# Patient Record
Sex: Male | Born: 1980
Health system: Southern US, Community
[De-identification: ages and names within clinical notes are randomized; demographics above are authoritative.]

## PROBLEM LIST (undated history)

## (undated) DIAGNOSIS — J45909 Unspecified asthma, uncomplicated: Secondary | ICD-10-CM

## (undated) DIAGNOSIS — T7840XA Allergy, unspecified, initial encounter: Secondary | ICD-10-CM

## (undated) HISTORY — PX: CYST REMOVAL TRUNK: SHX6283

## (undated) HISTORY — DX: Unspecified asthma, uncomplicated: J45.909

## (undated) HISTORY — DX: Allergy, unspecified, initial encounter: T78.40XA

---
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
## 1999-05-02 ENCOUNTER — Emergency Department (HOSPITAL_COMMUNITY): Admission: EM | Admit: 1999-05-02 | Discharge: 1999-05-02 | Payer: Self-pay | Admitting: *Deleted

## 2002-03-29 ENCOUNTER — Emergency Department (HOSPITAL_COMMUNITY): Admission: EM | Admit: 2002-03-29 | Discharge: 2002-03-29 | Payer: Self-pay | Admitting: *Deleted

## 2002-03-29 ENCOUNTER — Encounter: Payer: Self-pay | Admitting: Emergency Medicine

## 2006-04-19 ENCOUNTER — Encounter: Payer: Self-pay | Admitting: Emergency Medicine

## 2006-10-06 ENCOUNTER — Emergency Department (HOSPITAL_COMMUNITY): Admission: EM | Admit: 2006-10-06 | Discharge: 2006-10-06 | Payer: Self-pay | Admitting: Emergency Medicine

## 2008-05-20 ENCOUNTER — Emergency Department (HOSPITAL_COMMUNITY): Admission: EM | Admit: 2008-05-20 | Discharge: 2008-05-21 | Payer: Self-pay | Admitting: Emergency Medicine

## 2009-05-23 ENCOUNTER — Emergency Department (HOSPITAL_COMMUNITY): Admission: EM | Admit: 2009-05-23 | Discharge: 2009-05-24 | Payer: Self-pay | Admitting: Emergency Medicine

## 2013-12-25 ENCOUNTER — Ambulatory Visit: Payer: No Typology Code available for payment source | Admitting: Family Medicine

## 2013-12-25 VITALS — BP 120/80 | HR 79 | Temp 97.8°F | Resp 16 | Ht 66.75 in | Wt 225.0 lb

## 2013-12-25 DIAGNOSIS — J029 Acute pharyngitis, unspecified: Secondary | ICD-10-CM

## 2013-12-25 LAB — POCT CBC
Granulocyte percent: 76.6 %G (ref 37–80)
HCT, POC: 46.1 % (ref 43.5–53.7)
Hemoglobin: 15.1 g/dL (ref 14.1–18.1)
Lymph, poc: 2.6 (ref 0.6–3.4)
MCH, POC: 31.9 pg — AB (ref 27–31.2)
MCHC: 32.8 g/dL (ref 31.8–35.4)
MCV: 97.4 fL — AB (ref 80–97)
MID (cbc): 0.9 (ref 0–0.9)
MPV: 8.4 fL (ref 0–99.8)
POC Granulocyte: 11.5 — AB (ref 2–6.9)
POC LYMPH PERCENT: 17.4 %L (ref 10–50)
POC MID %: 6 %M (ref 0–12)
Platelet Count, POC: 226 10*3/uL (ref 142–424)
RBC: 4.73 M/uL (ref 4.69–6.13)
RDW, POC: 12.8 %
WBC: 15 10*3/uL — AB (ref 4.6–10.2)

## 2013-12-25 LAB — POCT RAPID STREP A (OFFICE): Rapid Strep A Screen: NEGATIVE

## 2013-12-25 MED ORDER — AMOXICILLIN 500 MG PO CAPS: 500.0000 mg | ORAL_CAPSULE | Freq: Three times a day (TID) | ORAL | Status: DC

## 2013-12-25 NOTE — Patient Instructions (Signed)
Sore Throat A sore throat is pain, burning, irritation, or scratchiness of the throat. There is often pain or tenderness when swallowing or talking. A sore throat may be accompanied by other symptoms, such as coughing, sneezing, fever, and swollen neck glands. A sore throat is often the first sign of another sickness, such as a cold, flu, strep throat, or mononucleosis (commonly known as mono). Most sore throats go away without medical treatment. CAUSES  The most common causes of a sore throat include:  A viral infection, such as a cold, flu, or mono.  A bacterial infection, such as strep throat, tonsillitis, or whooping cough.  Seasonal allergies.  Dryness in the air.  Irritants, such as smoke or pollution.  Gastroesophageal reflux disease (GERD). HOME CARE INSTRUCTIONS   Only take over-the-counter medicines as directed by your caregiver.  Drink enough fluids to keep your urine clear or pale yellow.  Rest as needed.  Try using throat sprays, lozenges, or sucking on hard candy to ease any pain (if older than 4 years or as directed).  Sip warm liquids, such as broth, herbal tea, or warm water with honey to relieve pain temporarily. You may also eat or drink cold or frozen liquids such as frozen ice pops.  Gargle with salt water (mix 1 tsp salt with 8 oz of water).  Do not smoke and avoid secondhand smoke.  Put a cool-mist humidifier in your bedroom at night to moisten the air. You can also turn on a hot shower and sit in the bathroom with the door closed for 5 10 minutes. SEEK IMMEDIATE MEDICAL CARE IF:  You have difficulty breathing.  You are unable to swallow fluids, soft foods, or your saliva.  You have increased swelling in the throat.  Your sore throat does not get better in 7 days.  You have nausea and vomiting.  You have a fever or persistent symptoms for more than 2 3 days.  You have a fever and your symptoms suddenly get worse. MAKE SURE YOU:   Understand  these instructions.  Will watch your condition.  Will get help right away if you are not doing well or get worse. Document Released: 01/12/2005 Document Revised: 11/21/2012 Document Reviewed: 08/12/2012 ExitCare Patient Information 2014 [employer].  

## 2013-12-25 NOTE — Progress Notes (Signed)
Subjective:    Patient ID: Mark Duarte, male    DOB: July 30, 1981, 33 y.o.   MRN: [id number]  Chief Complaint  Patient presents with  . Sore Throat    started today  . Fatigue  . Sinus Problem   HPI  This chart was scribed for [employer]. Katrinka Blazing, MD, by Ellin Mayhew, ED Scribe. This patient was seen in room 03 and the patient's care was started at 6:55 PM.  HPI Comments: Mark Duarte is a 33 y.o. male who presents to the [facility] complaining of sore throat diffusely with difficulty swallowing and speaking that began at 4:30AM this morning. Patient states feeling a "scratchy" sensation in his throat when attempting to speak. He reports associated congestion, myalgias, and HA. Patient denies any fever, chills, hot flashes, ear pain, nausea, vomiting, or diarrhea. He further denies any cough, SOB, or rhinorrhea. Patient has visited his parents recently who were sick the week before. Patient has a history of seasonal allergies; however, has not recently had a flare up and does not currently take any medications for his allergies. Patient took Mucinex with no relief. He is a former smoker.  Past Medical History  Diagnosis Date  . Asthma   . Allergy     Past Surgical History  Procedure Laterality Date  . Cyst removal trunk      Family History  Problem Relation Age of Onset  . Diabetes Mother   . Hypertension Mother   . High Cholesterol Mother   . Hypertension Father   . High Cholesterol Father   . Cancer Maternal Grandmother   . Heart disease Maternal Grandfather   . Diabetes Maternal Grandfather   . High Cholesterol Maternal Grandfather   . Stroke Maternal Grandfather   . Mental illness Maternal Grandfather     History   Social History  . Marital Status: Single    Spouse Name: N/A    Number of Children: N/A  . Years of Education: N/A   Occupational History  . Not on file.   Social History Main Topics  . Smoking status: Former Games developer  .  Smokeless tobacco: Not on file  . Alcohol Use: 2.5 oz/week    5 drink(s) per week  . Drug Use: No  . Sexual Activity: Not on file   Other Topics Concern  . Not on file   Social History Narrative  . No narrative on file   No Known Allergies  There are no active problems to display for this patient.  No current outpatient prescriptions on file prior to visit.   No current facility-administered medications on file prior to visit.    BP 120/80  Pulse 79  Temp(Src) 97.8 F (36.6 C) (Oral)  Resp 16  Ht 5' 6.75" (1.695 m)  Wt 225 lb (102.059 kg)  BMI 35.52 kg/m2  SpO2 97%  Review of Systems  Constitutional: Positive for fatigue. Negative for fever, chills, diaphoresis, activity change and appetite change.  HENT: Positive for congestion, sinus pressure, sore throat and trouble swallowing. Negative for ear pain.   Eyes: Negative.   Respiratory: Negative for cough and shortness of breath.   Gastrointestinal: Negative for nausea, vomiting, diarrhea and constipation.  Musculoskeletal: Positive for myalgias.  Skin: Negative.   Neurological: Positive for headaches. Negative for dizziness, weakness and light-headedness.   Objective:   Physical Exam  Nursing note and vitals reviewed. Constitutional: He is oriented to person, place, and time. He appears well-developed and well-nourished.  No distress.  HENT:  Head: Normocephalic and atraumatic.  Right Ear: Tympanic membrane and external ear normal.  Left Ear: Tympanic membrane and external ear normal.  Mouth/Throat: Posterior oropharyngeal erythema present. No oropharyngeal exudate.  Diffuse oropharyngeal erythema  Eyes: EOM are normal.  Neck: Neck supple. No tracheal deviation present.  Cardiovascular: Normal rate, regular rhythm and normal heart sounds.   Pulmonary/Chest: Effort normal and breath sounds normal. No respiratory distress.  Musculoskeletal: Normal range of motion.  Lymphadenopathy:    He has cervical adenopathy  (anterior and posterior).  Neurological: He is alert and oriented to person, place, and time.  Skin: Skin is warm and dry.  Psychiatric: He has a normal mood and affect. His behavior is normal.    Results for orders placed in visit on 12/25/13  POCT CBC      Result Value Range   WBC 15.0 (*) 4.6 - 10.2 K/uL   Lymph, poc 2.6  0.6 - 3.4   POC LYMPH PERCENT 17.4  10 - 50 %L   MID (cbc) 0.9  0 - 0.9   POC MID % 6.0  0 - 12 %M   POC Granulocyte 11.5 (*) 2 - 6.9   Granulocyte percent 76.6  37 - 80 %G   RBC 4.73  4.69 - 6.13 M/uL   Hemoglobin 15.1  14.1 - 18.1 g/dL   HCT, POC 45.446.1  09.843.5 - 53.7 %   MCV 97.4 (*) 80 - 97 fL   MCH, POC 31.9 (*) 27 - 31.2 pg   MCHC 32.8  31.8 - 35.4 g/dL   RDW, POC 11.912.8     Platelet Count, POC 226  142 - 424 K/uL   MPV 8.4  0 - 99.8 fL  POCT RAPID STREP A (OFFICE)      Result Value Range   Rapid Strep A Screen Negative  Negative    Assessment & Plan:  Acute pharyngitis - Plan: POCT CBC, POCT rapid strep A  1.  Sore throat:  New.  Treat with Amoxicillin 500mg  tid; recommend Ibuprofen 800mg  tid prn. RTC inability to swallow.  I personally performed the services described in this documentation, which was scribed in my presence.  The recorded information has been reviewed and is accurate.  Nilda SimmerKristi Smith, M.D.  [facility]  [facility] [address] [place], [place]  [address] [phone] phone [phone] fax

## 2014-03-11 ENCOUNTER — Ambulatory Visit (INDEPENDENT_AMBULATORY_CARE_PROVIDER_SITE_OTHER): Payer: No Typology Code available for payment source | Admitting: Family Medicine

## 2014-03-11 VITALS — BP 110/64 | HR 78 | Temp 97.7°F | Resp 18 | Ht 67.5 in | Wt 223.0 lb

## 2014-03-11 DIAGNOSIS — J029 Acute pharyngitis, unspecified: Secondary | ICD-10-CM

## 2014-03-11 DIAGNOSIS — J069 Acute upper respiratory infection, unspecified: Secondary | ICD-10-CM

## 2014-03-11 DIAGNOSIS — J309 Allergic rhinitis, unspecified: Secondary | ICD-10-CM

## 2014-03-11 LAB — POCT RAPID STREP A (OFFICE): Rapid Strep A Screen: NEGATIVE

## 2014-03-11 MED ORDER — FLUTICASONE PROPIONATE 50 MCG/ACT NA SUSP: 2.0000 | Freq: Every day | NASAL | Status: DC

## 2014-03-11 MED ORDER — PSEUDOEPHEDRINE-GUAIFENESIN ER 60-600 MG PO TB12: 1.0000 | ORAL_TABLET | Freq: Two times a day (BID) | ORAL | Status: DC

## 2014-03-11 NOTE — Progress Notes (Signed)
   Subjective:    Patient ID: Mark Duarte, male    DOB: 1981/01/06, 33 y.o.   MRN: [id number]  HPI Has 3 day history of nasal congestion, sinus pressure. Ears are stopped up and sore throat. Chest feels heavy with deep breath. Took Allegra D with short term relief. Girlfriend who is a Runner, broadcasting/film/videoteacher is also being seen as a patient today with similar symptoms.  Has history of seasonal allergies and childhood asthma. Has not been bothered by asthma symptoms in many years. Quit smoking 6 months ago.    Review of Systems No cough. No fever. No chest pain, no shortness of breath.      Objective:   Physical Exam  Vitals reviewed. Constitutional: He is oriented to person, place, and time. He appears well-developed and well-nourished. No distress.  HENT:  Head: Normocephalic and atraumatic.  Right Ear: Tympanic membrane, external ear and ear canal normal.  Left Ear: Tympanic membrane, external ear and ear canal normal.  Nose: Mucosal edema, rhinorrhea and septal deviation present. Right sinus exhibits no maxillary sinus tenderness and no frontal sinus tenderness. Left sinus exhibits no maxillary sinus tenderness and no frontal sinus tenderness.  Mouth/Throat: Uvula is midline and mucous membranes are normal. Posterior oropharyngeal erythema present. No oropharyngeal exudate, posterior oropharyngeal edema or tonsillar abscesses.  Eyes: Conjunctivae are normal. Right eye exhibits no discharge. Left eye exhibits no discharge. No scleral icterus.  Neck: Normal range of motion. Neck supple.  Cardiovascular: Normal rate, regular rhythm and normal heart sounds.   Pulmonary/Chest: Effort normal and breath sounds normal.  Musculoskeletal: Normal range of motion.  Neurological: He is alert and oriented to person, place, and time.  Skin: Skin is warm and dry. He is not diaphoretic.  Psychiatric: He has a normal mood and affect. His behavior is normal. Judgment and thought content normal.   Results for  orders placed in visit on 03/11/14  POCT RAPID STREP A (OFFICE)      Result Value Ref Range   Rapid Strep A Screen Negative  Negative      Assessment & Plan:  1. Sore throat - POCT rapid strep A- negative - Throat culture ([facility]) -ibuprofen 600 mg q8 hours prn 2. Upper respiratory infection - Throat culture ([facility]) - pseudoephedrine-guaifenesin (MUCINEX D) 60-600 MG per tablet; Take 1 tablet by mouth every 12 (twelve) hours.  Dispense: 28 tablet; Refill: 1  3. Allergic rhinitis - fluticasone (FLONASE) 50 MCG/ACT nasal spray; Place 2 sprays into both nostrils daily.  Dispense: 16 g; Refill: 3  -Written and verbal instructions provided regarding negative strep test, diagnoses, symptomatig  Emi Belfasteborah B. Delontae Lamm, FNP-BC  [facility], [facility]  03/11/2014 12:04 PM   Patient discussed with Ms. Leone PayorGessner, NP agree with above.    Eula Listenyan Dunn, MHS, PA-C [facility] [address] [place], [place] [address] [phone] [facility] 03/11/2014 1:10 PM

## 2014-03-11 NOTE — Patient Instructions (Signed)
Ibuprofen 400 mg every 8 hours as needed for pain Flonase nasal spray- 2 sprays in each nostril twice a day for 1 week then once a day during your allergy season Mucinex D every 12 hours for congestion Allergic Rhinitis Allergic rhinitis is when the mucous membranes in the nose respond to allergens. Allergens are particles in the air that cause your body to have an allergic reaction. This causes you to release allergic antibodies. Through a chain of events, these eventually cause you to release histamine into the blood stream. Although meant to protect the body, it is this release of histamine that causes your discomfort, such as frequent sneezing, congestion, and an itchy, runny nose.  CAUSES  Seasonal allergic rhinitis (hay fever) is caused by pollen allergens that may come from grasses, trees, and weeds. Year-round allergic rhinitis (perennial allergic rhinitis) is caused by allergens such as house dust mites, pet dander, and mold spores.  SYMPTOMS   Nasal stuffiness (congestion).  Itchy, runny nose with sneezing and tearing of the eyes. DIAGNOSIS  Your health care provider can help you determine the allergen or allergens that trigger your symptoms. If you and your health care provider are unable to determine the allergen, skin or blood testing may be used. TREATMENT  Allergic Rhinitis does not have a cure, but it can be controlled by:  Medicines and allergy shots (immunotherapy).  Avoiding the allergen. Hay fever may often be treated with antihistamines in pill or nasal spray forms. Antihistamines block the effects of histamine. There are over-the-counter medicines that may help with nasal congestion and swelling around the eyes. Check with your health care provider before taking or giving this medicine.  If avoiding the allergen or the medicine prescribed do not work, there are many new medicines your health care provider can prescribe. Stronger medicine may be used if initial measures are  ineffective. Desensitizing injections can be used if medicine and avoidance does not work. Desensitization is when a patient is given ongoing shots until the body becomes less sensitive to the allergen. Make sure you follow up with your health care provider if problems continue. HOME CARE INSTRUCTIONS It is not possible to completely avoid allergens, but you can reduce your symptoms by taking steps to limit your exposure to them. It helps to know exactly what you are allergic to so that you can avoid your specific triggers. SEEK MEDICAL CARE IF:   You have a fever.  You develop a cough that does not stop easily (persistent).  You have shortness of breath.  You start wheezing.  Symptoms interfere with normal daily activities. Document Released: 08/30/2001 Document Revised: 09/25/2013 Document Reviewed: 08/12/2013 [facility] Patient Information 2014 [place], [place].

## 2014-03-13 LAB — CULTURE, GROUP A STREP: Organism ID, Bacteria: NORMAL

## 2014-07-29 ENCOUNTER — Encounter (HOSPITAL_COMMUNITY): Payer: Self-pay | Admitting: Emergency Medicine

## 2014-07-29 ENCOUNTER — Emergency Department (HOSPITAL_COMMUNITY)
Admission: EM | Admit: 2014-07-29 | Discharge: 2014-07-29 | Disposition: A | Discharge status: Home / Self Care | Payer: No Typology Code available for payment source | Attending: Emergency Medicine | Admitting: Emergency Medicine

## 2014-07-29 ENCOUNTER — Emergency Department (HOSPITAL_COMMUNITY): Payer: No Typology Code available for payment source

## 2014-07-29 DIAGNOSIS — S93409A Sprain of unspecified ligament of unspecified ankle, initial encounter: Secondary | ICD-10-CM | POA: Diagnosis not present

## 2014-07-29 DIAGNOSIS — IMO0002 Reserved for concepts with insufficient information to code with codable children: Secondary | ICD-10-CM | POA: Insufficient documentation

## 2014-07-29 DIAGNOSIS — J45909 Unspecified asthma, uncomplicated: Secondary | ICD-10-CM | POA: Diagnosis not present

## 2014-07-29 DIAGNOSIS — S8990XA Unspecified injury of unspecified lower leg, initial encounter: Secondary | ICD-10-CM | POA: Insufficient documentation

## 2014-07-29 DIAGNOSIS — S99929A Unspecified injury of unspecified foot, initial encounter: Secondary | ICD-10-CM

## 2014-07-29 DIAGNOSIS — Y92009 Unspecified place in unspecified non-institutional (private) residence as the place of occurrence of the external cause: Secondary | ICD-10-CM | POA: Diagnosis not present

## 2014-07-29 DIAGNOSIS — S93401A Sprain of unspecified ligament of right ankle, initial encounter: Secondary | ICD-10-CM

## 2014-07-29 DIAGNOSIS — S99919A Unspecified injury of unspecified ankle, initial encounter: Secondary | ICD-10-CM | POA: Diagnosis present

## 2014-07-29 DIAGNOSIS — Z87891 Personal history of nicotine dependence: Secondary | ICD-10-CM | POA: Insufficient documentation

## 2014-07-29 DIAGNOSIS — Y9301 Activity, walking, marching and hiking: Secondary | ICD-10-CM | POA: Diagnosis not present

## 2014-07-29 DIAGNOSIS — W172XXA Fall into hole, initial encounter: Secondary | ICD-10-CM | POA: Diagnosis not present

## 2014-07-29 MED ORDER — HYDROCODONE-ACETAMINOPHEN 5-325 MG PO TABS
1.0000 | ORAL_TABLET | Freq: Once | ORAL | Status: AC
  Administered 2014-07-29: 1 via ORAL
  Filled 2014-07-29: qty 1

## 2014-07-29 MED ORDER — MELOXICAM 15 MG PO TABS: 15.0000 mg | ORAL_TABLET | Freq: Every day | ORAL | Status: DC

## 2014-07-29 NOTE — Discharge Instructions (Signed)
Your x-rays today do not show any new program bones or other concerning injury. At this time your providers feel you have an ankle sprain. Use rest, ice, compression and elevation to reduce pain and swelling. Please followup with a primary care provider or an orthopedic specialist for continued evaluation and treatment.   Ankle Sprain An ankle sprain is an injury to the strong, fibrous tissues (ligaments) that hold the bones of your ankle joint together.  CAUSES An ankle sprain is usually caused by a fall or by twisting your ankle. Ankle sprains most commonly occur when you step on the outer edge of your foot, and your ankle turns inward. People who participate in sports are more prone to these types of injuries.  SYMPTOMS   Pain in your ankle. The pain may be present at rest or only when you are trying to stand or walk.  Swelling.  Bruising. Bruising may develop immediately or within 1 to 2 days after your injury.  Difficulty standing or walking, particularly when turning corners or changing directions. DIAGNOSIS  Your caregiver will ask you details about your injury and perform a physical exam of your ankle to determine if you have an ankle sprain. During the physical exam, your caregiver will press on and apply pressure to specific areas of your foot and ankle. Your caregiver will try to move your ankle in certain ways. An X-ray exam may be done to be sure a bone was not broken or a ligament did not separate from one of the bones in your ankle (avulsion fracture).  TREATMENT  Certain types of braces can help stabilize your ankle. Your caregiver can make a recommendation for this. Your caregiver may recommend the use of medicine for pain. If your sprain is severe, your caregiver may refer you to a surgeon who helps to restore function to parts of your skeletal system (orthopedist) or a physical therapist. HOME CARE INSTRUCTIONS   Apply ice to your injury for 1-2 days or as directed by your  caregiver. Applying ice helps to reduce inflammation and pain.  Put ice in a plastic bag.  Place a towel between your skin and the bag.  Leave the ice on for 15-20 minutes at a time, every 2 hours while you are awake.  Only take over-the-counter or prescription medicines for pain, discomfort, or fever as directed by your caregiver.  Elevate your injured ankle above the level of your heart as much as possible for 2-3 days.  If your caregiver recommends crutches, use them as instructed. Gradually put weight on the affected ankle. Continue to use crutches or a cane until you can walk without feeling pain in your ankle.  If you have a plaster splint, wear the splint as directed by your caregiver. Do not rest it on anything harder than a pillow for the first 24 hours. Do not put weight on it. Do not get it wet. You may take it off to take a shower or bath.  You may have been given an elastic bandage to wear around your ankle to provide support. If the elastic bandage is too tight (you have numbness or tingling in your foot or your foot becomes cold and blue), adjust the bandage to make it comfortable.  If you have an air splint, you may blow more air into it or let air out to make it more comfortable. You may take your splint off at night and before taking a shower or bath. Wiggle your toes in the  splint several times per day to decrease swelling. SEEK MEDICAL CARE IF:   You have rapidly increasing bruising or swelling.  Your toes feel extremely cold or you lose feeling in your foot.  Your pain is not relieved with medicine. SEEK IMMEDIATE MEDICAL CARE IF:  Your toes are numb or blue.  You have severe pain that is increasing. MAKE SURE YOU:   Understand these instructions.  Will watch your condition.  Will get help right away if you are not doing well or get worse. Document Released: 12/05/2005 Document Revised: 08/29/2012 Document Reviewed: 12/17/2011 [facility] Patient Information  2015 [place], [place]. This information is not intended to replace advice given to you by your health care provider. Make sure you discuss any questions you have with your health care provider.

## 2014-07-29 NOTE — ED Notes (Signed)
Pt states he stepped in a hole in his yard and injured his R ankle. Pt has swelling to lateral side of R ankle. Pt states he is unable to bear wt to R ankle. Pt arrives in wheelchair.

## 2014-07-29 NOTE — ED Provider Notes (Signed)
CSN: [id number]     Arrival date & time 07/29/14  2148 History  This chart was scribed for non-physician practitioner working with Suzi Roots, MD by Elveria Rising, ED Scribe. This patient was seen in room WTR7/WTR7 and the patient's care was started at 10:17 PM.   Chief Complaint  Patient presents with  . Ankle Injury    The history is provided by the patient. No language interpreter was used.   HPI Comments: Mark Duarte is a 33 y.o. male who presents to the Emergency Department with a right ankle injury incurred tonight. Injury sustained while walking through his yard, stepping in a hole and rolling his ankle inward. Patient reports audible pop. Patient complaining of the right ankle swelling and pain with radiation to his right knee. Patient reports mild pain at rest, rating it 5/10, but reports exacerbation with bearing weight. Patient reports treatment with ice prior to arrival.   Patient shares several past (right ankle) injuries but nothing severe enough for surgery.    Past Medical History  Diagnosis Date  . Asthma   . Allergy    Past Surgical History  Procedure Laterality Date  . Cyst removal trunk     Family History  Problem Relation Age of Onset  . Diabetes Mother   . Hypertension Mother   . High Cholesterol Mother   . Hypertension Father   . High Cholesterol Father   . Cancer Maternal Grandmother   . Heart disease Maternal Grandfather   . Diabetes Maternal Grandfather   . High Cholesterol Maternal Grandfather   . Stroke Maternal Grandfather   . Mental illness Maternal Grandfather    History  Substance Use Topics  . Smoking status: Former Smoker    Quit date: 10/11/2013  . Smokeless tobacco: Not on file  . Alcohol Use: 2.5 oz/week    5 drink(s) per week    Review of Systems  Constitutional: Negative for fever and chills.  Musculoskeletal: Positive for arthralgias and joint swelling.  All other systems reviewed and are negative.   Allergies   Review of patient's allergies indicates no known allergies.  Home Medications   Prior to Admission medications   Medication Sig Start Date End Date Taking? Authorizing Provider  fluticasone (FLONASE) 50 MCG/ACT nasal spray Place 2 sprays into both nostrils daily. 03/11/14   Emi Belfast, FNP  pseudoephedrine-guaifenesin [facility] D) 60-600 MG per tablet Take 1 tablet by mouth every 12 (twelve) hours. 03/11/14   Emi Belfast, FNP   Triage Vitals: BP 137/68  Pulse 90  Temp(Src) 98.8 F (37.1 C) (Oral)  Resp 17  SpO2 96%  Physical Exam  Nursing note and vitals reviewed. Constitutional: He is oriented to person, place, and time. He appears well-developed and well-nourished.  HENT:  Head: Normocephalic and atraumatic.  Eyes: EOM are normal.  Neck: Neck supple.  Cardiovascular: Normal rate.   Pulmonary/Chest: Effort normal. No respiratory distress.  Musculoskeletal: He exhibits edema and tenderness.  Significant swelling over the right lateral malleolus area and distal fibula. There is tenderness throughout. No obvious gross deformity. No pain over the proximal fifth metatarsal. Normal dorsal pedal pulses and sensation in the foot. No proximal fibular tenderness and normal knee exam.  Neurological: He is alert and oriented to person, place, and time.  Skin: Skin is warm and dry.  Psychiatric: He has a normal mood and affect. His behavior is normal.    ED Course  Procedures  COORDINATION OF CARE: 10:41 PM- Will order  pain medication. Awaiting reading from radiologist. Discussed treatment plan with patient at bedside and patient agreed to plan.    X-rays reviewed. No signs of acute fracture or dislocation. Signs of old remote old and fracture of the distal fibula. Patient does report a history of prior injuries. Do not suspect this is acute. We'll plan to treat with ASO and crutches. Orthopedic referral provided.   Imaging Review Dg Ankle Complete Right  07/29/2014    CLINICAL DATA:  History of trauma from fall.  Right ankle pain.  EXAM: RIGHT ANKLE - COMPLETE 3+ VIEW  COMPARISON:  No priors.  FINDINGS: Three views of the right ankle demonstrate extensive soft tissue swelling overlying the lateral malleolus. There is a small osseous fragment adjacent to the tip of the lateral malleolus, which appears generally well corticated. No other evidence to suggest acute fracture, subluxation or dislocation is noted.  IMPRESSION: 1. Extensive soft tissue swelling overlying the lateral malleolus. 2. Small osseous fragment adjacent to the tip of the lateral malleolus. Since this is well corticated, this is favored to represent sequela of remote old avulsion fracture, however, given the extensive overlying soft tissue swelling, the possibility of an acute avulsion fracture is not excluded.   Electronically Signed   By: Trudie Reedaniel  Entrikin M.D.   On: 07/29/2014 23:05     MDM   Final diagnoses:  Ankle sprain, right, initial encounter      I personally performed the services described in this documentation, which was scribed in my presence. The recorded information has been reviewed and is accurate.    Angus SellerPeter S Daniesha Driver, PA-C 07/29/14 2317

## 2014-07-30 ENCOUNTER — Telehealth (HOSPITAL_BASED_OUTPATIENT_CLINIC_OR_DEPARTMENT_OTHER): Payer: Self-pay | Admitting: Emergency Medicine

## 2014-08-05 NOTE — ED Provider Notes (Signed)
Medical screening examination/treatment/procedure(s) were performed by non-physician practitioner and as supervising physician I was immediately available for consultation/collaboration.     Suzi RootsKevin E Serigne Kubicek, MD 08/05/14 [phone]

## 2014-12-13 ENCOUNTER — Emergency Department (HOSPITAL_COMMUNITY)
Admission: EM | Admit: 2014-12-13 | Discharge: 2014-12-13 | Disposition: A | Discharge status: Home / Self Care | Payer: No Typology Code available for payment source | Source: Home / Self Care | Attending: Family Medicine | Admitting: Family Medicine

## 2014-12-13 ENCOUNTER — Encounter (HOSPITAL_COMMUNITY): Payer: Self-pay | Admitting: Emergency Medicine

## 2014-12-13 DIAGNOSIS — J9801 Acute bronchospasm: Secondary | ICD-10-CM

## 2014-12-13 DIAGNOSIS — J0100 Acute maxillary sinusitis, unspecified: Secondary | ICD-10-CM

## 2014-12-13 DIAGNOSIS — J029 Acute pharyngitis, unspecified: Secondary | ICD-10-CM

## 2014-12-13 LAB — POCT RAPID STREP A: STREPTOCOCCUS, GROUP A SCREEN (DIRECT): NEGATIVE

## 2014-12-13 MED ORDER — FLUTICASONE PROPIONATE 50 MCG/ACT NA SUSP: 2.0000 | Freq: Every day | NASAL | Status: DC

## 2014-12-13 MED ORDER — ALBUTEROL SULFATE HFA 108 (90 BASE) MCG/ACT IN AERS: 2.0000 | INHALATION_SPRAY | Freq: Four times a day (QID) | RESPIRATORY_TRACT | Status: DC | PRN

## 2014-12-13 MED ORDER — IPRATROPIUM BROMIDE 0.06 % NA SOLN: 2.0000 | Freq: Four times a day (QID) | NASAL | Status: DC

## 2014-12-13 MED ORDER — AMOXICILLIN-POT CLAVULANATE 875-125 MG PO TABS: 1.0000 | ORAL_TABLET | Freq: Two times a day (BID) | ORAL | Status: DC

## 2014-12-13 MED ORDER — PREDNISONE 50 MG PO TABS: ORAL_TABLET | ORAL | Status: DC

## 2014-12-13 NOTE — ED Notes (Signed)
Pt states that he has body aches with sore throat that started this morning.

## 2014-12-13 NOTE — ED Provider Notes (Signed)
CSN: [id number]     Arrival date & time 12/13/14  1857 History   First MD Initiated Contact with Patient 12/13/14 2000     Chief Complaint  Patient presents with  . Sore Throat  . Generalized Body Aches   (Consider location/radiation/quality/duration/timing/severity/associated sxs/prior Treatment) HPI   Sore throat. Started last night. Acutely worsened today. Associated w/ aches and chills. Denies nasal congestion. Getting worse. Denies fevers. Has not taken any medications. Multiple possible sick contacts. Decreased PO due to pain.   Past Medical History  Diagnosis Date  . Asthma   . Allergy    Past Surgical History  Procedure Laterality Date  . Cyst removal trunk     Family History  Problem Relation Age of Onset  . Diabetes Mother   . Hypertension Mother   . High Cholesterol Mother   . Hypertension Father   . High Cholesterol Father   . Cancer Maternal Grandmother   . Heart disease Maternal Grandfather   . Diabetes Maternal Grandfather   . High Cholesterol Maternal Grandfather   . Stroke Maternal Grandfather   . Mental illness Maternal Grandfather    History  Substance Use Topics  . Smoking status: Former Smoker    Quit date: 10/11/2013  . Smokeless tobacco: Not on file  . Alcohol Use: 2.5 oz/week    5 Not specified per week    Review of Systems Per HPI with all other pertinent systems negative.   Allergies  Review of patient's allergies indicates no known allergies.  Home Medications   Prior to Admission medications   Medication Sig Start Date End Date Taking? Authorizing Provider  amoxicillin-clavulanate (AUGMENTIN) 875-125 MG per tablet Take 1 tablet by mouth 2 (two) times daily. 12/13/14   Ozella Rocksavid J Merrell, MD  fluticasone (FLONASE) 50 MCG/ACT nasal spray Place 2 sprays into both nostrils at bedtime. 12/13/14   Ozella Rocksavid J Merrell, MD  ibuprofen (ADVIL,MOTRIN) 200 MG tablet Take 400 mg by mouth every 6 (six) hours as needed for moderate pain.    Historical  Provider, MD  ipratropium (ATROVENT) 0.06 % nasal spray Place 2 sprays into both nostrils 4 (four) times daily. 12/13/14   Ozella Rocksavid J Merrell, MD  meloxicam (MOBIC) 15 MG tablet Take 1 tablet (15 mg total) by mouth daily. 07/29/14   Angus SellerPeter S Dammen, PA-C  Multiple Vitamin (MULTIVITAMIN WITH MINERALS) TABS tablet Take 1 tablet by mouth daily.    Historical Provider, MD  predniSONE (DELTASONE) 50 MG tablet Take daily with breakfast 12/13/14   Ozella Rocksavid J Merrell, MD   BP 116/76 mmHg  Pulse 96  Temp(Src) 98.3 F (36.8 C) (Oral)  Resp 16  SpO2 100% Physical Exam  Constitutional: He is oriented to person, place, and time. He appears well-developed and well-nourished. No distress.  HENT:  Head: Normocephalic and atraumatic.  Tonsils 1+ w/o exudate.  Maxillary sinuses ttp Pharyngeal cobblestoning abnd injection  Eyes: EOM are normal. Pupils are equal, round, and reactive to light.  Neck: Normal range of motion.  Cardiovascular: Normal rate and regular rhythm.   Pulmonary/Chest: Effort normal.  Abdominal: Soft. Bowel sounds are normal.  Musculoskeletal: Normal range of motion. He exhibits no edema.  Neurological: He is alert and oriented to person, place, and time. No cranial nerve deficit.  Skin: Skin is warm and dry. He is not diaphoretic.  Psychiatric: He has a normal mood and affect. His behavior is normal. Judgment and thought content normal.    ED Course  Procedures (including critical care time) Labs  Review Labs Reviewed  POCT RAPID STREP A ([facility] CARE ONLY)    Imaging Review No results found.   MDM   1. Sore throat   2. Bronchospasm   3. Subacute maxillary sinusitis    Rapid strep negative Maxillary sinuses tender concerning for sinusitis Symptoms more concerning for strep but cannot r/o viral.  Treat w/ atrovent, flonase, ibuprofen 600-800, throat lozenges. If not improvgin or worsens w/ fevers, pt to start AUgmentin Start albuterol for mild pleuritic pain and occasional  wheeze.  Albuterol refilled Precautions given and all questions answered   Shelly Flattenavid Merrell, MD Family Medicine 12/13/2014, 8:33 PM      Ozella Rocksavid J Merrell, MD 12/13/14 2033

## 2014-12-13 NOTE — Discharge Instructions (Signed)
Your sore throat is likely from a viral infection but may be bacterial Please start the nasal atrovent and flonase Please start iburofen 600-800mg  every 6-8 hours for pain and swelling Please augmentin for a sinusitis infection if you are not getting better.

## 2014-12-15 LAB — CULTURE, GROUP A STREP

## 2015-04-19 ENCOUNTER — Ambulatory Visit (INDEPENDENT_AMBULATORY_CARE_PROVIDER_SITE_OTHER): Payer: No Typology Code available for payment source | Admitting: Family Medicine

## 2015-04-19 VITALS — BP 126/62 | HR 100 | Temp 98.6°F | Resp 20 | Ht 67.0 in | Wt 221.0 lb

## 2015-04-19 DIAGNOSIS — R509 Fever, unspecified: Secondary | ICD-10-CM

## 2015-04-19 DIAGNOSIS — B349 Viral infection, unspecified: Secondary | ICD-10-CM

## 2015-04-19 LAB — POCT INFLUENZA A/B
Influenza A, POC: NEGATIVE
Influenza B, POC: NEGATIVE

## 2015-04-19 MED ORDER — HYDROCOD POLST-CPM POLST ER 10-8 MG/5ML PO SUER: 5.0000 mL | Freq: Two times a day (BID) | ORAL | Status: DC

## 2015-04-19 MED ORDER — OSELTAMIVIR PHOSPHATE 75 MG PO CAPS: 75.0000 mg | ORAL_CAPSULE | Freq: Two times a day (BID) | ORAL | Status: DC

## 2015-04-19 MED ORDER — IBUPROFEN 800 MG PO TABS: 800.0000 mg | ORAL_TABLET | Freq: Three times a day (TID) | ORAL | Status: DC | PRN

## 2015-04-19 MED ORDER — ACETAMINOPHEN 325 MG PO TABS
1000.0000 mg | ORAL_TABLET | Freq: Once | ORAL | Status: AC
  Administered 2015-04-19: 975 mg via ORAL

## 2015-04-19 NOTE — Progress Notes (Signed)
Subjective:    Patient ID: Mark Duarte, male    DOB: 17-Sep-1981, 34 y.o.   MRN: [id number] Chief Complaint  Patient presents with  . Fatigue    several days--hands and feet are numb, feels very cold and weak    HPI   Dev f/c 2d prior but severely worsened o/n.  Did not get flu shot this year. No known sick contacts.   Past Medical History  Diagnosis Date  . Asthma   . Allergy    Current Outpatient Prescriptions on File Prior to Visit  Medication Sig Dispense Refill  . Multiple Vitamin (MULTIVITAMIN WITH MINERALS) TABS tablet Take 1 tablet by mouth daily.    Marland Kitchen. albuterol (PROVENTIL HFA;VENTOLIN HFA) 108 (90 BASE) MCG/ACT inhaler Inhale 2 puffs into the lungs every 6 (six) hours as needed for wheezing or shortness of breath. (Patient not taking: Reported on 04/19/2015) 1 Inhaler 2  . amoxicillin-clavulanate (AUGMENTIN) 875-125 MG per tablet Take 1 tablet by mouth 2 (two) times daily. (Patient not taking: Reported on 04/19/2015) 20 tablet 0  . fluticasone (FLONASE) 50 MCG/ACT nasal spray Place 2 sprays into both nostrils at bedtime. (Patient not taking: Reported on 04/19/2015) 16 g 0  . ibuprofen (ADVIL,MOTRIN) 200 MG tablet Take 400 mg by mouth every 6 (six) hours as needed for moderate pain.    Marland Kitchen. ipratropium (ATROVENT) 0.06 % nasal spray Place 2 sprays into both nostrils 4 (four) times daily. (Patient not taking: Reported on 04/19/2015) 15 mL 12  . meloxicam (MOBIC) 15 MG tablet Take 1 tablet (15 mg total) by mouth daily. (Patient not taking: Reported on 04/19/2015) 20 tablet 0  . predniSONE (DELTASONE) 50 MG tablet Take daily with breakfast (Patient not taking: Reported on 04/19/2015) 5 tablet 0   No current facility-administered medications on file prior to visit.   No Known Allergies   Review of Systems  Constitutional: Positive for fever, chills, diaphoresis, activity change, appetite change and fatigue. Negative for unexpected weight change.  HENT: Positive for congestion, sinus  pressure and sore throat. Negative for ear pain.   Respiratory: Positive for cough. Negative for shortness of breath.   Cardiovascular: Negative for chest pain.  Gastrointestinal: Positive for nausea. Negative for vomiting, abdominal pain, diarrhea and constipation.  Genitourinary: Negative for dysuria.  Musculoskeletal: Positive for myalgias and arthralgias. Negative for joint swelling, gait problem and neck stiffness.  Skin: Positive for color change. Negative for rash.  Neurological: Positive for numbness and headaches. Negative for syncope.  Hematological: Positive for adenopathy.  Psychiatric/Behavioral: Negative for sleep disturbance.  All other systems reviewed and are negative.      Objective:  BP 126/62 mmHg  Pulse 100  Temp(Src) 98.6 F (37 C) (Oral)  Resp 20  Ht 5\' 7"  (1.702 m)  Wt 221 lb (100.245 kg)  BMI 34.61 kg/m2  SpO2 98%  Physical Exam  Constitutional: He is oriented to person, place, and time. He appears well-developed and well-nourished. He appears lethargic. He appears ill. No distress.  HENT:  Head: Normocephalic and atraumatic.  Right Ear: Tympanic membrane, external ear and ear canal normal.  Left Ear: Tympanic membrane, external ear and ear canal normal.  Nose: Nose normal.  Mouth/Throat: Oropharynx is clear and moist and mucous membranes are normal. No oropharyngeal exudate.  Eyes: Conjunctivae are normal. No scleral icterus.  Neck: Normal range of motion. Neck supple. No thyromegaly present.  Cardiovascular: Normal rate, regular rhythm, normal heart sounds and intact distal pulses.   Pulmonary/Chest: Effort normal  and breath sounds normal. No respiratory distress.  Abdominal: Soft. Bowel sounds are normal. He exhibits no distension and no mass. There is no tenderness. There is no rebound and no guarding.  Musculoskeletal: He exhibits no edema.  Lymphadenopathy:    He has no cervical adenopathy.  Neurological: He is oriented to person, place, and  time. He appears lethargic.  Skin: Skin is warm and dry. He is not diaphoretic. No erythema.  Psychiatric: He has a normal mood and affect. His behavior is normal.   Results for orders placed or performed in visit on 04/19/15  POCT Influenza A/B  Result Value Ref Range   Influenza A, POC Negative    Influenza B, POC Negative        Assessment & Plan:   1. Fever, unspecified fever cause   2. Acute viral syndrome     Orders Placed This Encounter  Procedures  . POCT Influenza A/B    Meds ordered this encounter  Medications  . acetaminophen (TYLENOL) tablet 975 mg    Sig:   . oseltamivir (TAMIFLU) 75 MG capsule    Sig: Take 1 capsule (75 mg total) by mouth 2 (two) times daily.    Dispense:  10 capsule    Refill:  0  . chlorpheniramine-HYDROcodone (TUSSIONEX PENNKINETIC ER) 10-8 MG/5ML SUER    Sig: Take 5 mLs by mouth 2 (two) times daily.    Dispense:  120 mL    Refill:  0  . ibuprofen (ADVIL,MOTRIN) 800 MG tablet    Sig: Take 1 tablet (800 mg total) by mouth every 8 (eight) hours as needed.    Dispense:  30 tablet    Refill:  0    I personally performed the services described in this documentation, which was scribed in my presence. The recorded information has been reviewed and considered, and addended by me as needed.  Norberto Sorenson, MD MPH

## 2015-04-19 NOTE — Patient Instructions (Signed)
Dehydration, Adult Dehydration is when you lose more fluids from the body than you take in. Vital organs like the kidneys, brain, and heart cannot function without a proper amount of fluids and salt. Any loss of fluids from the body can cause dehydration.  CAUSES   Vomiting.  Diarrhea.  Excessive sweating.  Excessive urine output.  Fever. SYMPTOMS  Mild dehydration  Thirst.  Dry lips.  Slightly dry mouth. Moderate dehydration  Very dry mouth.  Sunken eyes.  Skin does not bounce back quickly when lightly pinched and released.  Dark urine and decreased urine production.  Decreased tear production.  Headache. Severe dehydration  Very dry mouth.  Extreme thirst.  Rapid, weak pulse (more than 100 beats per minute at rest).  Cold hands and feet.  Not able to sweat in spite of heat and temperature.  Rapid breathing.  Blue lips.  Confusion and lethargy.  Difficulty being awakened.  Minimal urine production.  No tears. DIAGNOSIS  Your caregiver will diagnose dehydration based on your symptoms and your exam. Blood and urine tests will help confirm the diagnosis. The diagnostic evaluation should also identify the cause of dehydration. TREATMENT  Treatment of mild or moderate dehydration can often be done at home by increasing the amount of fluids that you drink. It is best to drink small amounts of fluid more often. Drinking too much at one time can make vomiting worse. Refer to the home care instructions below. Severe dehydration needs to be treated at the hospital where you will probably be given intravenous (IV) fluids that contain water and electrolytes. HOME CARE INSTRUCTIONS   Ask your caregiver about specific rehydration instructions.  Drink enough fluids to keep your urine clear or pale yellow.  Drink small amounts frequently if you have nausea and vomiting.  Eat as you normally do.  Avoid:  Foods or drinks high in sugar.  Carbonated  drinks.  Juice.  Extremely hot or cold fluids.  Drinks with caffeine.  Fatty, greasy foods.  Alcohol.  Tobacco.  Overeating.  Gelatin desserts.  Wash your hands well to avoid spreading bacteria and viruses.  Only take over-the-counter or prescription medicines for pain, discomfort, or fever as directed by your caregiver.  Ask your caregiver if you should continue all prescribed and over-the-counter medicines.  Keep all follow-up appointments with your caregiver. SEEK MEDICAL CARE IF:  You have abdominal pain and it increases or stays in one area (localizes).  You have a rash, stiff neck, or severe headache.  You are irritable, sleepy, or difficult to awaken.  You are weak, dizzy, or extremely thirsty. SEEK IMMEDIATE MEDICAL CARE IF:   You are unable to keep fluids down or you get worse despite treatment.  You have frequent episodes of vomiting or diarrhea.  You have blood or green matter (bile) in your vomit.  You have blood in your stool or your stool looks black and tarry.  You have not urinated in 6 to 8 hours, or you have only urinated a small amount of very dark urine.  You have a fever.  You faint. MAKE SURE YOU:   Understand these instructions.  Will watch your condition.  Will get help right away if you are not doing well or get worse. Document Released: 12/05/2005 Document Revised: 02/27/2012 Document Reviewed: 07/25/2011 ExitCare Patient Information 2015 [employer]. This information is not intended to replace advice given to you by your health care provider. Make sure you discuss any questions you have with your health care   provider.  Influenza Influenza ("the flu") is a viral infection of the respiratory tract. It occurs more often in winter months because people spend more time in close contact with one another. Influenza can make you feel very sick. Influenza easily spreads from person to person (contagious). CAUSES  Influenza is caused  by a virus that infects the respiratory tract. You can catch the virus by breathing in droplets from an infected person's cough or sneeze. You can also catch the virus by touching something that was recently contaminated with the virus and then touching your mouth, nose, or eyes. RISKS AND COMPLICATIONS You may be at risk for a more severe case of influenza if you smoke cigarettes, have diabetes, have chronic heart disease (such as heart failure) or lung disease (such as asthma), or if you have a weakened immune system. Elderly people and pregnant women are also at risk for more serious infections. The most common problem of influenza is a lung infection (pneumonia). Sometimes, this problem can require emergency medical care and may be life threatening. SIGNS AND SYMPTOMS  Symptoms typically last 4 to 10 days and may include:  Fever.  Chills.  Headache, body aches, and muscle aches.  Sore throat.  Chest discomfort and cough.  Poor appetite.  Weakness or feeling tired.  Dizziness.  Nausea or vomiting. DIAGNOSIS  Diagnosis of influenza is often made based on your history and a physical exam. A nose or throat swab test can be done to confirm the diagnosis. TREATMENT  In mild cases, influenza goes away on its own. Treatment is directed at relieving symptoms. For more severe cases, your health care provider may prescribe antiviral medicines to shorten the sickness. Antibiotic medicines are not effective because the infection is caused by a virus, not by bacteria. HOME CARE INSTRUCTIONS  Take medicines only as directed by your health care provider.  Use a cool mist humidifier to make breathing easier.  Get plenty of rest until your temperature returns to normal. This usually takes 3 to 4 days.  Drink enough fluid to keep your urine clear or pale yellow.  Cover yourmouth and nosewhen coughing or sneezing,and wash your handswellto prevent thevirusfrom spreading.  Stay  homefromwork orschool untilthe fever is gonefor at least 1full day. PREVENTION  An annual influenza vaccination (flu shot) is the best way to avoid getting influenza. An annual flu shot is now routinely recommended for all adults in the [place] SEEK MEDICAL CARE IF:  You experiencechest pain, yourcough worsens,or you producemore mucus.  Youhave nausea,vomiting, ordiarrhea.  Your fever returns or gets worse. SEEK IMMEDIATE MEDICAL CARE IF:  You havetrouble breathing, you become short of breath,or your skin ornails becomebluish.  You have severe painor stiffnessin the neck.  You develop a sudden headache, or pain in the face or ear.  You have nausea or vomiting that you cannot control. MAKE SURE YOU:   Understand these instructions.  Will watch your condition.  Will get help right away if you are not doing well or get worse. Document Released: 12/02/2000 Document Revised: 04/21/2014 Document Reviewed: 03/05/2012 ExitCare Patient Information 2015 [employer]. This information is not intended to replace advice given to you by your health care provider. Make sure you discuss any questions you have with your health care provider.  

## 2015-10-31 ENCOUNTER — Encounter (HOSPITAL_COMMUNITY): Payer: Self-pay | Admitting: *Deleted

## 2015-10-31 ENCOUNTER — Emergency Department (HOSPITAL_COMMUNITY)
Admission: EM | Admit: 2015-10-31 | Discharge: 2015-10-31 | Disposition: A | Discharge status: Home / Self Care | Payer: Commercial Managed Care - PPO | Attending: Emergency Medicine | Admitting: Emergency Medicine

## 2015-10-31 DIAGNOSIS — K029 Dental caries, unspecified: Secondary | ICD-10-CM | POA: Insufficient documentation

## 2015-10-31 DIAGNOSIS — K0889 Other specified disorders of teeth and supporting structures: Secondary | ICD-10-CM | POA: Diagnosis not present

## 2015-10-31 DIAGNOSIS — Z79899 Other long term (current) drug therapy: Secondary | ICD-10-CM | POA: Insufficient documentation

## 2015-10-31 DIAGNOSIS — J45909 Unspecified asthma, uncomplicated: Secondary | ICD-10-CM | POA: Insufficient documentation

## 2015-10-31 DIAGNOSIS — Z87891 Personal history of nicotine dependence: Secondary | ICD-10-CM | POA: Insufficient documentation

## 2015-10-31 MED ORDER — BUPIVACAINE HCL (PF) 0.5 % IJ SOLN
1.8000 mL | Freq: Once | INTRAMUSCULAR | Status: AC
  Administered 2015-10-31: 1.8 mL
  Filled 2015-10-31: qty 10

## 2015-10-31 MED ORDER — HYDROCODONE-ACETAMINOPHEN 5-325 MG PO TABS: 1.0000 | ORAL_TABLET | ORAL | Status: DC | PRN

## 2015-10-31 NOTE — ED Notes (Signed)
Pt took Tramadol 50 mg around 11:30 pm this evening. Pt states that the medication helped slightly and only for a short period of time.

## 2015-10-31 NOTE — Discharge Instructions (Signed)

## 2015-10-31 NOTE — ED Notes (Signed)
The pt has had a toothache for months  Worse for the past week

## 2015-10-31 NOTE — ED Provider Notes (Signed)
CSN: [id number]     Arrival date & time 10/31/15  0102 History   First MD Initiated Contact with Patient 10/31/15 0106     Chief Complaint  Patient presents with  . Dental Problem     (Consider location/radiation/quality/duration/timing/severity/associated sxs/prior Treatment) Patient is a 34 y.o. male presenting with tooth pain. The history is provided by the patient. No language interpreter was used.  Dental Pain Location:  Lower Lower teeth location:  19/LL 1st molar Quality:  Aching, throbbing and constant Severity:  Moderate Onset quality:  Gradual Duration:  1 month Timing:  Intermittent Progression:  Waxing and waning Chronicity:  New Context: dental caries   Context: not trauma   Previous work-up:  Dental exam and filled cavity Relieved by:  Nothing Worsened by:  Touching and pressure Ineffective treatments: Ibuprofen and Tramadol; also on Clindamycin. Associated symptoms: facial pain   Associated symptoms: no drooling, no facial swelling, no fever, no oral bleeding, no oral lesions and no trismus   Risk factors: no smoking (former; quit 2014)     Past Medical History  Diagnosis Date  . Asthma   . Allergy    Past Surgical History  Procedure Laterality Date  . Cyst removal trunk     Family History  Problem Relation Age of Onset  . Diabetes Mother   . Hypertension Mother   . High Cholesterol Mother   . Hypertension Father   . High Cholesterol Father   . Cancer Maternal Grandmother   . Heart disease Maternal Grandfather   . Diabetes Maternal Grandfather   . High Cholesterol Maternal Grandfather   . Stroke Maternal Grandfather   . Mental illness Maternal Grandfather    Social History  Substance Use Topics  . Smoking status: Former Smoker    Quit date: 10/11/2013  . Smokeless tobacco: None  . Alcohol Use: 3.0 oz/week    5 Standard drinks or equivalent per week    Review of Systems  Constitutional: Negative for fever.  HENT: Positive for dental  problem. Negative for drooling, facial swelling and mouth sores.   All other systems reviewed and are negative.   Allergies  Review of patient's allergies indicates no known allergies.  Home Medications   Prior to Admission medications   Medication Sig Start Date End Date Taking? Authorizing Provider  chlorpheniramine-HYDROcodone (TUSSIONEX PENNKINETIC ER) 10-8 MG/5ML SUER Take 5 mLs by mouth 2 (two) times daily. 04/19/15   Sherren MochaEva N Shaw, MD  HYDROcodone-acetaminophen (NORCO/VICODIN) 5-325 MG tablet Take 1-2 tablets by mouth every 4 (four) hours as needed for severe pain. 10/31/15   Antony MaduraKelly Nichola Cieslinski, PA-C  ibuprofen (ADVIL,MOTRIN) 800 MG tablet Take 1 tablet (800 mg total) by mouth every 8 (eight) hours as needed. 04/19/15   Sherren MochaEva N Shaw, MD  Multiple Vitamin (MULTIVITAMIN WITH MINERALS) TABS tablet Take 1 tablet by mouth daily.    Historical Provider, MD  oseltamivir (TAMIFLU) 75 MG capsule Take 1 capsule (75 mg total) by mouth 2 (two) times daily. 04/19/15   Sherren MochaEva N Shaw, MD   BP 136/101 mmHg  Pulse 78  Temp(Src) 97.3 F (36.3 C) (Oral)  Resp 22  Ht 5\' 7"  (1.702 m)  Wt 230 lb (104.327 kg)  BMI 36.01 kg/m2  SpO2 96%   Physical Exam  Constitutional: He is oriented to person, place, and time. He appears well-developed and well-nourished. No distress.  Nontoxic/nonseptic appearing  HENT:  Head: Normocephalic and atraumatic.  Mouth/Throat: Uvula is midline, oropharynx is clear and moist and mucous membranes are  normal. No trismus in the jaw. No dental abscesses or uvula swelling.    TTP to L lower 1st molar. No gingival swelling, fluctuance, or trismus.  Eyes: Conjunctivae and EOM are normal. No scleral icterus.  Neck: Normal range of motion.  No nuchal rigidity or meningismus.  Pulmonary/Chest: Effort normal. No respiratory distress.  Musculoskeletal: Normal range of motion.  Neurological: He is alert and oriented to person, place, and time. He exhibits normal muscle tone. Coordination normal.    Skin: Skin is warm and dry. No rash noted. He is not diaphoretic. No erythema. No pallor.  Psychiatric: He has a normal mood and affect. His behavior is normal.  Nursing note and vitals reviewed.   ED Course  Dental Date/Time: 10/31/2015 1:46 AM Performed by: Antony Madura Authorized by: Antony Madura Consent: The procedure was performed in an emergent situation. Verbal consent obtained. Written consent not obtained. Risks and benefits: risks, benefits and alternatives were discussed Consent given by: patient Patient understanding: patient states understanding of the procedure being performed Patient consent: the patient's understanding of the procedure matches consent given Procedure consent: procedure consent matches procedure scheduled Relevant documents: relevant documents present and verified Test results: test results available and properly labeled Site marked: the operative site was marked Imaging studies: imaging studies available Required items: required blood products, implants, devices, and special equipment available Patient identity confirmed: verbally with patient and arm band Time out: Immediately prior to procedure a "time out" was called to verify the correct patient, procedure, equipment, support staff and site/side marked as required. Preparation: Patient was prepped and draped in the usual sterile fashion. Local anesthesia used: yes Anesthesia: local infiltration and digital block Local anesthetic: bupivacaine 0.5% with epinephrine Anesthetic total: 2 ml Patient sedated: no Patient tolerance: Patient tolerated the procedure well with no immediate complications Comments: Local injection to L lower 1st molar with 1cc Marcaine Inferior alveolar nerve block on left for residual pain of 2/10 resulting in resolution of dentalgia.   (including critical care time) Labs Review Labs Reviewed - No data to display  Imaging Review No results found.   I have personally  reviewed and evaluated these images and lab results as part of my medical decision-making.   EKG Interpretation None      MDM   Final diagnoses:  Dentalgia    Patient with toothache. No gross abscess. Exam unconcerning for Ludwig's angina or spread of infection. Patient already on clindamycin for infection coverage. Dental block given in ED for pain control. Urged patient to follow-up with dentist. Return precautions given. Patient agreeable to plan with no unaddressed concerns.   Filed Vitals:   10/31/15 0107  BP: 136/101  Pulse: 78  Temp: 97.3 F (36.3 C)  TempSrc: Oral  Resp: 22  Height:  (1.702 m)  Weight: 230 lb (104.327 kg)  SpO2: 96%     Antony Madura, PA-C 10/31/15 0207  Azalia Bilis, MD 10/31/15 [phone]

## 2016-05-23 ENCOUNTER — Ambulatory Visit (INDEPENDENT_AMBULATORY_CARE_PROVIDER_SITE_OTHER): Payer: BLUE CROSS/BLUE SHIELD | Admitting: Family Medicine

## 2016-05-23 VITALS — BP 122/80 | HR 84 | Temp 97.4°F | Resp 16 | Ht 67.0 in | Wt 229.0 lb

## 2016-05-23 DIAGNOSIS — J0101 Acute recurrent maxillary sinusitis: Secondary | ICD-10-CM

## 2016-05-23 MED ORDER — AMOXICILLIN 500 MG PO TABS: 1000.0000 mg | ORAL_TABLET | Freq: Two times a day (BID) | ORAL | Status: DC

## 2016-05-23 MED ORDER — LORATADINE-PSEUDOEPHEDRINE ER 5-120 MG PO TB12
1.0000 | ORAL_TABLET | Freq: Two times a day (BID) | ORAL | Status: DC
Start: 2016-05-23 — End: 2018-01-29

## 2016-05-23 NOTE — Progress Notes (Signed)
Subjective:    Patient ID: Mark Duarte, male    DOB: Sep 04, 1981, 35 y.o.   MRN: [id number]  05/23/2016  Sinusitis   HPI This 35 y.o. male presents for evaluation of sinus congestion. No fever/chills/sweats.  No headache.  +ear pain B; +ST for three days.  +sinsu pressure and congestion; +nasal congestion daily green thick.  Ears starting to Duarte again.  +clearing throat a lot.  +coughing today.  No SOB.  No n/v/d.  Taking Nyquil, Dayquil.  Also taking nasal decongestant.  No allergy medications currently; no sneezing.  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   PCP: none  Review of Systems  Constitutional: Negative for fever, chills, diaphoresis, activity change, appetite change and fatigue.  HENT: Positive for congestion, ear pain, postnasal drip, rhinorrhea, sinus pressure and voice change. Negative for sneezing, sore throat and trouble swallowing.   Respiratory: Positive for cough. Negative for shortness of breath.   Cardiovascular: Negative for chest pain, palpitations and leg swelling.  Gastrointestinal: Negative for nausea, vomiting, abdominal pain and diarrhea.  Endocrine: Negative for cold intolerance, heat intolerance, polydipsia, polyphagia and polyuria.  Skin: Negative for color change, rash and wound.  Neurological: Negative for dizziness, tremors, seizures, syncope, facial asymmetry, speech difficulty, weakness, light-headedness, numbness and headaches.  Psychiatric/Behavioral: Negative for sleep disturbance and dysphoric mood. The patient is not nervous/anxious.     Past Medical History  Diagnosis Date  . Asthma   . Allergy    Past Surgical History  Procedure Laterality Date  . Cyst removal trunk     No Known Allergies  Social History   Social History  . Marital Status: Single    Spouse Name: N/A  . Number of Children: N/A  . Years of Education: N/A   Occupational History  . Not on file.   Social History Main Topics  . Smoking status: Former Smoker    Quit date:  10/11/2013  . Smokeless tobacco: Not on file  . Alcohol Use: 3.0 oz/week    5 Standard drinks or equivalent per week  . Drug Use: No  . Sexual Activity: Not on file   Other Topics Concern  . Not on file   Social History Narrative   Family History  Problem Relation Age of Onset  . Diabetes Mother   . Hypertension Mother   . High Cholesterol Mother   . Hypertension Father   . High Cholesterol Father   . Cancer Maternal Grandmother   . Heart disease Maternal Grandfather   . Diabetes Maternal Grandfather   . High Cholesterol Maternal Grandfather   . Stroke Maternal Grandfather   . Mental illness Maternal Grandfather        Objective:    BP 122/80 mmHg  Pulse 84  Temp(Src) 97.4 F (36.3 C) (Oral)  Resp 16  Ht  (1.702 m)  Wt 229 lb (103.874 kg)  BMI 35.86 kg/m2  SpO2 97% Physical Exam  Constitutional: He is oriented to person, place, and time. He appears well-developed and well-nourished. No distress.  HENT:  Head: Normocephalic and atraumatic.  Right Ear: Tympanic membrane, external ear and ear canal normal.  Left Ear: Tympanic membrane, external ear and ear canal normal.  Nose: Mucosal edema and rhinorrhea present. Right sinus exhibits maxillary sinus tenderness. Right sinus exhibits no frontal sinus tenderness. Left sinus exhibits maxillary sinus tenderness. Left sinus exhibits no frontal sinus tenderness.  Mouth/Throat: Oropharynx is clear and moist.  Eyes: Conjunctivae and EOM are normal. Pupils are equal, round, and reactive  to light.  Neck: Normal range of motion. Neck supple. Carotid bruit is not present. No thyromegaly present.  Cardiovascular: Normal rate, regular rhythm, normal heart sounds and intact distal pulses.  Exam reveals no gallop and no friction rub.   No murmur heard. Pulmonary/Chest: Effort normal and breath sounds normal. He has no wheezes. He has no rales.  Abdominal: Soft. Bowel sounds are normal. He exhibits no distension and no mass.  There is no tenderness. There is no rebound and no guarding.  Lymphadenopathy:    He has no cervical adenopathy.  Neurological: He is alert and oriented to person, place, and time. No cranial nerve deficit.  Skin: Skin is warm and dry. No rash noted. He is not diaphoretic.  Psychiatric: He has a normal mood and affect. His behavior is normal.  Nursing note and vitals reviewed.       Assessment & Plan:   1. Acute recurrent maxillary sinusitis     No orders of the defined types were placed in this encounter.   Meds ordered this encounter  Medications  . amoxicillin (AMOXIL) 500 MG tablet    Sig: Take 2 tablets (1,000 mg total) by mouth 2 (two) times daily.    Dispense:  40 tablet    Refill:  0  . loratadine-pseudoephedrine (CLARITIN-D 12-HOUR) 5-120 MG tablet    Sig: Take 1 tablet by mouth 2 (two) times daily.    Dispense:  20 tablet    Refill:  0    No Follow-up on file.    Kristi Paulita FujitaMartin Smith, M.D. [facility]  [place] [address] [place], [place]  [id number] [phone] phone [phone] fax

## 2016-05-23 NOTE — Patient Instructions (Addendum)
1.  Afrin nasal spray --- 2 sprays into each nostril twice daily for five days only.     Sinusitis, Adult Sinusitis is redness, soreness, and inflammation of the paranasal sinuses. Paranasal sinuses are air pockets within the bones of your face. They are located beneath your eyes, in the middle of your forehead, and above your eyes. In healthy paranasal sinuses, mucus is able to drain out, and air is able to circulate through them by way of your nose. However, when your paranasal sinuses are inflamed, mucus and air can become trapped. This can allow bacteria and other germs to grow and cause infection. Sinusitis can develop quickly and last only a short time (acute) or continue over a long period (chronic). Sinusitis that lasts for more than 12 weeks is considered chronic. CAUSES Causes of sinusitis include:  Allergies.  Structural abnormalities, such as displacement of the cartilage that separates your nostrils (deviated septum), which can decrease the air flow through your nose and sinuses and affect sinus drainage.  Functional abnormalities, such as when the small hairs (cilia) that line your sinuses and help remove mucus do not work properly or are not present. SIGNS AND SYMPTOMS Symptoms of acute and chronic sinusitis are the same. The primary symptoms are pain and pressure around the affected sinuses. Other symptoms include:  Upper toothache.  Earache.  Headache.  Bad breath.  Decreased sense of smell and taste.  A cough, which worsens when you are lying flat.  Fatigue.  Fever.  Thick drainage from your nose, which often is green and may contain pus (purulent).  Swelling and warmth over the affected sinuses. DIAGNOSIS Your health care provider will perform a physical exam. During your exam, your health care provider may perform any of the following to help determine if you have acute sinusitis or chronic sinusitis:  Look in your nose for signs of abnormal growths in your  nostrils (nasal polyps).  Tap over the affected sinus to check for signs of infection.  View the inside of your sinuses using an imaging device that has a light attached (endoscope). If your health care provider suspects that you have chronic sinusitis, one or more of the following tests may be recommended:  Allergy tests.  Nasal culture. A sample of mucus is taken from your nose, sent to a lab, and screened for bacteria.  Nasal cytology. A sample of mucus is taken from your nose and examined by your health care provider to determine if your sinusitis is related to an allergy. TREATMENT Most cases of acute sinusitis are related to a viral infection and will resolve on their own within 10 days. Sometimes, medicines are prescribed to help relieve symptoms of both acute and chronic sinusitis. These may include pain medicines, decongestants, nasal steroid sprays, or saline sprays. However, for sinusitis related to a bacterial infection, your health care provider will prescribe antibiotic medicines. These are medicines that will help kill the bacteria causing the infection. Rarely, sinusitis is caused by a fungal infection. In these cases, your health care provider will prescribe antifungal medicine. For some cases of chronic sinusitis, surgery is needed. Generally, these are cases in which sinusitis recurs more than 3 times per year, despite other treatments. HOME CARE INSTRUCTIONS  Drink plenty of water. Water helps thin the mucus so your sinuses can drain more easily.  Use a humidifier.  Inhale steam 3-4 times a day (for example, sit in the bathroom with the shower running).  Apply a warm, moist washcloth to  your face 3-4 times a day, or as directed by your health care provider.  Use saline nasal sprays to help moisten and clean your sinuses.  Take medicines only as directed by your health care provider.  If you were prescribed either an antibiotic or antifungal medicine, finish it all  even if you start to feel better. SEEK IMMEDIATE MEDICAL CARE IF:  You have increasing pain or severe headaches.  You have nausea, vomiting, or drowsiness.  You have swelling around your face.  You have vision problems.  You have a stiff neck.  You have difficulty breathing.   This information is not intended to replace advice given to you by your health care provider. Make sure you discuss any questions you have with your health care provider.   Document Released: 12/05/2005 Document Revised: 12/26/2014 Document Reviewed: 12/20/2011 Elsevier Interactive Patient Education 2016 [employer]     IF you received an x-ray today, you will receive an invoice from [facility]. Please contact [facility] at [phone] with questions or concerns regarding your invoice.   IF you received labwork today, you will receive an invoice from [facility]. Please contact [facility] at [phone] with questions or concerns regarding your invoice.   Our billing staff will not be able to assist you with questions regarding bills from these companies.  You will be contacted with the lab results as soon as they are available. The fastest way to get your results is to activate your My Chart account. Instructions are located on the last page of this paperwork. If you have not heard from us regarding the results in 2 weeks, please contact this office.

## 2018-01-29 ENCOUNTER — Encounter (HOSPITAL_COMMUNITY): Payer: Self-pay | Admitting: Emergency Medicine

## 2018-01-29 ENCOUNTER — Other Ambulatory Visit: Payer: Self-pay

## 2018-01-29 ENCOUNTER — Ambulatory Visit (INDEPENDENT_AMBULATORY_CARE_PROVIDER_SITE_OTHER): Payer: BLUE CROSS/BLUE SHIELD

## 2018-01-29 ENCOUNTER — Ambulatory Visit (HOSPITAL_COMMUNITY)
Admission: EM | Admit: 2018-01-29 | Discharge: 2018-01-29 | Disposition: A | Discharge status: Home / Self Care | Payer: BLUE CROSS/BLUE SHIELD | Attending: Internal Medicine | Admitting: Internal Medicine

## 2018-01-29 DIAGNOSIS — R079 Chest pain, unspecified: Secondary | ICD-10-CM | POA: Diagnosis not present

## 2018-01-29 DIAGNOSIS — B349 Viral infection, unspecified: Secondary | ICD-10-CM

## 2018-01-29 DIAGNOSIS — R05 Cough: Secondary | ICD-10-CM | POA: Diagnosis not present

## 2018-01-29 MED ORDER — BENZONATATE 100 MG PO CAPS: 100.0000 mg | ORAL_CAPSULE | Freq: Three times a day (TID) | ORAL | 0 refills | Status: DC

## 2018-01-29 MED ORDER — IPRATROPIUM BROMIDE 0.06 % NA SOLN: 2.0000 | Freq: Four times a day (QID) | NASAL | 0 refills | Status: DC

## 2018-01-29 MED ORDER — FLUTICASONE PROPIONATE 50 MCG/ACT NA SUSP: 2.0000 | Freq: Every day | NASAL | 0 refills | Status: DC

## 2018-01-29 NOTE — ED Provider Notes (Signed)
[facility]    CSN: [id number] Arrival date & time: 01/29/18  1651     History   Chief Complaint Chief Complaint  Patient presents with  . URI    HPI Mark Duarte is a 37 y.o. male.   37 year old male comes in for 2-day history of URI symptoms.  He has had productive cough, rhinorrhea, nasal congestion, postnasal drip.  Denies fever, chills, night sweats.  OTC NyQuil, DayQuil, Mucinex D without relief.  States prior to arrival, had some blood tinged phlegm.  Former smoker, 15 years, 0.5-1 pack/day.      Past Medical History:  Diagnosis Date  . Allergy   . Asthma     There are no active problems to display for this patient.   Past Surgical History:  Procedure Laterality Date  . CYST REMOVAL TRUNK         Home Medications    Prior to Admission medications   Medication Sig Start Date End Date Taking? Authorizing Provider  benzonatate (TESSALON) 100 MG capsule Take 1 capsule (100 mg total) by mouth every 8 (eight) hours. 01/29/18   Cathie Hoops, Maitland Muhlbauer V, PA-C  chlorpheniramine-HYDROcodone (TUSSIONEX PENNKINETIC ER) 10-8 MG/5ML SUER Take 5 mLs by mouth 2 (two) times daily. Patient not taking: Reported on 05/23/2016 04/19/15   Sherren Mocha, MD  fluticasone [facility]) 50 MCG/ACT nasal spray Place 2 sprays into both nostrils daily. 01/29/18   Belinda Fisher, PA-C  HYDROcodone-acetaminophen (NORCO/VICODIN) 5-325 MG tablet Take 1-2 tablets by mouth every 4 (four) hours as needed for severe pain. Patient not taking: Reported on 05/23/2016 10/31/15   Antony Madura, PA-C  ibuprofen (ADVIL,MOTRIN) 800 MG tablet Take 1 tablet (800 mg total) by mouth every 8 (eight) hours as needed. Patient not taking: Reported on 05/23/2016 04/19/15   Sherren Mocha, MD  ipratropium (ATROVENT) 0.06 % nasal spray Place 2 sprays into both nostrils 4 (four) times daily. 01/29/18   Belinda Fisher, PA-C  oseltamivir (TAMIFLU) 75 MG capsule Take 1 capsule (75 mg total) by mouth 2 (two) times daily. Patient not taking:  Reported on 05/23/2016 04/19/15   Sherren Mocha, MD    Family History Family History  Problem Relation Age of Onset  . Diabetes Mother   . Hypertension Mother   . High Cholesterol Mother   . Hypertension Father   . High Cholesterol Father   . Cancer Maternal Grandmother   . Heart disease Maternal Grandfather   . Diabetes Maternal Grandfather   . High Cholesterol Maternal Grandfather   . Stroke Maternal Grandfather   . Mental illness Maternal Grandfather     Social History Social History   Tobacco Use  . Smoking status: Former Smoker    Last attempt to quit: 10/11/2013    Years since quitting: 4.3  Substance Use Topics  . Alcohol use: Yes    Alcohol/week: 3.0 oz    Types: 5 Standard drinks or equivalent per week  . Drug use: No     Allergies   Patient has no known allergies.   Review of Systems Review of Systems  Reason unable to perform ROS: See HPI as above.     Physical Exam Triage Vital Signs ED Triage Vitals  Enc Vitals Group     BP 01/29/18 1815 112/66     Pulse Rate 01/29/18 1815 (!) 104     Resp 01/29/18 1815 (!) 28     Temp 01/29/18 1815 98.7 F (37.1 C)  Temp Source 01/29/18 1815 Oral     SpO2 01/29/18 1815 100 %     Weight --      Height --      Head Circumference --      Peak Flow --      Pain Score 01/29/18 1813 7     Pain Loc --      Pain Edu? --      Excl. in GC? --    No data found.  Updated Vital Signs BP 112/66 (BP Location: Left Arm) Comment (BP Location): large cuff  Pulse (!) 104   Temp 98.7 F (37.1 C) (Oral)   Resp (!) 28   SpO2 100%   Physical Exam  Constitutional: He is oriented to person, place, and time. He appears well-developed and well-nourished. No distress.  HENT:  Head: Normocephalic and atraumatic.  Right Ear: External ear and ear canal normal.  Left Ear: External ear and ear canal normal. Tympanic membrane is erythematous. Tympanic membrane is not bulging.  Nose: Nose normal. Right sinus exhibits no  maxillary sinus tenderness and no frontal sinus tenderness. Left sinus exhibits no maxillary sinus tenderness and no frontal sinus tenderness.  Mouth/Throat: Uvula is midline, oropharynx is clear and moist and mucous membranes are normal.  Right ear with cerumen impaction, TM not visible.  Eyes: Conjunctivae are normal. Pupils are equal, round, and reactive to light.  Neck: Normal range of motion. Neck supple.  Cardiovascular: Normal rate, regular rhythm and normal heart sounds. Exam reveals no gallop and no friction rub.  No murmur heard. Pulmonary/Chest: Effort normal. No accessory muscle usage. No apnea and no tachypnea. No respiratory distress. He has no decreased breath sounds.  Crackles of the right upper field.  Lymphadenopathy:    He has no cervical adenopathy.  Neurological: He is alert and oriented to person, place, and time.  Skin: Skin is warm and dry.  Psychiatric: He has a normal mood and affect. His behavior is normal. Judgment normal.   UC Treatments / Results  Labs (all labs ordered are listed, but only abnormal results are displayed) Labs Reviewed - No data to display  EKG  EKG Interpretation None       Radiology Dg Chest 2 View  Result Date: 01/29/2018 CLINICAL DATA:  Productive cough, chest pain. EXAM: CHEST  2 VIEW COMPARISON:  Radiographs of May 23, 2009. FINDINGS: The heart size and mediastinal contours are within normal limits. Both lungs are clear. No pneumothorax or pleural effusion is noted. The visualized skeletal structures are unremarkable. IMPRESSION: No active cardiopulmonary disease. Electronically Signed   By: Lupita Raider, M.D.   On: 01/29/2018 19:08    Procedures Procedures (including critical care time)  Medications Ordered in UC Medications - No data to display   Initial Impression / Assessment and Plan / UC Course  I have reviewed the triage vital signs and the nursing notes.  Pertinent labs & imaging results that were available  during my care of the patient were reviewed by me and considered in my medical decision making (see chart for details).    X-ray negative for pneumonia.  Discussed with patient symptoms most likely due to viral illness.  Symptomatic treatment discussed.  Push fluids.  Return precautions given.  Patient expresses understanding and agrees to plan.  Final Clinical Impressions(s) / UC Diagnoses   Final diagnoses:  Viral syndrome    ED Discharge Orders        Ordered    benzonatate (TESSALON)  100 MG capsule  Every 8 hours     01/29/18 1916    fluticasone (FLONASE) 50 MCG/ACT nasal spray  Daily     01/29/18 1916    ipratropium (ATROVENT) 0.06 % nasal spray  4 times daily     01/29/18 1916        Lurline IdolYu, Aiesha Leland V, PA-C 01/29/18 1930

## 2018-01-29 NOTE — Discharge Instructions (Signed)
Chest x-ray negative for pneumonia.  Symptoms most likely due to viral illness. Tessalon for cough. Start flonase, atrovent nasal spray for nasal congestion/drainage.  You can continue DayQuil/NyQuil for the symptoms.  You can use over the counter nasal saline rinse such as neti pot for nasal congestion. Keep hydrated, your urine should be clear to pale yellow in color. Tylenol/motrin for fever and pain. Monitor for any worsening of symptoms, chest pain, shortness of breath, wheezing, swelling of the throat, follow up for reevaluation.   For sore throat try using a honey-based tea. Use 3 teaspoons of honey with juice squeezed from half lemon. Place shaved pieces of ginger into 1/2-1 cup of water and warm over stove top. Then mix the ingredients and repeat every 4 hours as needed.

## 2018-01-29 NOTE — ED Triage Notes (Signed)
Patient started feeling bad over the weekend.  Patient has had runny nose, drainage in throat, coughing frequently.  Reports green phlegm.  Reports blood in phlegm prior to coming in ucc.  General aches and pain

## 2018-01-31 ENCOUNTER — Other Ambulatory Visit: Payer: Self-pay

## 2018-01-31 ENCOUNTER — Ambulatory Visit: Payer: BLUE CROSS/BLUE SHIELD | Admitting: Physician Assistant

## 2018-01-31 ENCOUNTER — Encounter: Payer: Self-pay | Admitting: Physician Assistant

## 2018-01-31 VITALS — BP 118/78 | HR 88 | Temp 98.5°F | Resp 20 | Ht 67.0 in | Wt 233.8 lb

## 2018-01-31 DIAGNOSIS — R062 Wheezing: Secondary | ICD-10-CM | POA: Diagnosis not present

## 2018-01-31 DIAGNOSIS — J4521 Mild intermittent asthma with (acute) exacerbation: Secondary | ICD-10-CM | POA: Diagnosis not present

## 2018-01-31 DIAGNOSIS — R6889 Other general symptoms and signs: Secondary | ICD-10-CM

## 2018-01-31 DIAGNOSIS — R0600 Dyspnea, unspecified: Secondary | ICD-10-CM | POA: Diagnosis not present

## 2018-01-31 LAB — POCT CBC
GRANULOCYTE PERCENT: 66.9 % (ref 37–80)
HCT, POC: 43.4 % — AB (ref 43.5–53.7)
Hemoglobin: 14.3 g/dL (ref 14.1–18.1)
LYMPH, POC: 2 (ref 0.6–3.4)
MCH, POC: 30.4 pg (ref 27–31.2)
MCHC: 32.9 g/dL (ref 31.8–35.4)
MCV: 92.5 fL (ref 80–97)
MID (cbc): 0.5 (ref 0–0.9)
MPV: 6.8 fL (ref 0–99.8)
PLATELET COUNT, POC: 258 10*3/uL (ref 142–424)
POC Granulocyte: 5 (ref 2–6.9)
POC LYMPH PERCENT: 26.7 %L (ref 10–50)
POC MID %: 6.4 %M (ref 0–12)
RBC: 4.69 M/uL (ref 4.69–6.13)
RDW, POC: 12.6 %
WBC: 7.5 10*3/uL (ref 4.6–10.2)

## 2018-01-31 LAB — POC INFLUENZA A&B (BINAX/QUICKVUE)
Influenza A, POC: NEGATIVE
Influenza B, POC: NEGATIVE

## 2018-01-31 MED ORDER — IPRATROPIUM BROMIDE 0.02 % IN SOLN
0.5000 mg | Freq: Once | RESPIRATORY_TRACT | Status: AC
  Administered 2018-01-31: 0.5 mg via RESPIRATORY_TRACT

## 2018-01-31 MED ORDER — PREDNISONE 20 MG PO TABS: ORAL_TABLET | ORAL | 0 refills | Status: DC

## 2018-01-31 MED ORDER — HYDROCOD POLST-CPM POLST ER 10-8 MG/5ML PO SUER: 5.0000 mL | Freq: Two times a day (BID) | ORAL | 0 refills | Status: DC | PRN

## 2018-01-31 MED ORDER — ALBUTEROL SULFATE (2.5 MG/3ML) 0.083% IN NEBU
2.5000 mg | INHALATION_SOLUTION | Freq: Once | RESPIRATORY_TRACT | Status: AC
  Administered 2018-01-31: 2.5 mg via RESPIRATORY_TRACT

## 2018-01-31 MED ORDER — ALBUTEROL SULFATE HFA 108 (90 BASE) MCG/ACT IN AERS: 2.0000 | INHALATION_SPRAY | RESPIRATORY_TRACT | 1 refills | Status: DC | PRN

## 2018-01-31 NOTE — Patient Instructions (Addendum)
Please use the albuterol every 6 hours for the next 24 hours.  You will then start the prednisone tomorrow morning. You can continue the mucinex maximum strength.  I am ordering this.  Make sure you are hydrating well with water.    Bronchospasm, Adult Bronchospasm is a tightening of the airways going into the lungs. During an episode, it may be harder to breathe. You may cough, and you may make a whistling sound when you breathe (wheeze). This condition often affects people with asthma. What are the causes? This condition is caused by swelling and irritation in the airways. It can be triggered by:  An infection (common).  Seasonal allergies.  An allergic reaction.  Exercise.  Irritants. These include pollution, cigarette smoke, strong odors, aerosol sprays, and paint fumes.  Weather changes. Winds increase molds and pollens in the air. Cold air may cause swelling.  Stress and emotional upset.  What are the signs or symptoms? Symptoms of this condition include:  Wheezing. If the episode was triggered by an allergy, wheezing may start right away or hours later.  Nighttime coughing.  Frequent or severe coughing with a simple cold.  Chest tightness.  Shortness of breath.  Decreased ability to exercise.  How is this diagnosed? This condition is usually diagnosed with a review of your medical history and a physical exam. Tests, such as lung function tests, are sometimes done to look for other conditions. The need for a chest X-ray depends on where the wheezing occurs and whether it is the first time you have wheezed. How is this treated? This condition may be treated with:  Inhaled medicines. These open up the airways and help you breathe. They can be taken with an inhaler or a nebulizer device.  Corticosteroid medicines. These may be given for severe bronchospasm, usually when it is associated with asthma.  Avoiding triggers, such as irritants, infection, or  allergies.  Follow these instructions at home: Medicines  Take over-the-counter and prescription medicines only as told by your health care provider.  If you need to use an inhaler or nebulizer to take your medicine, ask your health care provider to explain how to use it correctly. If you were given a spacer, always use it with your inhaler. Lifestyle  Reduce the number of triggers in your home. To do this: ? Change your heating and air conditioning filter at least once a month. ? Limit your use of fireplaces and wood stoves. ? Do not smoke. Do not allow smoking in your home. ? Avoid using perfumes and fragrances. ? Get rid of pests, such as roaches and mice, and their droppings. ? Remove any mold from your home. ? Keep your house clean and dust free. Use unscented cleaning products. ? Replace carpet with wood, tile, or vinyl flooring. Carpet can trap dander and dust. ? Use allergy-proof pillows, mattress covers, and box spring covers. ? Wash bed sheets and blankets every week in hot water. Dry them in a dryer. ? Use blankets that are made of polyester or cotton. ? Wash your hands often. ? Do not allow pets in your bedroom.  Avoid breathing in cold air when you exercise. General instructions  Have a plan for seeking medical care. Know when to call your health care provider and local emergency services, and where to get emergency care.  Stay up to date on your immunizations.  When you have an episode of bronchospasm, stay calm. Try to relax and breathe more slowly.  If you have  asthma, make sure you have an asthma action plan.  Keep all follow-up visits as told by your health care provider. This is important. Contact a health care provider if:  You have muscle aches.  You have chest pain.  The mucus that you cough up (sputum) changes from clear or white to yellow, green, gray, or bloody.  You have a fever.  Your sputum gets thicker. Get help right away if:  Your  wheezing and coughing get worse, even after you take your prescribed medicines.  It gets even harder to breathe.  You develop severe chest pain. Summary  Bronchospasm is a tightening of the airways going into the lungs.  During an episode of bronchospasm, you may have a harder time breathing. You may cough and make a whistling sound when you breathe (wheeze).  Avoid exposure to triggers such as smoke, dust, mold, animal dander, and fragrances.  When you have an episode of bronchospasm, stay calm. Try to relax and breathe more slowly. This information is not intended to replace advice given to you by your health care provider. Make sure you discuss any questions you have with your health care provider. Document Released: 12/08/2003 Document Revised: 12/01/2016 Document Reviewed: 12/01/2016 Elsevier Interactive Patient Education  2017 [employer]     IF you received an x-ray today, you will receive an invoice from [facility]. Please contact [facility] at [phone] with questions or concerns regarding your invoice.   IF you received labwork today, you will receive an invoice from [facility]. Please contact [facility] at [phone] with questions or concerns regarding your invoice.   Our billing staff will not be able to assist you with questions regarding bills from these companies.  You will be contacted with the lab results as soon as they are available. The fastest way to get your results is to activate your My Chart account. Instructions are located on the last page of this paperwork. If you have not heard from us regarding the results in 2 weeks, please contact this office.

## 2018-01-31 NOTE — Progress Notes (Signed)
PRIMARY CARE AT [facility] [address] [place] [address] [phone]  Date:  01/31/2018   Name:  Mark Duarte   DOB:  1981-01-12   MRN:  [id number]  PCP:  Patient, No Pcp Per    History of Present Illness:  Mark Duarte is a 37 y.o. male patient who presents to PCP with  Chief Complaint  Patient presents with  . Congestion    Pt states he went to an urgent care on Monday and was given Atrovent and Tessalon. Pt states the urgent care did an chest x-ray and it came back normal. Pt states cough and congestions has gotten worse. Pt states his face hurts, and fatigue, and body aches, and chills     5 days ago, he was having coughing, sneezing, bodyaches, [name], [name], [name]     There are no active problems to display for this patient.   Past Medical History:  Diagnosis Date  . Allergy   . Asthma     Past Surgical History:  Procedure Laterality Date  . CYST REMOVAL TRUNK      Social History   Tobacco Use  . Smoking status: Former Smoker    Last attempt to quit: 10/11/2013    Years since quitting: 4.3  . Smokeless tobacco: Never Used  Substance Use Topics  . Alcohol use: Yes    Alcohol/week: 3.0 oz    Types: 5 Standard drinks or equivalent per week  . Drug use: No    Family History  Problem Relation Age of Onset  . Diabetes Mother   . Hypertension Mother   . High Cholesterol Mother   . Hypertension Father   . High Cholesterol Father   . Cancer Maternal Grandmother   . Heart disease Maternal Grandfather   . Diabetes Maternal Grandfather   . High Cholesterol Maternal Grandfather   . Stroke Maternal  Grandfather   . Mental illness Maternal Grandfather     No Known Allergies  Medication list has been reviewed and updated.  Current Outpatient Medications on File Prior to Visit  Medication Sig Dispense Refill  . benzonatate (TESSALON) 100 MG capsule Take 1 capsule (100 mg total) by mouth every 8 (eight) hours. 21 capsule 0  . ipratropium (ATROVENT) 0.06 % nasal spray Place 2 sprays into both nostrils 4 (four) times daily. 15 mL 0  . chlorpheniramine-HYDROcodone (TUSSIONEX PENNKINETIC ER) 10-8 MG/5ML SUER Take 5 mLs by mouth 2 (two) times daily. (Patient not taking: Reported on 05/23/2016) 120 mL 0  . fluticasone (FLONASE) 50 MCG/ACT nasal spray Place 2 sprays into both nostrils daily. 1 g 0  . HYDROcodone-acetaminophen (NORCO/VICODIN) 5-325 MG tablet Take 1-2 tablets by mouth every 4 (four) hours as needed for severe pain. (Patient not taking: Reported on 05/23/2016) 5 tablet 0  . ibuprofen (ADVIL,MOTRIN) 800 MG tablet Take 1 tablet (800 mg total) by mouth every 8 (eight) hours as needed. (Patient not taking: Reported on 05/23/2016) 30 tablet 0  . oseltamivir (TAMIFLU) 75 MG capsule Take 1 capsule (75 mg total) by mouth 2 (two) times  daily. (Patient not taking: Reported on 05/23/2016) 10 capsule 0   No current facility-administered medications on file prior to visit.     ROS ROS otherwise unremarkable unless listed above.  Physical Examination: BP 118/78 (BP Location: Right Arm, Patient Position: Sitting, Cuff Size: Normal)   Pulse 88   Temp 98.5 F (36.9 C) (Oral)   Resp 20   Ht 5\' 7"  (1.702 m)   Wt 233 lb 12.8 oz (106.1 kg)   SpO2 96%   BMI 36.62 kg/m  Ideal Body Weight: Weight in (lb) to have BMI = 25: 159.3  Physical Exam  Constitutional: He is oriented to person, place, and time. He appears well-developed and well-nourished. No distress.  HENT:  Head: Normocephalic and atraumatic.  Right Ear: Tympanic membrane, external ear and ear canal normal.  Left Ear: Tympanic membrane,  external ear and ear canal normal.  Nose: Mucosal edema and rhinorrhea present. Right sinus exhibits no maxillary sinus tenderness and no frontal sinus tenderness. Left sinus exhibits no maxillary sinus tenderness and no frontal sinus tenderness.  Mouth/Throat: No uvula swelling. No oropharyngeal exudate, posterior oropharyngeal edema or posterior oropharyngeal erythema.  Eyes: Conjunctivae, EOM and lids are normal. Pupils are equal, round, and reactive to light. Right eye exhibits normal extraocular motion. Left eye exhibits normal extraocular motion.  Neck: Trachea normal and full passive range of motion without pain. No edema and no erythema present.  Cardiovascular: Normal rate. Exam reveals no friction rub.  No murmur heard. Pulmonary/Chest: Effort normal. No respiratory distress. He has no decreased breath sounds. He has wheezes. He has no rhonchi.  Neurological: He is alert and oriented to person, place, and time.  Skin: Skin is warm and dry. He is not diaphoretic.  Psychiatric: He has a normal mood and affect. His behavior is normal.   Improved lung sounds following nebulizing treatment with increase peak flow and pt notes improvement.   Results for orders placed or performed in visit on 01/31/18  POC Influenza A&B(BINAX/QUICKVUE)  Result Value Ref Range   Influenza A, POC Negative Negative   Influenza B, POC Negative Negative  POCT CBC  Result Value Ref Range   WBC 7.5 4.6 - 10.2 K/uL   Lymph, poc 2.0 0.6 - 3.4   POC LYMPH PERCENT 26.7 10 - 50 %L   MID (cbc) 0.5 0 - 0.9   POC MID % 6.4 0 - 12 %M   POC Granulocyte 5.0 2 - 6.9   Granulocyte percent 66.9 37 - 80 %G   RBC 4.69 4.69 - 6.13 M/uL   Hemoglobin 14.3 14.1 - 18.1 g/dL   HCT, POC 16.143.4 (A) 09.643.5 - 53.7 %   MCV 92.5 80 - 97 fL   MCH, POC 30.4 27 - 31.2 pg   MCHC 32.9 31.8 - 35.4 g/dL   RDW, POC 04.512.6 %   Platelet Count, POC 258 142 - 424 K/uL   MPV 6.8 0 - 99.8 fL    Assessment and Plan: Mark Duarte is a 37 y.o.  male who is here today for cc of  Chief Complaint  Patient presents with  . Congestion    Pt states he went to an urgent care on Monday and was given Atrovent and Tessalon. Pt states the urgent care did an chest x-ray and it came back normal. Pt states cough and congestions has gotten worse. Pt states his face hurts, and fatigue, and body aches, and chills  prednisone taper, and cough supportive treatment Alarming symptoms  to warrant immediate return advised. Flu-like symptoms - Plan: POC Influenza A&B(BINAX/QUICKVUE), POCT CBC, predniSONE (DELTASONE) 20 MG tablet, chlorpheniramine-HYDROcodone (TUSSIONEX PENNKINETIC ER) 10-8 MG/5ML SUER, DISCONTINUED: chlorpheniramine-HYDROcodone (TUSSIONEX PENNKINETIC ER) 10-8 MG/5ML SUER  Dyspnea, unspecified type - Plan: albuterol (PROVENTIL) (2.5 MG/3ML) 0.083% nebulizer solution 2.5 mg, ipratropium (ATROVENT) nebulizer solution 0.5 mg, predniSONE (DELTASONE) 20 MG tablet, chlorpheniramine-HYDROcodone (TUSSIONEX PENNKINETIC ER) 10-8 MG/5ML SUER, DISCONTINUED: chlorpheniramine-HYDROcodone (TUSSIONEX PENNKINETIC ER) 10-8 MG/5ML SUER  Wheezing - Plan: albuterol (PROVENTIL) (2.5 MG/3ML) 0.083% nebulizer solution 2.5 mg, ipratropium (ATROVENT) nebulizer solution 0.5 mg, predniSONE (DELTASONE) 20 MG tablet, chlorpheniramine-HYDROcodone (TUSSIONEX PENNKINETIC ER) 10-8 MG/5ML SUER, DISCONTINUED: chlorpheniramine-HYDROcodone (TUSSIONEX PENNKINETIC ER) 10-8 MG/5ML SUER  Trena Platt, PA-C [facility] 2/15/201910:35 AM

## 2018-03-19 ENCOUNTER — Encounter: Payer: Self-pay | Admitting: Physician Assistant

## 2018-05-10 ENCOUNTER — Encounter: Payer: Self-pay | Admitting: Family Medicine

## 2018-06-20 DIAGNOSIS — Z23 Encounter for immunization: Secondary | ICD-10-CM | POA: Diagnosis not present

## 2018-06-20 DIAGNOSIS — Z13220 Encounter for screening for lipoid disorders: Secondary | ICD-10-CM | POA: Diagnosis not present

## 2018-06-20 DIAGNOSIS — Z0000 Encounter for general adult medical examination without abnormal findings: Secondary | ICD-10-CM | POA: Diagnosis not present

## 2018-09-14 ENCOUNTER — Encounter (HOSPITAL_COMMUNITY): Payer: Self-pay | Admitting: Emergency Medicine

## 2018-09-14 ENCOUNTER — Emergency Department (HOSPITAL_COMMUNITY)
Admission: EM | Admit: 2018-09-14 | Discharge: 2018-09-14 | Disposition: A | Discharge status: Home / Self Care | Payer: BLUE CROSS/BLUE SHIELD | Attending: Emergency Medicine | Admitting: Emergency Medicine

## 2018-09-14 ENCOUNTER — Emergency Department (HOSPITAL_COMMUNITY): Payer: BLUE CROSS/BLUE SHIELD

## 2018-09-14 ENCOUNTER — Other Ambulatory Visit: Payer: Self-pay

## 2018-09-14 DIAGNOSIS — Z87891 Personal history of nicotine dependence: Secondary | ICD-10-CM | POA: Insufficient documentation

## 2018-09-14 DIAGNOSIS — J45909 Unspecified asthma, uncomplicated: Secondary | ICD-10-CM | POA: Insufficient documentation

## 2018-09-14 DIAGNOSIS — Z79899 Other long term (current) drug therapy: Secondary | ICD-10-CM | POA: Insufficient documentation

## 2018-09-14 DIAGNOSIS — S0003XA Contusion of scalp, initial encounter: Secondary | ICD-10-CM | POA: Diagnosis not present

## 2018-09-14 DIAGNOSIS — I499 Cardiac arrhythmia, unspecified: Secondary | ICD-10-CM | POA: Diagnosis not present

## 2018-09-14 DIAGNOSIS — R200 Anesthesia of skin: Secondary | ICD-10-CM | POA: Insufficient documentation

## 2018-09-14 LAB — I-STAT CHEM 8, ED
BUN: 25 mg/dL — ABNORMAL HIGH (ref 6–20)
CALCIUM ION: 1.06 mmol/L — AB (ref 1.15–1.40)
CHLORIDE: 104 mmol/L (ref 98–111)
Creatinine, Ser: 1.3 mg/dL — ABNORMAL HIGH (ref 0.61–1.24)
Glucose, Bld: 105 mg/dL — ABNORMAL HIGH (ref 70–99)
HCT: 48 % (ref 39.0–52.0)
HEMOGLOBIN: 16.3 g/dL (ref 13.0–17.0)
POTASSIUM: 3.7 mmol/L (ref 3.5–5.1)
SODIUM: 139 mmol/L (ref 135–145)
TCO2: 23 mmol/L (ref 22–32)

## 2018-09-14 LAB — COMPREHENSIVE METABOLIC PANEL
ALT: 40 U/L (ref 0–44)
ANION GAP: 15 (ref 5–15)
AST: 25 U/L (ref 15–41)
Albumin: 4.5 g/dL (ref 3.5–5.0)
Alkaline Phosphatase: 54 U/L (ref 38–126)
BUN: 22 mg/dL — ABNORMAL HIGH (ref 6–20)
CHLORIDE: 104 mmol/L (ref 98–111)
CO2: 19 mmol/L — ABNORMAL LOW (ref 22–32)
Calcium: 9.5 mg/dL (ref 8.9–10.3)
Creatinine, Ser: 1.05 mg/dL (ref 0.61–1.24)
Glucose, Bld: 113 mg/dL — ABNORMAL HIGH (ref 70–99)
Potassium: 3.8 mmol/L (ref 3.5–5.1)
SODIUM: 138 mmol/L (ref 135–145)
Total Bilirubin: 1.1 mg/dL (ref 0.3–1.2)
Total Protein: 7.7 g/dL (ref 6.5–8.1)

## 2018-09-14 LAB — CBC
HCT: 46.7 % (ref 39.0–52.0)
Hemoglobin: 16 g/dL (ref 13.0–17.0)
MCH: 31 pg (ref 26.0–34.0)
MCHC: 34.3 g/dL (ref 30.0–36.0)
MCV: 90.5 fL (ref 78.0–100.0)
PLATELETS: 258 10*3/uL (ref 150–400)
RBC: 5.16 MIL/uL (ref 4.22–5.81)
RDW: 11.6 % (ref 11.5–15.5)
WBC: 8.5 10*3/uL (ref 4.0–10.5)

## 2018-09-14 LAB — PROTIME-INR
INR: 1
PROTHROMBIN TIME: 13.1 s (ref 11.4–15.2)

## 2018-09-14 LAB — DIFFERENTIAL
Abs Immature Granulocytes: 0 10*3/uL (ref 0.0–0.1)
BASOS PCT: 1 %
Basophils Absolute: 0 10*3/uL (ref 0.0–0.1)
EOS PCT: 2 %
Eosinophils Absolute: 0.1 10*3/uL (ref 0.0–0.7)
Immature Granulocytes: 0 %
Lymphocytes Relative: 34 %
Lymphs Abs: 2.9 10*3/uL (ref 0.7–4.0)
MONO ABS: 0.4 10*3/uL (ref 0.1–1.0)
MONOS PCT: 5 %
NEUTROS PCT: 58 %
Neutro Abs: 5 10*3/uL (ref 1.7–7.7)

## 2018-09-14 LAB — I-STAT TROPONIN, ED: TROPONIN I, POC: 0 ng/mL (ref 0.00–0.08)

## 2018-09-14 LAB — APTT: aPTT: 32 seconds (ref 24–36)

## 2018-09-14 NOTE — Discharge Instructions (Signed)
It was my pleasure taking care of you today!   Please call one of the neurology clinics to schedule a follow up appointment.   Return to ER for new or worsening symptoms, any additional concerns.

## 2018-09-14 NOTE — ED Provider Notes (Signed)
[facility] EMERGENCY DEPARTMENT Provider Note   CSN: [id number] Arrival date & time: 09/14/18  1521     History   Chief Complaint Chief Complaint  Patient presents with  . Numbness    HPI GJON LETARTE is a 37 y.o. male.  The history is provided by the patient and medical records. No language interpreter was used.    FRANCOIS ELK is a 37 y.o. male  with a PMH of asthma who presents to the Emergency Department complaining of left leg pain and numbness which began earlier this morning. Patient states that he was up on his feet at work when he suddenly developed pain to the left thigh. He then noticed numbness to the entire foot and leg. Could not differentiate one part of the foot/leg, just diffuse numb sensation. He was able to walk on the affected limb without gait change, although this was causing him pain to the left thigh. Several minutes later, he developed left arm numbness and "soreness" to the left lateral aspect of the upper arm. Symptoms have been constant since onset with no change in severity.  No medications taken prior to arrival for symptoms.  No history of similar sxs in the past. He has had some left-sided low back pain for a day or two. No change to back pain. Hx of sciatica in the past, but this feels much different. No headache, visual changes, weakness, lightheadedness, dizziness, syncopal episode.  No recent trauma, fever or chills.  No recent surgeries, travel, immobilizations.   Past Medical History:  Diagnosis Date  . Allergy   . Asthma     There are no active problems to display for this patient.   Past Surgical History:  Procedure Laterality Date  . CYST REMOVAL TRUNK          Home Medications    Prior to Admission medications   Medication Sig Start Date End Date Taking? Authorizing Provider  albuterol (PROVENTIL HFA;VENTOLIN HFA) 108 (90 Base) MCG/ACT inhaler Inhale 2 puffs into the lungs every 4 (four) hours as needed  for wheezing or shortness of breath (cough, shortness of breath or wheezing.). 01/31/18   Trena Platt D, PA  benzonatate (TESSALON) 100 MG capsule Take 1 capsule (100 mg total) by mouth every 8 (eight) hours. 01/29/18   Cathie Hoops, Amy V, PA-C  chlorpheniramine-HYDROcodone (TUSSIONEX PENNKINETIC ER) 10-8 MG/5ML SUER Take 5 mLs by mouth every 12 (twelve) hours as needed for cough. 01/31/18   Trena Platt D, PA  fluticasone (FLONASE) 50 MCG/ACT nasal spray Place 2 sprays into both nostrils daily. 01/29/18   Cathie Hoops, Amy V, PA-C  ipratropium (ATROVENT) 0.06 % nasal spray Place 2 sprays into both nostrils 4 (four) times daily. 01/29/18   Belinda Fisher, PA-C  predniSONE (DELTASONE) 20 MG tablet Take 3 PO QAM x2days, 2 PO QAM x2days, 1 PO QAM x3days 01/31/18   Trena Platt D, PA    Family History Family History  Problem Relation Age of Onset  . Diabetes Mother   . Hypertension Mother   . High Cholesterol Mother   . Hypertension Father   . High Cholesterol Father   . Cancer Maternal Grandmother   . Heart disease Maternal Grandfather   . Diabetes Maternal Grandfather   . High Cholesterol Maternal Grandfather   . Stroke Maternal Grandfather   . Mental illness Maternal Grandfather     Social History Social History   Tobacco Use  . Smoking status: Former Smoker  Last attempt to quit: 10/11/2013    Years since quitting: 4.9  . Smokeless tobacco: Never Used  Substance Use Topics  . Alcohol use: Yes    Alcohol/week: 5.0 standard drinks    Types: 5 Standard drinks or equivalent per week    Comment: 2 beers/day 5 days a week  . Drug use: No     Allergies   Patient has no known allergies.   Review of Systems Review of Systems  Musculoskeletal: Positive for back pain and myalgias.  Neurological: Positive for numbness. Negative for dizziness, syncope, speech difficulty, weakness, light-headedness and headaches.  All other systems reviewed and are negative.    Physical Exam Updated  Vital Signs BP (!) 120/55 (BP Location: Left Arm)   Pulse 77   Temp 97.9 F (36.6 C) (Oral)   Resp 13   Ht 5\' 7"  (1.702 m)   Wt 99.8 kg   SpO2 97%   BMI 34.46 kg/m   Physical Exam  Constitutional: He is oriented to person, place, and time. He appears well-developed and well-nourished. No distress.  HENT:  Head: Normocephalic and atraumatic.  Cardiovascular: Normal rate, regular rhythm and normal heart sounds.  No murmur heard. Pulmonary/Chest: Effort normal and breath sounds normal. No respiratory distress.  Abdominal: Soft. He exhibits no distension. There is no tenderness.  Musculoskeletal:  5/5 muscle strength in upper and lower extremities bilaterally including strong and equal grip strength and dorsiflexion/plantar flexion. Full ROM.  No midline C/T/L spine tenderness. He does have tenderness to left lumbar paraspinal musculature.   Neurological: He is alert and oriented to person, place, and time.  Alert, oriented, thought content appropriate, able to give a coherent history. Speech is clear and goal oriented, able to follow commands.  Cranial Nerves:  II:  Peripheral visual fields grossly normal, pupils equal, round, reactive to light III, IV, VI: EOM intact bilaterally, ptosis not present V,VII: smile symmetric, eyes kept closed tightly against resistance, facial light touch sensation equal VIII: hearing grossly normal IX, X: symmetric soft palate movement, uvula elevates symmetrically  XI: bilateral shoulder shrug symmetric and strong XII: midline tongue extension Subjective decreased sensation to left upper extremity when compared to right UE. Equal sensation to LE's.  Normal finger-to-nose and rapid alternating movements; normal gait and balance.Negative romberg, no pronator drift.  Skin: Skin is warm and dry.  Nursing note and vitals reviewed.    ED Treatments / Results  Labs (all labs ordered are listed, but only abnormal results are displayed) Labs Reviewed    COMPREHENSIVE METABOLIC PANEL - Abnormal; Notable for the following components:      Result Value   CO2 19 (*)    Glucose, Bld 113 (*)    BUN 22 (*)    All other components within normal limits  I-STAT CHEM 8, ED - Abnormal; Notable for the following components:   BUN 25 (*)    Creatinine, Ser 1.30 (*)    Glucose, Bld 105 (*)    Calcium, Ion 1.06 (*)    All other components within normal limits  PROTIME-INR  APTT  CBC  DIFFERENTIAL  I-STAT TROPONIN, ED  CBG MONITORING, ED    EKG EKG Interpretation  Date/Time:  Friday September 14 2018 15:31:23 EDT Ventricular Rate:  92 PR Interval:  142 QRS Duration: 80 QT Interval:  344 QTC Calculation: 425 R Axis:   82 Text Interpretation:  Normal sinus rhythm with sinus arrhythmia Possible Left atrial enlargement Borderline ECG Confirmed by Benjiman Core [phone]) on  09/14/2018 8:10:09 PM   Radiology Ct Head Wo Contrast  Result Date: 09/14/2018 CLINICAL DATA:  Left lower extremity pain and numbness EXAM: CT HEAD WITHOUT CONTRAST TECHNIQUE: Contiguous axial images were obtained from the base of the skull through the vertex without intravenous contrast. COMPARISON:  None. FINDINGS: Brain: The ventricles are normal in size and configuration. There is no intracranial mass, hemorrhage, extra-axial fluid collection, or midline shift. Brain parenchyma appears normal. No acute infarct evident. Vascular: No hyperdense vessel.  No evident vascular calcification. Skull: Bony calvarium appears intact. There is evidence of a small right parietal scalp hematoma. Sinuses/Orbits: There is a retention cyst in the anterior left sphenoid sinus. There is mild mucosal thickening in several ethmoid air cells. There is mild mucosal thickening in the posteromedial right maxillary antrum. Other visualized paranasal sinuses are clear. Orbits appear symmetric bilaterally. Other: Mastoid air cells are clear. There is debris in the right external auditory canal.  IMPRESSION: Areas of paranasal sinus disease. Probable cerumen in the right external auditory canal. Small right parietal scalp hematoma. Study otherwise unremarkable. Electronically Signed   By: Bretta Bang III M.D.   On: 09/14/2018 16:09    Procedures Procedures (including critical care time)  Medications Ordered in ED Medications - No data to display   Initial Impression / Assessment and Plan / ED Course  I have reviewed the triage vital signs and the nursing notes.  Pertinent labs & imaging results that were available during my care of the patient were reviewed by me and considered in my medical decision making (see chart for details).    KALEV TEMME is a 37 y.o. male who presents to ED for left thigh pain, left leg numbness and left arm numbness which began today. On exam, patient with full strength in all four extremities. No weakness or midline tenderness. No focal neurologic deficits. CT head reviewed and reassuring. Labs reassuring as well. EKG non-ischemic. Shared decision making conversation with patient and family at bedside. Discussed negative workup thus far, however recommended MRI of head and neck for further evaluation. Other alternative would be follow up with neurology and strict return precautions. Patient would like to go home with neuro follow up and have further workup done as an outpatient. We discussed reasons to return to ER at length. All questions answered.   Patient discussed with Dr. Rubin Payor who agrees with treatment plan.    Final Clinical Impressions(s) / ED Diagnoses   Final diagnoses:  Numbness    ED Discharge Orders    None       Ward, Chase Picket, PA-C 09/14/18 2119    Benjiman Core, MD 09/15/18 [phone]

## 2018-09-14 NOTE — ED Notes (Signed)
Family at bedside. 

## 2018-09-14 NOTE — ED Triage Notes (Addendum)
Pt reports his left leg started becoming numb and sore around noon today. Pt reports sensory deficit to left arm and left leg. No other neuro deficits, denies weakness. Pt is ambulatory but reports it is uncomfortable. Denies any injuries. Hx of sciatica but this does not feel the same.

## 2018-09-14 NOTE — ED Notes (Signed)
Patient is alert and orientedx4.  Patient was explained discharge instructions and they understood them with no questions.  The patient's wife, Maycen Degregory is taking the patient home.

## 2018-09-14 NOTE — ED Provider Notes (Signed)
Patient placed in Quick Look pathway, seen and evaluated   Chief Complaint: left leg numbness  HPI:  Mark Duarte is a 37 y.o. male who presents to the ED with left leg numbness and soreness that started at work today around lunch time. Patient denies weakness. Patient reports pain with ambulation. No known injuries. Hx of sciatica.  ROS: M/S: back and leg pain  Physical Exam:  BP 117/76 (BP Location: Right Arm)   Pulse (!) 101   Temp (!) 97.5 F (36.4 C) (Oral)   Resp 18   Ht 5\' 7"  (1.702 m)   Wt 99.8 kg   SpO2 98%   BMI 34.46 kg/m    Gen: No distress  Neuro: Awake and Alert  Skin: Warm and dry  Heart: tachycardia  M/S: low back tender left side left leg without weakness.     Initiation of care has begun. The patient has been counseled on the process, plan, and necessity for staying for the completion/evaluation, and the remainder of the medical screening examination    Janne Napoleon, NP 09/14/18 1610    Derwood Kaplan, MD 09/14/18 [phone]

## 2018-11-08 DIAGNOSIS — J069 Acute upper respiratory infection, unspecified: Secondary | ICD-10-CM | POA: Diagnosis not present

## 2018-12-17 ENCOUNTER — Ambulatory Visit: Payer: BLUE CROSS/BLUE SHIELD | Admitting: Neurology

## 2019-01-12 ENCOUNTER — Other Ambulatory Visit: Payer: Self-pay

## 2019-01-12 ENCOUNTER — Encounter (HOSPITAL_COMMUNITY): Payer: Self-pay | Admitting: Emergency Medicine

## 2019-01-12 ENCOUNTER — Ambulatory Visit (HOSPITAL_COMMUNITY)
Admission: EM | Admit: 2019-01-12 | Discharge: 2019-01-12 | Disposition: A | Discharge status: Home / Self Care | Payer: BLUE CROSS/BLUE SHIELD | Attending: Family Medicine | Admitting: Family Medicine

## 2019-01-12 ENCOUNTER — Ambulatory Visit (INDEPENDENT_AMBULATORY_CARE_PROVIDER_SITE_OTHER): Payer: BLUE CROSS/BLUE SHIELD

## 2019-01-12 DIAGNOSIS — S82891A Other fracture of right lower leg, initial encounter for closed fracture: Secondary | ICD-10-CM | POA: Insufficient documentation

## 2019-01-12 DIAGNOSIS — S99911A Unspecified injury of right ankle, initial encounter: Secondary | ICD-10-CM | POA: Diagnosis not present

## 2019-01-12 DIAGNOSIS — M25571 Pain in right ankle and joints of right foot: Secondary | ICD-10-CM | POA: Diagnosis not present

## 2019-01-12 DIAGNOSIS — W101XXA Fall (on)(from) sidewalk curb, initial encounter: Secondary | ICD-10-CM | POA: Diagnosis not present

## 2019-01-12 MED ORDER — DICLOFENAC SODIUM 75 MG PO TBEC: 75.0000 mg | DELAYED_RELEASE_TABLET | Freq: Two times a day (BID) | ORAL | 0 refills | Status: AC

## 2019-01-12 NOTE — ED Provider Notes (Signed)
[facility]    CSN: [id number] Arrival date & time: 01/12/19  1705     History   Chief Complaint Chief Complaint  Patient presents with  . Ankle Pain    HPI Mark Duarte is a 38 y.o. male.   38 year old male presents with ankle pain x1 day's right foot.  States that he was walking his dog when he fell off twisted his ankle and fell off the curb.  Pulses intact cap refill less than 2.  Swelling noted to the lateral aspect of his right lateral malleolus.  Patient is acute in nature.  Condition is made better by nothing.  Condition is made worse by weightbearing activity and movement.  Patient denies any treatment prior to arrival at this facility.  Patient has full range of motion however increased pain with lateral rotation     Past Medical History:  Diagnosis Date  . Allergy   . Asthma     There are no active problems to display for this patient.   Past Surgical History:  Procedure Laterality Date  . CYST REMOVAL TRUNK         Home Medications    Prior to Admission medications   Medication Sig Start Date End Date Taking? Authorizing Provider  albuterol (PROVENTIL HFA;VENTOLIN HFA) 108 (90 Base) MCG/ACT inhaler Inhale 2 puffs into the lungs every 4 (four) hours as needed for wheezing or shortness of breath (cough, shortness of breath or wheezing.). 01/31/18   Trena Platt D, PA  benzonatate (TESSALON) 100 MG capsule Take 1 capsule (100 mg total) by mouth every 8 (eight) hours. 01/29/18   Cathie Hoops, Amy V, PA-C  chlorpheniramine-HYDROcodone (TUSSIONEX PENNKINETIC ER) 10-8 MG/5ML SUER Take 5 mLs by mouth every 12 (twelve) hours as needed for cough. 01/31/18   Trena Platt D, PA  fluticasone (FLONASE) 50 MCG/ACT nasal spray Place 2 sprays into both nostrils daily. 01/29/18   Cathie Hoops, Amy V, PA-C  ipratropium (ATROVENT) 0.06 % nasal spray Place 2 sprays into both nostrils 4 (four) times daily. 01/29/18   Belinda Fisher, PA-C  predniSONE (DELTASONE) 20 MG tablet Take  3 PO QAM x2days, 2 PO QAM x2days, 1 PO QAM x3days 01/31/18   Trena Platt D, PA    Family History Family History  Problem Relation Age of Onset  . Diabetes Mother   . Hypertension Mother   . High Cholesterol Mother   . Hypertension Father   . High Cholesterol Father   . Cancer Maternal Grandmother   . Heart disease Maternal Grandfather   . Diabetes Maternal Grandfather   . High Cholesterol Maternal Grandfather   . Stroke Maternal Grandfather   . Mental illness Maternal Grandfather     Social History Social History   Tobacco Use  . Smoking status: Former Smoker    Last attempt to quit: 10/11/2013    Years since quitting: 5.2  . Smokeless tobacco: Never Used  Substance Use Topics  . Alcohol use: Yes    Alcohol/week: 5.0 standard drinks    Types: 5 Standard drinks or equivalent per week    Comment: 2 beers/day 5 days a week  . Drug use: No     Allergies   Patient has no known allergies.   Review of Systems Review of Systems  Constitutional: Negative for chills and fever.  HENT: Negative for ear pain and sore throat.   Eyes: Negative for pain and visual disturbance.  Respiratory: Negative for cough and shortness of breath.   Cardiovascular:  Negative for chest pain and palpitations.  Gastrointestinal: Negative for abdominal pain and vomiting.  Genitourinary: Negative for dysuria and hematuria.  Musculoskeletal: Negative for arthralgias and back pain.       Pain and swelling to right ankle  Skin: Negative for color change and rash.  Neurological: Negative for seizures and syncope.  All other systems reviewed and are negative.    Physical Exam Triage Vital Signs ED Triage Vitals  Enc Vitals Group     BP 01/12/19 1900 123/70     Pulse Rate 01/12/19 1900 89     Resp 01/12/19 1900 18     Temp 01/12/19 1900 98.7 F (37.1 C)     Temp Source 01/12/19 1900 Oral     SpO2 01/12/19 1900 95 %     Weight --      Height --      Head Circumference --      Peak  Flow --      Pain Score 01/12/19 1857 7     Pain Loc --      Pain Edu? --      Excl. in GC? --    No data found.  Updated Vital Signs BP 123/70 (BP Location: Right Arm)   Pulse 89   Temp 98.7 F (37.1 C) (Oral)   Resp 18   SpO2 95%   Visual Acuity Right Eye Distance:   Left Eye Distance:   Bilateral Distance:    Right Eye Near:   Left Eye Near:    Bilateral Near:     Physical Exam Vitals signs and nursing note reviewed.  Constitutional:      Appearance: He is well-developed.  HENT:     Head: Normocephalic.  Neck:     Musculoskeletal: Normal range of motion.  Pulmonary:     Effort: Pulmonary effort is normal.  Musculoskeletal:        General: Swelling and tenderness present.     Comments:  right lateal malleus. Cap refill < 2. pedial pulses intact   Skin:    General: Skin is dry.  Neurological:     Mental Status: He is alert and oriented to person, place, and time.      UC Treatments / Results  Labs (all labs ordered are listed, but only abnormal results are displayed) Labs Reviewed - No data to display  EKG None  Radiology No results found.  Procedures Procedures (including critical care time)  Medications Ordered in UC Medications - No data to display  Initial Impression / Assessment and Plan / UC Course  I have reviewed the triage vital signs and the nursing notes.  Pertinent labs & imaging results that were available during my care of the patient were reviewed by me and considered in my medical decision making (see chart for details).      Final Clinical Impressions(s) / UC Diagnoses   Final diagnoses:  None   Discharge Instructions   None    ED Prescriptions    None     Controlled Substance Prescriptions [place] Controlled Substance Registry consulted? Not Applicable   Alene MiresOmohundro, Taralyn Ferraiolo C, NP 01/12/19 [phone]

## 2019-01-12 NOTE — ED Triage Notes (Addendum)
Rolled right ankle while walking the dog.  Pain and swelling in ankle  Able to move toes on right foot, right dp 2 + no numbness or tingling  Abrasion to left knee

## 2019-01-12 NOTE — Discharge Instructions (Addendum)
Continue to rest ice and elevate affected extremity.

## 2019-01-15 DIAGNOSIS — S93491A Sprain of other ligament of right ankle, initial encounter: Secondary | ICD-10-CM | POA: Diagnosis not present

## 2019-06-26 DIAGNOSIS — Z20828 Contact with and (suspected) exposure to other viral communicable diseases: Secondary | ICD-10-CM | POA: Diagnosis not present

## 2019-06-28 DIAGNOSIS — Z20828 Contact with and (suspected) exposure to other viral communicable diseases: Secondary | ICD-10-CM | POA: Diagnosis not present

## 2019-07-09 DIAGNOSIS — L739 Follicular disorder, unspecified: Secondary | ICD-10-CM | POA: Diagnosis not present

## 2019-12-05 ENCOUNTER — Ambulatory Visit: Payer: BC Managed Care – PPO | Attending: Internal Medicine

## 2019-12-05 DIAGNOSIS — Z20822 Contact with and (suspected) exposure to covid-19: Secondary | ICD-10-CM

## 2019-12-06 LAB — NOVEL CORONAVIRUS, NAA: SARS-CoV-2, NAA: NOT DETECTED

## 2019-12-09 ENCOUNTER — Other Ambulatory Visit: Payer: BLUE CROSS/BLUE SHIELD

## 2020-12-02 DIAGNOSIS — B349 Viral infection, unspecified: Secondary | ICD-10-CM | POA: Diagnosis not present

## 2020-12-02 DIAGNOSIS — R0981 Nasal congestion: Secondary | ICD-10-CM | POA: Diagnosis not present

## 2020-12-02 DIAGNOSIS — Z03818 Encounter for observation for suspected exposure to other biological agents ruled out: Secondary | ICD-10-CM | POA: Diagnosis not present

## 2020-12-16 DIAGNOSIS — Z20822 Contact with and (suspected) exposure to covid-19: Secondary | ICD-10-CM | POA: Diagnosis not present

## 2021-05-25 DIAGNOSIS — H6121 Impacted cerumen, right ear: Secondary | ICD-10-CM | POA: Diagnosis not present

## 2021-08-20 DIAGNOSIS — R109 Unspecified abdominal pain: Secondary | ICD-10-CM | POA: Diagnosis not present

## 2021-08-20 DIAGNOSIS — R10826 Epigastric rebound abdominal tenderness: Secondary | ICD-10-CM | POA: Diagnosis not present

## 2021-08-20 DIAGNOSIS — R1031 Right lower quadrant pain: Secondary | ICD-10-CM | POA: Diagnosis not present

## 2021-12-06 DIAGNOSIS — J029 Acute pharyngitis, unspecified: Secondary | ICD-10-CM | POA: Diagnosis not present

## 2021-12-06 DIAGNOSIS — R059 Cough, unspecified: Secondary | ICD-10-CM | POA: Diagnosis not present

## 2021-12-06 DIAGNOSIS — R5383 Other fatigue: Secondary | ICD-10-CM | POA: Diagnosis not present

## 2022-01-10 DIAGNOSIS — B349 Viral infection, unspecified: Secondary | ICD-10-CM | POA: Diagnosis not present

## 2022-01-10 DIAGNOSIS — R112 Nausea with vomiting, unspecified: Secondary | ICD-10-CM | POA: Diagnosis not present

## 2022-01-31 DIAGNOSIS — J019 Acute sinusitis, unspecified: Secondary | ICD-10-CM | POA: Diagnosis not present

## 2022-02-11 DIAGNOSIS — J029 Acute pharyngitis, unspecified: Secondary | ICD-10-CM | POA: Diagnosis not present

## 2022-03-04 DIAGNOSIS — J029 Acute pharyngitis, unspecified: Secondary | ICD-10-CM | POA: Diagnosis not present

## 2022-03-04 DIAGNOSIS — Z20822 Contact with and (suspected) exposure to covid-19: Secondary | ICD-10-CM | POA: Diagnosis not present

## 2022-03-29 DIAGNOSIS — R059 Cough, unspecified: Secondary | ICD-10-CM | POA: Diagnosis not present

## 2022-04-01 ENCOUNTER — Encounter (HOSPITAL_COMMUNITY): Payer: Self-pay

## 2022-04-01 ENCOUNTER — Inpatient Hospital Stay (HOSPITAL_COMMUNITY): Payer: BC Managed Care – PPO

## 2022-04-01 ENCOUNTER — Inpatient Hospital Stay (HOSPITAL_COMMUNITY)
Admission: EM | Admit: 2022-04-01 | Discharge: 2022-04-04 | DRG: 813 | Disposition: A | Discharge status: Home / Self Care | Payer: BC Managed Care – PPO | Attending: Family Medicine | Admitting: Family Medicine

## 2022-04-01 DIAGNOSIS — R233 Spontaneous ecchymoses: Secondary | ICD-10-CM | POA: Diagnosis not present

## 2022-04-01 DIAGNOSIS — R590 Localized enlarged lymph nodes: Secondary | ICD-10-CM

## 2022-04-01 DIAGNOSIS — R7881 Bacteremia: Secondary | ICD-10-CM | POA: Diagnosis not present

## 2022-04-01 DIAGNOSIS — J9811 Atelectasis: Secondary | ICD-10-CM | POA: Diagnosis present

## 2022-04-01 DIAGNOSIS — E669 Obesity, unspecified: Secondary | ICD-10-CM

## 2022-04-01 DIAGNOSIS — B349 Viral infection, unspecified: Secondary | ICD-10-CM | POA: Diagnosis not present

## 2022-04-01 DIAGNOSIS — R058 Other specified cough: Secondary | ICD-10-CM | POA: Diagnosis not present

## 2022-04-01 DIAGNOSIS — Z87891 Personal history of nicotine dependence: Secondary | ICD-10-CM

## 2022-04-01 DIAGNOSIS — M4319 Spondylolisthesis, multiple sites in spine: Secondary | ICD-10-CM | POA: Diagnosis not present

## 2022-04-01 DIAGNOSIS — Z8249 Family history of ischemic heart disease and other diseases of the circulatory system: Secondary | ICD-10-CM | POA: Diagnosis not present

## 2022-04-01 DIAGNOSIS — D6959 Other secondary thrombocytopenia: Principal | ICD-10-CM | POA: Diagnosis present

## 2022-04-01 DIAGNOSIS — R9389 Abnormal findings on diagnostic imaging of other specified body structures: Secondary | ICD-10-CM | POA: Diagnosis not present

## 2022-04-01 DIAGNOSIS — R042 Hemoptysis: Secondary | ICD-10-CM | POA: Diagnosis not present

## 2022-04-01 DIAGNOSIS — Z20822 Contact with and (suspected) exposure to covid-19: Secondary | ICD-10-CM | POA: Diagnosis present

## 2022-04-01 DIAGNOSIS — D709 Neutropenia, unspecified: Secondary | ICD-10-CM

## 2022-04-01 DIAGNOSIS — Z823 Family history of stroke: Secondary | ICD-10-CM

## 2022-04-01 DIAGNOSIS — Z6836 Body mass index (BMI) 36.0-36.9, adult: Secondary | ICD-10-CM | POA: Diagnosis not present

## 2022-04-01 DIAGNOSIS — Z83438 Family history of other disorder of lipoprotein metabolism and other lipidemia: Secondary | ICD-10-CM

## 2022-04-01 DIAGNOSIS — R17 Unspecified jaundice: Secondary | ICD-10-CM | POA: Diagnosis present

## 2022-04-01 DIAGNOSIS — D696 Thrombocytopenia, unspecified: Principal | ICD-10-CM | POA: Diagnosis present

## 2022-04-01 DIAGNOSIS — S301XXA Contusion of abdominal wall, initial encounter: Secondary | ICD-10-CM | POA: Diagnosis not present

## 2022-04-01 DIAGNOSIS — R519 Headache, unspecified: Secondary | ICD-10-CM | POA: Diagnosis not present

## 2022-04-01 DIAGNOSIS — Z833 Family history of diabetes mellitus: Secondary | ICD-10-CM

## 2022-04-01 DIAGNOSIS — T148XXA Other injury of unspecified body region, initial encounter: Secondary | ICD-10-CM | POA: Diagnosis not present

## 2022-04-01 LAB — CBC WITH DIFFERENTIAL/PLATELET
Abs Immature Granulocytes: 0 10*3/uL (ref 0.00–0.07)
Basophils Absolute: 0 10*3/uL (ref 0.0–0.1)
Basophils Relative: 1 %
Eosinophils Absolute: 0.1 10*3/uL (ref 0.0–0.5)
Eosinophils Relative: 3 %
HCT: 38.7 % — ABNORMAL LOW (ref 39.0–52.0)
Hemoglobin: 13.7 g/dL (ref 13.0–17.0)
Immature Granulocytes: 0 %
Lymphocytes Relative: 77 %
Lymphs Abs: 1.7 10*3/uL (ref 0.7–4.0)
MCH: 31.3 pg (ref 26.0–34.0)
MCHC: 35.4 g/dL (ref 30.0–36.0)
MCV: 88.4 fL (ref 80.0–100.0)
Monocytes Absolute: 0.4 10*3/uL (ref 0.1–1.0)
Monocytes Relative: 17 %
Neutro Abs: 0.1 10*3/uL — CL (ref 1.7–7.7)
Neutrophils Relative %: 2 %
Platelets: 8 10*3/uL — CL (ref 150–400)
RBC: 4.38 MIL/uL (ref 4.22–5.81)
RDW: 12.2 % (ref 11.5–15.5)
WBC: 2.2 10*3/uL — ABNORMAL LOW (ref 4.0–10.5)
nRBC: 0 % (ref 0.0–0.2)

## 2022-04-01 LAB — COMPREHENSIVE METABOLIC PANEL
ALT: 33 U/L (ref 0–44)
AST: 22 U/L (ref 15–41)
Albumin: 4.1 g/dL (ref 3.5–5.0)
Alkaline Phosphatase: 54 U/L (ref 38–126)
Anion gap: 7 (ref 5–15)
BUN: 21 mg/dL — ABNORMAL HIGH (ref 6–20)
CO2: 26 mmol/L (ref 22–32)
Calcium: 8.9 mg/dL (ref 8.9–10.3)
Chloride: 107 mmol/L (ref 98–111)
Creatinine, Ser: 1.14 mg/dL (ref 0.61–1.24)
GFR, Estimated: >60 mL/min (ref >60)
Glucose, Bld: 138 mg/dL — ABNORMAL HIGH (ref 70–99)
Potassium: 3.9 mmol/L (ref 3.5–5.1)
Sodium: 140 mmol/L (ref 135–145)
Total Bilirubin: 1.4 mg/dL — ABNORMAL HIGH (ref 0.3–1.2)
Total Protein: 7.2 g/dL (ref 6.5–8.1)

## 2022-04-01 LAB — APTT: aPTT: 28 seconds (ref 24–36)

## 2022-04-01 LAB — PROTIME-INR
INR: 1 (ref 0.8–1.2)
Prothrombin Time: 12.7 seconds (ref 11.4–15.2)

## 2022-04-01 MED ORDER — IOHEXOL 350 MG/ML SOLN
100.0000 mL | Freq: Once | INTRAVENOUS | Status: AC | PRN
  Administered 2022-04-01: 100 mL via INTRAVENOUS

## 2022-04-01 MED ORDER — SODIUM CHLORIDE 0.9 % IV SOLN
2.0000 g | Freq: Once | INTRAVENOUS | Status: AC
  Administered 2022-04-01: 2 g via INTRAVENOUS
  Filled 2022-04-01: qty 12.5

## 2022-04-01 MED ORDER — SODIUM CHLORIDE 0.9 % IV SOLN: 10.0000 mL/h | Freq: Once | INTRAVENOUS | Status: DC

## 2022-04-01 MED ORDER — LACTATED RINGERS IV SOLN: INTRAVENOUS | Status: DC

## 2022-04-01 MED ORDER — METRONIDAZOLE 500 MG PO TABS
500.0000 mg | ORAL_TABLET | Freq: Two times a day (BID) | ORAL | Status: DC
  Administered 2022-04-01 – 2022-04-02 (×2): 500 mg via ORAL
  Filled 2022-04-01 (×2): qty 1

## 2022-04-01 MED ORDER — SODIUM CHLORIDE (PF) 0.9 % IJ SOLN
INTRAMUSCULAR | Status: AC
  Filled 2022-04-01: qty 50

## 2022-04-01 MED ORDER — VANCOMYCIN HCL 10 G IV SOLR
2500.0000 mg | Freq: Once | INTRAVENOUS | Status: AC
  Administered 2022-04-02: 2500 mg via INTRAVENOUS
  Filled 2022-04-01: qty 25
  Filled 2022-04-01: qty 2500

## 2022-04-01 NOTE — H&P (Signed)
?History and Physical ? ?Mark HurtBrandon A Guile GEX:[id number]:[id number] DOB: January 04, 1981 DOA: 04/01/2022 ? ?Referring physician: Dr. Estell HarpinZammit, EDP  ?PCP: Patient, No Pcp Per (Inactive)  ?Outpatient Specialists: None ?Patient coming from: Home ? ?Chief Complaint: PD cath spontaneous bruising. ? ?HPI: Mark Duarte is a 41 y.o. male with no significant past medical history, who was advised by his primary care provider to go to the ED due to severe thrombocytopenia.  History is obtained from the patient.  A month ago he had a fever and noticed a lump on the lateral right side of his tongue.  He was seen by his PCP then sent to a dentist, was prescribed antibiotics and the lesion resolved a week later.  4 days ago he noted left flank pain and bruising without trauma.  Went to see his PCP.  Had a cough at the time, with some mild hemoptysis, was thought to be from a viral infection and hemoptysis was thought to be from coughing too hard.  He was prescribed antitussives.  The following day he noticed petechiae in his legs, thighs, abdomen.  Associated with light bleeding from his tongue.  He returned to his PCP, blood work was done and he was found to be severely thrombocytopenic with platelet count of 9000.  He was advised to go to the ED for further evaluation.  Repeated blood work in the ED revealed platelet count of 8000.  EDP discussed case with hematology/oncology who recommended admission, transfusion of platelet unit and to obtain blood cultures.  They will see the patient in consultation in the morning.  TR[facility] was asked to admit.  ? ?ED Course: Tmax 99.  BP 128/71, pulse 78, respiratory 17, saturation 94% on room air. ? ?Review of Systems: ?Review of systems as noted in the HPI. All other systems reviewed and are negative. ? ? ?Past Medical History:  ?Diagnosis Date  ? Allergy   ? Asthma   ? ?Past Surgical History:  ?Procedure Laterality Date  ? CYST REMOVAL TRUNK    ? ? ?Social History:  reports that he quit smoking about 8 years  ago. He has never used smokeless tobacco. He reports current alcohol use of about 5.0 standard drinks per week. He reports that he does not use drugs. ? ? ?No Known Allergies ? ?Family History  ?Problem Relation Age of Onset  ? Diabetes Mother   ? Hypertension Mother   ? High Cholesterol Mother   ? Hypertension Father   ? High Cholesterol Father   ? Cancer Maternal Grandmother   ? Heart disease Maternal Grandfather   ? Diabetes Maternal Grandfather   ? High Cholesterol Maternal Grandfather   ? Stroke Maternal Grandfather   ? Mental illness Maternal Grandfather   ?Father with DVT. ? ? ?Prior to Admission medications   ?Medication Sig Start Date End Date Taking? Authorizing Provider  ?benzonatate (TESSALON) 100 MG capsule Take 1 capsule (100 mg total) by mouth every 8 (eight) hours. ?Patient taking differently: Take 100 mg by mouth 3 (three) times daily as needed for cough. 01/29/18  Yes Yu, Amy V, PA-C  ?fexofenadine (ALLEGRA) 180 MG tablet Take 180 mg by mouth daily as needed for allergies or rhinitis.   Yes [provider]  ?fluticasone (FLONASE) 50 MCG/ACT nasal spray Place 2 sprays into both nostrils daily. ?Patient taking differently: Place 2 sprays into both nostrils daily as needed for allergies or rhinitis. 01/29/18  Yes Yu, Amy V, PA-C  ?HYDROMET 5-1.5 MG/5ML syrup Take 5 mLs by  mouth at bedtime as needed for cough.   Yes [provider]  ?albuterol (PROVENTIL HFA;VENTOLIN HFA) 108 (90 Base) MCG/ACT inhaler Inhale 2 puffs into the lungs every 4 (four) hours as needed for wheezing or shortness of breath (cough, shortness of breath or wheezing.). ?Patient not taking: Reported on 04/01/2022 01/31/18   Trena Platt D, PA  ?chlorpheniramine-HYDROcodone [facility] ER) 10-8 MG/5ML SUER Take 5 mLs by mouth every 12 (twelve) hours as needed for cough. ?Patient not taking: Reported on 04/01/2022 01/31/18   Trena Platt D, PA  ?ipratropium (ATROVENT) 0.06 % nasal spray Place 2 sprays  into both nostrils 4 (four) times daily. ?Patient not taking: Reported on 04/01/2022 01/29/18   Belinda Fisher, PA-C  ?predniSONE (DELTASONE) 20 MG tablet Take 3 PO QAM x2days, 2 PO QAM x2days, 1 PO QAM x3days ?Patient not taking: Reported on 04/01/2022 01/31/18   Trena Platt D, PA  ? ? ?Physical Exam: ?BP 120/69   Pulse 80   Temp 97.6 ?F (36.4 ?C)   Resp 16   Ht 5\' 7"  (1.702 m)   Wt 104.3 kg   SpO2 96%   BMI 36.02 kg/m?  ? ?General: 41 y.o. year-old male well developed well nourished in no acute distress.  Alert and oriented x3. ?Cardiovascular: Regular rate and rhythm with no rubs or gallops.  No thyromegaly or JVD noted.  No lower extremity edema. 2/4 pulses in all 4 extremities. ?Respiratory: Clear to auscultation with no wheezes or rales. Good inspiratory effort. ?Abdomen: Soft nontender nondistended with normal bowel sounds x4 quadrants. ?Muskuloskeletal: No cyanosis, clubbing or edema noted bilaterally ?Neuro: CN II-XII intact, strength, sensation, reflexes ?Skin: Diffuse petechial rash and bruising. ?Psychiatry: Judgement and insight appear normal. Mood is appropriate for condition and setting ?   ?   ?   ?Labs on Admission:  ?Basic Metabolic Panel: ?Recent Labs  ?Lab 04/01/22 ?1825 04/02/22 ?0306  ?NA 140 140  ?K 3.9 3.7  ?CL 107 107  ?CO2 26 26  ?GLUCOSE 138* 112*  ?BUN 21* 19  ?CREATININE 1.14 0.84  ?CALCIUM 8.9 8.5*  ?MG  --  2.5*  ?PHOS  --  3.6  ? ?Liver Function Tests: ?Recent Labs  ?Lab 04/01/22 ?1825 04/02/22 ?0306  ?AST 22 15  ?ALT 33 22  ?ALKPHOS 54 47  ?BILITOT 1.4* 1.5*  ?PROT 7.2 6.4*  ?ALBUMIN 4.1 3.6  ? ?No results for input(s): LIPASE, AMYLASE in the last 168 hours. ?No results for input(s): AMMONIA in the last 168 hours. ?CBC: ?Recent Labs  ?Lab 04/01/22 ?1825 04/02/22 ?0306  ?WBC 2.2* 2.5*  ?NEUTROABS 0.1*  --   ?HGB 13.7 12.9*  ?HCT 38.7* 37.2*  ?MCV 88.4 88.4  ?PLT 8* 16*  ? ?Cardiac Enzymes: ?No results for input(s): CKTOTAL, CKMB, CKMBINDEX, TROPONINI in the last 168 hours. ? ?BNP  (last 3 results) ?No results for input(s): BNP in the last 8760 hours. ? ?ProBNP (last 3 results) ?No results for input(s): PROBNP in the last 8760 hours. ? ?CBG: ?No results for input(s): GLUCAP in the last 168 hours. ? ?Radiological Exams on Admission: ?CT HEAD WO CONTRAST (04/04/22) ? ?Result Date: 04/01/2022 ?CLINICAL DATA:  Headache, sudden, severe. Severe thrombocytopenia with oral petechiae with concern for possible intracranial hemorrhage EXAM: CT HEAD WITHOUT CONTRAST TECHNIQUE: Contiguous axial images were obtained from the base of the skull through the vertex without intravenous contrast. RADIATION DOSE REDUCTION: This exam was performed according to the departmental dose-optimization program which includes automated exposure control, adjustment of  the mA and/or kV according to patient size and/or use of iterative reconstruction technique. COMPARISON:  None. FINDINGS: Brain: Normal anatomic configuration. No abnormal intra or extra-axial mass lesion or fluid collection. No abnormal mass effect or midline shift. No evidence of acute intracranial hemorrhage or infarct. Ventricular size is normal. Cerebellum unremarkable. Vascular: Unremarkable Skull: Intact Sinuses/Orbits: Paranasal sinuses are clear. Orbits are unremarkable. Other: Mastoid air cells and middle ear cavities are clear. IMPRESSION: Normal examination.  No acute intracranial hemorrhage. Electronically Signed   By: Helyn Numbers M.D.   On: 04/01/2022 22:53  ? ?CT Angio Chest/Abd/Pel for Dissection W and/or W/WO ? ?Result Date: 04/01/2022 ?CLINICAL DATA:  Severe thrombocytopenia with spontaneous bruising and petechiae. Also with severe neutropenia. Reported occasional hemoptysis, L flank pain and brusing, atraumatic EXAM: CT ANGIOGRAPHY CHEST, ABDOMEN AND PELVIS TECHNIQUE: Non-contrast CT of the chest was initially obtained. Multidetector CT imaging through the chest, abdomen and pelvis was performed using the standard protocol during bolus  administration of intravenous contrast. Multiplanar reconstructed images and MIPs were obtained and reviewed to evaluate the vascular anatomy. RADIATION DOSE REDUCTION: This exam was performed according to the depar

## 2022-04-01 NOTE — ED Triage Notes (Signed)
Pt arrived via POV, c/o easily bruising for the last couple days. Was seen at PCP and told platelet count was low.  ?

## 2022-04-01 NOTE — ED Provider Triage Note (Signed)
Emergency Medicine Provider Triage Evaluation Note ? ?Mark Duarte , a 41 y.o. male  was evaluated in triage.  Pt complains of bruising easily.  Patient states that he has a young child at home and has been getting frequent viral illnesses over the past 6 months.  Earlier this week he was seen by his PCP for cough and was treated for this.  He also noted that he was having red spots on his legs and had been bruising easily for the past 6 months so he went back to his PCP this morning.  They drew basic lab work and he was called this afternoon and told that his platelets "were 9000" and told to come to the emergency department for further evaluation.  Denies any chest pain, shortness of breath, syncope.  Does endorse fatigue but states that he works in [employer] and frequently works late at night so it is difficult to tell if the fatigue is a new symptom. ? ?Physical Exam  ?BP 133/87   Pulse (!) 101   Temp 98.2 ?F (36.8 ?C) (Oral)   Resp 18   SpO2 95%  ?Gen:   Awake, no distress   ?Resp:  Normal effort  ?MSK:   Moves extremities without difficulty  ?Other:  Petechiae and scattered bruising noted to the lower legs. ? ?Medical Decision Making  ?Medically screening exam initiated at 6:24 PM.  Appropriate orders placed.  Mark Duarte was informed that the remainder of the evaluation will be completed by another provider, this initial triage assessment does not replace that evaluation, and the importance of remaining in the ED until their evaluation is complete. ?  ?Placido Sou, PA-C ?04/01/22 1826 ? ?

## 2022-04-01 NOTE — Progress Notes (Signed)
Pharmacy Antibiotic Note [name] is a 41 y.o. male admitted on 04/01/2022 with sepsis.  Pharmacy has been consulted for Cefepime + Vancomycin dosing. ? ?Plan: ?Cefepime 2gm IV q8h ?Vancomycin 2500mg  IV x1 then 1gm IV q12h to target AUC 400-550 ?Vancomycin levels at steady state ?Monitor renal function and cx data  ? ?Height: 5\' 7"  (170.2 cm) ?Weight: 104.3 kg (230 lb) ?IBW/kg (Calculated) : 66.1 ? ?Temp (24hrs), Avg:98.6 ?F (37 ?C), Min:98.2 ?F (36.8 ?C), Max:99 ?F (37.2 ?C) ? ?Recent Labs  ?Lab 04/01/22 ?1825  ?WBC 2.2*  ?CREATININE 1.14  ?  ?Estimated Creatinine Clearance: 98.2 mL/min (by C-G formula based on SCr of 1.14 mg/dL).   ? ?No Known Allergies ? ?Antimicrobials this admission: ?4/14 Cefepime >>  ?4/14 Vancomycin >>  ?4/14 Metronidazole ? ?Dose adjustments this admission: ? ?Microbiology results: ?4/14 BCx:  ? ?Thank you for allowing pharmacy to be a part of this [facility]. ? ?5/14 PharmD ?04/01/2022 10:22 PM ? ?

## 2022-04-02 ENCOUNTER — Other Ambulatory Visit: Payer: Self-pay

## 2022-04-02 DIAGNOSIS — D696 Thrombocytopenia, unspecified: Secondary | ICD-10-CM | POA: Diagnosis not present

## 2022-04-02 DIAGNOSIS — R9389 Abnormal findings on diagnostic imaging of other specified body structures: Secondary | ICD-10-CM | POA: Diagnosis not present

## 2022-04-02 DIAGNOSIS — Z87891 Personal history of nicotine dependence: Secondary | ICD-10-CM

## 2022-04-02 DIAGNOSIS — D709 Neutropenia, unspecified: Secondary | ICD-10-CM

## 2022-04-02 DIAGNOSIS — R590 Localized enlarged lymph nodes: Secondary | ICD-10-CM

## 2022-04-02 DIAGNOSIS — R233 Spontaneous ecchymoses: Secondary | ICD-10-CM

## 2022-04-02 DIAGNOSIS — E669 Obesity, unspecified: Secondary | ICD-10-CM

## 2022-04-02 LAB — BLOOD CULTURE ID PANEL (REFLEXED) - BCID2

## 2022-04-02 LAB — COMPREHENSIVE METABOLIC PANEL
ALT: 22 U/L (ref 0–44)
AST: 15 U/L (ref 15–41)
Albumin: 3.6 g/dL (ref 3.5–5.0)
Alkaline Phosphatase: 47 U/L (ref 38–126)
Anion gap: 7 (ref 5–15)
BUN: 19 mg/dL (ref 6–20)
CO2: 26 mmol/L (ref 22–32)
Calcium: 8.5 mg/dL — ABNORMAL LOW (ref 8.9–10.3)
Chloride: 107 mmol/L (ref 98–111)
Creatinine, Ser: 0.84 mg/dL (ref 0.61–1.24)
GFR, Estimated: >60 mL/min (ref >60)
Glucose, Bld: 112 mg/dL — ABNORMAL HIGH (ref 70–99)
Potassium: 3.7 mmol/L (ref 3.5–5.1)
Sodium: 140 mmol/L (ref 135–145)
Total Bilirubin: 1.5 mg/dL — ABNORMAL HIGH (ref 0.3–1.2)
Total Protein: 6.4 g/dL — ABNORMAL LOW (ref 6.5–8.1)

## 2022-04-02 LAB — RESPIRATORY PANEL BY PCR

## 2022-04-02 LAB — CBC
HCT: 37.2 % — ABNORMAL LOW (ref 39.0–52.0)
Hemoglobin: 12.9 g/dL — ABNORMAL LOW (ref 13.0–17.0)
MCH: 30.6 pg (ref 26.0–34.0)
MCHC: 34.7 g/dL (ref 30.0–36.0)
MCV: 88.4 fL (ref 80.0–100.0)
Platelets: 16 10*3/uL — CL (ref 150–400)
RBC: 4.21 MIL/uL — ABNORMAL LOW (ref 4.22–5.81)
RDW: 12.3 % (ref 11.5–15.5)
WBC: 2.5 10*3/uL — ABNORMAL LOW (ref 4.0–10.5)
nRBC: 0 % (ref 0.0–0.2)

## 2022-04-02 LAB — TYPE AND SCREEN
ABO/RH(D): A POS
Antibody Screen: NEGATIVE

## 2022-04-02 LAB — MAGNESIUM: Magnesium: 2.5 mg/dL — ABNORMAL HIGH (ref 1.7–2.4)

## 2022-04-02 LAB — MRSA NEXT GEN BY PCR, NASAL: MRSA by PCR Next Gen: NOT DETECTED

## 2022-04-02 LAB — ABO/RH: ABO/RH(D): A POS

## 2022-04-02 LAB — PHOSPHORUS: Phosphorus: 3.6 mg/dL (ref 2.5–4.6)

## 2022-04-02 LAB — BILIRUBIN, FRACTIONATED(TOT/DIR/INDIR)
Bilirubin, Direct: 0.2 mg/dL (ref 0.0–0.2)
Indirect Bilirubin: 1.2 mg/dL — ABNORMAL HIGH (ref 0.3–0.9)
Total Bilirubin: 1.4 mg/dL — ABNORMAL HIGH (ref 0.3–1.2)

## 2022-04-02 LAB — RESP PANEL BY RT-PCR (FLU A&B, COVID) ARPGX2
Influenza A by PCR: NEGATIVE
Influenza B by PCR: NEGATIVE
SARS Coronavirus 2 by RT PCR: NEGATIVE

## 2022-04-02 LAB — PROCALCITONIN: Procalcitonin: <0.1 ng/mL

## 2022-04-02 MED ORDER — ACETAMINOPHEN 325 MG PO TABS: 650.0000 mg | ORAL_TABLET | Freq: Four times a day (QID) | ORAL | Status: DC | PRN

## 2022-04-02 MED ORDER — MELATONIN 3 MG PO TABS: 3.0000 mg | ORAL_TABLET | Freq: Every evening | ORAL | Status: DC | PRN

## 2022-04-02 MED ORDER — VANCOMYCIN HCL IN DEXTROSE 1-5 GM/200ML-% IV SOLN
1000.0000 mg | Freq: Two times a day (BID) | INTRAVENOUS | Status: DC
  Administered 2022-04-02: 1000 mg via INTRAVENOUS
  Filled 2022-04-02 (×2): qty 200

## 2022-04-02 MED ORDER — BENZONATATE 100 MG PO CAPS: 100.0000 mg | ORAL_CAPSULE | Freq: Three times a day (TID) | ORAL | Status: DC | PRN

## 2022-04-02 MED ORDER — POLYETHYLENE GLYCOL 3350 17 G PO PACK: 17.0000 g | PACK | Freq: Every day | ORAL | Status: DC | PRN

## 2022-04-02 MED ORDER — FLUTICASONE PROPIONATE 50 MCG/ACT NA SUSP: 2.0000 | Freq: Every day | NASAL | Status: DC | PRN

## 2022-04-02 MED ORDER — CHLORHEXIDINE GLUCONATE CLOTH 2 % EX PADS
6.0000 | MEDICATED_PAD | Freq: Every day | CUTANEOUS | Status: DC
  Administered 2022-04-02 – 2022-04-04 (×3): 6 via TOPICAL

## 2022-04-02 MED ORDER — PROCHLORPERAZINE EDISYLATE 10 MG/2ML IJ SOLN: 10.0000 mg | Freq: Four times a day (QID) | INTRAMUSCULAR | Status: DC | PRN

## 2022-04-02 MED ORDER — OXYCODONE HCL 5 MG PO TABS: 5.0000 mg | ORAL_TABLET | Freq: Four times a day (QID) | ORAL | Status: DC | PRN

## 2022-04-02 MED ORDER — SODIUM CHLORIDE 0.9 % IV SOLN
2.0000 g | Freq: Three times a day (TID) | INTRAVENOUS | Status: DC
  Administered 2022-04-02 (×2): 2 g via INTRAVENOUS
  Filled 2022-04-02 (×5): qty 12.5

## 2022-04-02 NOTE — Assessment & Plan Note (Signed)
Body mass index is 36.02 kg/m.

## 2022-04-02 NOTE — Assessment & Plan Note (Addendum)
Platelet count of 8,000 on admission with petechiae noted. CT head obtained and was without intracranial bleeding. Hematology/oncology consulted. 1 unit of platelets transfused with rebound platelet count to 16,000. Platelets up to 28,000 prior to discharge. Hematology/oncology assessment is process is likely secondary to acute illness but still considering possible malignancy if presentation does not completely improve and will follow-up as an outpatient. morning on CBC. ?

## 2022-04-02 NOTE — Hospital Course (Addendum)
Mark Duarte is a 41 y.o. male with no significant history. Patient presented secondary to evidence of thrombocytopenia and neutropenia. No associated bleeding. Hematology/oncology consulted. Empiric antibiotics initiated. 1 unit of platelets transfused to date. Platelets stabilized and improved prior to discharge. ?

## 2022-04-02 NOTE — Assessment & Plan Note (Addendum)
Likely secondary to thrombocytopenia. Improved today. ?

## 2022-04-02 NOTE — Consult Note (Signed)
?Reason for the request:    Thrombocytopenia, neutropenia ? ?HPI: I was asked by Dr. Roderic Palau to evaluate Mark Duarte for severe thrombocytopenia and neutropenia.  He is a 41 year old man without any significant comorbid condition presented to the emergency department on April 01, 2022 based on recommendation from his PCP for severe thrombocytopenia.  He has been reporting symptoms of cough and recurrent infection attributed to sick contact and he is 41-year-old.  He was a started on antibiotics including Levaquin which was concluded 2 to 3 weeks ago.  4 days ago he noted to have flank pain and increased bruising as well as a blood blister in his mouth.  CBC obtained by his primary care provider for a platelet count of 9000 and was referred to the emergency department at that time.  CBC obtained on April 01, 2022 showed a white cell count of 2.2, hemoglobin 13.7 and a platelet count of 8.  His differential showed predominantly lymphocytes with severe neutropenia.  CT scan chest abdomen pelvis did not show any evidence of lymphadenopathy, splenomegaly or acute pathology.  He did have infiltrative process in the lung that is suspicious for atypical infection.  He was started on antibiotics utilizing cefepime and vancomycin as well as Flagyl.  He was also transfused 1 unit of platelets. ? ?Repeat CBC on April 02, 2022 showed a white cell count of 2.5 hemoglobin of 12.9 and a platelet count of 16.  Clinically, he reports feeling about the same.  He denies any fevers, chills or sweats.  He still rather fatigued and was not able to sleep well.  He still have petechial hemorrhage although no ecchymosis.  No oral bleeding. ? ?He does not report any headaches, blurry vision, syncope or seizures. Does not report any fevers, chills or sweats.  Does not report any cough, wheezing or hemoptysis.  Does not report any chest pain, palpitation, orthopnea or leg edema.  Does not report any nausea, vomiting or abdominal pain.  Does not  report any constipation or diarrhea.  Does not report any skeletal complaints.    Does not report frequency, urgency or hematuria.  Does not report any skin rashes or lesions. Does not report any heat or cold intolerance.  Does not report any lymphadenopathy or petechiae.  Does not report any anxiety or depression.  Remaining review of systems is negative.  ? ? ? ?Past Medical History:  ?Diagnosis Date  ? Allergy   ? Asthma   ?: ? ? ?Past Surgical History:  ?Procedure Laterality Date  ? CYST REMOVAL TRUNK    ?: ? ? ?Current Facility-Administered Medications:  ?  0.9 %  sodium chloride infusion, 10 mL/hr, Intravenous, Once, Mark Ferguson, MD ?  acetaminophen (TYLENOL) tablet 650 mg, 650 mg, Oral, Q6H PRN, Kayleen Memos, DO ?  benzonatate (TESSALON) capsule 100 mg, 100 mg, Oral, TID PRN, Irene Pap N, DO ?  ceFEPIme (MAXIPIME) 2 g in sodium chloride 0.9 % 100 mL IVPB, 2 g, Intravenous, Q8H, Hall, Carole N, DO, Stopped at 04/02/22 0820 ?  Chlorhexidine Gluconate Cloth 2 % PADS 6 each, 6 each, Topical, Daily, Nettey, Evalee Jefferson, MD ?  fluticasone (FLONASE) 50 MCG/ACT nasal spray 2 spray, 2 spray, Each Nare, Daily PRN, Kayleen Memos, DO ?  lactated ringers infusion, , Intravenous, Continuous, Kayleen Memos, DO, Last Rate: 50 mL/hr at 04/02/22 [id number], Infusion Verify at 04/02/22 [id number] ?  melatonin tablet 3 mg, 3 mg, Oral, QHS PRN, Kayleen Memos, DO ?  metroNIDAZOLE (FLAGYL) tablet 500 mg, 500 mg, Oral, Q12H, Hall, Carole N, DO, 500 mg at 04/01/22 2333 ?  oxyCODONE (Oxy IR/ROXICODONE) immediate release tablet 5 mg, 5 mg, Oral, Q6H PRN, [place] Crane, Carole N, DO ?  polyethylene glycol (MIRALAX / GLYCOLAX) packet 17 g, 17 g, Oral, Daily PRN, Irene Pap N, DO ?  prochlorperazine (COMPAZINE) injection 10 mg, 10 mg, Intravenous, Q6H PRN, Irene Pap N, DO ?  sodium chloride (PF) 0.9 % injection, , , ,  ?  vancomycin (VANCOCIN) IVPB 1000 mg/200 mL premix, 1,000 mg, Intravenous, Q12H, Thomes Lolling, RPH: ? ?No Known  Allergies: ? ? ?Family History  ?Problem Relation Age of Onset  ? Diabetes Mother   ? Hypertension Mother   ? High Cholesterol Mother   ? Hypertension Father   ? High Cholesterol Father   ? Cancer Maternal Grandmother   ? Heart disease Maternal Grandfather   ? Diabetes Maternal Grandfather   ? High Cholesterol Maternal Grandfather   ? Stroke Maternal Grandfather   ? Mental illness Maternal Grandfather   ?: ? ? ?Social History  ? ?Socioeconomic History  ? Marital status: Single  ?  Spouse name: Not on file  ? Number of children: Not on file  ? Years of education: Not on file  ? Highest education level: Not on file  ?Occupational History  ? Not on file  ?Tobacco Use  ? Smoking status: Former  ?  Types: Cigarettes  ?  Quit date: 10/11/2013  ?  Years since quitting: 8.4  ? Smokeless tobacco: Never  ?Vaping Use  ? Vaping Use: Never used  ?Substance and Sexual Activity  ? Alcohol use: Yes  ?  Alcohol/week: 5.0 standard drinks  ?  Types: 5 Standard drinks or equivalent per week  ?  Comment: 2 beers/day 5 days a week  ? Drug use: No  ? Sexual activity: Not on file  ?Other Topics Concern  ? Not on file  ?Social History Narrative  ? Not on file  ? ?Social Determinants of Health  ? ?Financial Resource Strain: Not on file  ?Food Insecurity: Not on file  ?Transportation Needs: Not on file  ?Physical Activity: Not on file  ?Stress: Not on file  ?Social Connections: Not on file  ?Intimate Partner Violence: Not on file  ?: ? ?Pertinent items are noted in HPI. ? ?Exam: ?Blood pressure 126/78, pulse 84, temperature (!) 97.3 ?F (36.3 ?C), temperature source Axillary, resp. rate 14, height 5\' 7"  (1.702 m), weight 230 lb (104.3 kg), SpO2 96 %. ?ECOG 0 ?General appearance: alert and cooperative appeared without distress. ?Head: atraumatic without any abnormalities. ?Eyes: conjunctivae/corneas clear. PERRL.  Sclera anicteric. ?Throat: lips, mucosa, and tongue normal; without oral thrush or ulcers.  No hemorrhage or blood blisters  noted. ?Resp: clear to auscultation bilaterally without rhonchi, wheezes or dullness to percussion. ?Cardio: regular rate and rhythm, S1, S2 normal, no murmur, click, rub or gallop ?GI: soft, non-tender; bowel sounds normal; no masses,  no organomegaly ?Skin: Petechiae noted on his lower extremity.  Ecchymosis noted as well. ?Lymph nodes: Cervical, supraclavicular, and axillary nodes normal. ?Neurologic: Grossly normal without any motor, sensory or deep tendon reflexes. ?Musculoskeletal: No joint deformity or effusion. ? ? ?Recent Labs  ?  04/01/22 ?1825 04/02/22 ?0306  ?WBC 2.2* 2.5*  ?HGB 13.7 12.9*  ?HCT 38.7* 37.2*  ?PLT 8* 16*  ? ? ?Recent Labs  ?  04/01/22 ?1825 04/02/22 ?0306  ?NA 140 140  ?K 3.9 3.7  ?CL  107 107  ?CO2 26 26  ?GLUCOSE 138* 112*  ?BUN 21* 19  ?CREATININE 1.14 0.84  ?CALCIUM 8.9 8.5*  ? ? ? ?Blood smear review: Personally reviewed and shows severe thrombocytopenia and very little neutrophils noted.  He does have enlarged atypical lymphocytes which are concerning for immature cell lines. ? ?CT HEAD WO CONTRAST (5MM) ? ?Result Date: 04/01/2022 ?CLINICAL DATA:  Headache, sudden, severe. Severe thrombocytopenia with oral petechiae with concern for possible intracranial hemorrhage EXAM: CT HEAD WITHOUT CONTRAST TECHNIQUE: Contiguous axial images were obtained from the base of the skull through the vertex without intravenous contrast. RADIATION DOSE REDUCTION: This exam was performed according to the departmental dose-optimization program which includes automated exposure control, adjustment of the mA and/or kV according to patient size and/or use of iterative reconstruction technique. COMPARISON:  None. FINDINGS: Brain: Normal anatomic configuration. No abnormal intra or extra-axial mass lesion or fluid collection. No abnormal mass effect or midline shift. No evidence of acute intracranial hemorrhage or infarct. Ventricular size is normal. Cerebellum unremarkable. Vascular: Unremarkable Skull: Intact  Sinuses/Orbits: Paranasal sinuses are clear. Orbits are unremarkable. Other: Mastoid air cells and middle ear cavities are clear. IMPRESSION: Normal examination.  No acute intracranial hemorrhage. Electronically Signed

## 2022-04-02 NOTE — Assessment & Plan Note (Addendum)
Bilirubin of 1.4 on admission, up to 1.5; predominantly indirect. Normal AST/ALT. No associated anemia at this time. Resolved. ?

## 2022-04-02 NOTE — Progress Notes (Signed)
? ?PROGRESS NOTE ? ? ? ?Mark Duarte  UYQ:[id number] DOB: [birth year] DOA: 04/01/2022 ?PCP: Patient, No Pcp Per (Inactive) ? ? ?Brief Narrative: ?Mark Duarte is a 41 y.o. male with no significant history. Patient presented secondary to evidence of thrombocytopenia and neutropenia. No associated bleeding. Hematology/oncology consulted. Empiric antibiotics initiated. 1 unit of platelets transfused to date. ? ? ?Assessment and Plan: ? ?* Severe thrombocytopenia (HCC) ?Platelet count of 8,000 on admission with petechiae noted. CT head obtained and was without intracranial bleeding. Hematology/oncology consulted. 1 unit of platelets transfused with rebound platelet count to 16,000. ?-Daily CBC ?-Hematology/oncology recommendations: observe. Consider bone marrow biopsy if no improvement, transfuse for platelets < 10,000 or bleeding ? ?Abnormal chest CT ?Chest CT with atelectasis, airway inflammation and focal nodular infiltrate in RUL. Possible evidence of prior atypical infection. Patient started empirically started on Vancomycin, Cefepime and Flagyl. Blood cultures obtained on admission. RVP negative.  ? ?Hyperbilirubinemia ?Bilirubin of 1.4, up to 1.5 today. Normal AST/ALT ?-Add fractionated blirubin ? ?Hilar adenopathy ?Noted on CT chest. Will need repeat imaging in 3 months. ? ?Petechial rash ?Likely secondary to thrombocytopenia. ? ?Obesity (BMI 30-39.9) ?Body mass index is 36.02 kg/m?. ? ?Neutropenia ([place]) ?ANC of 100. Possibly related to viral illness. Afebrile. Empiric Vancomycin/Cefepime/Flagyl initiated. Blood cultures obtained. ?-Neutropenic precautions ?-Follow-up blood cultures; discontinued antibiotics if cultures are negative ? ? ? ?DVT prophylaxis: SCDs ?Code Status:   Code Status: Full Code ?Family Communication: Wife and father at bedside ?Disposition Plan: Discharge home likely in several days pending stable platelets/neutrophils and hematology/oncology recommendations ? ? ?Consultants:   ?Hematology/Oncology ? ?Procedures:  ?None ? ?Antimicrobials: ?Vancomycin ?Zosyn ?Flagyl  ? ? ?Subjective: ?No issues this morning. ? ?Objective: ?BP 133/70 (BP Location: Right Arm)   Pulse 90   Temp (!) 97.3 ?F (36.3 ?C) (Oral)   Resp 14   Ht _0  (1.702 m)   Wt 104.3 kg   SpO2 97%   BMI 36.02 kg/m?  ? ?Examination: ? ?General exam: Appears calm and comfortable ?Respiratory system: Mild bibasilar rales. Respiratory effort normal. ?Cardiovascular system: S1 & S2 heard, RRR. No murmurs, rubs, gallops or clicks. ?Gastrointestinal system: Abdomen is nondistended, soft and nontender. Normal bowel sounds heard. ?Central nervous system: Alert and oriented. No focal neurological deficits. ?Musculoskeletal: No edema. No calf tenderness ?Skin: No cyanosis. Petechial rash of bilateral legs ?Psychiatry: Judgement and insight appear normal. Mood & affect appropriate.  ? ? ?Data Reviewed: I have personally reviewed following labs and imaging studies ? ?CBC ?Lab Results  ?Component Value Date  ? WBC 2.5 (L) 04/02/2022  ? RBC 4.21 (L) 04/02/2022  ? HGB 12.9 (L) 04/02/2022  ? HCT 37.2 (L) 04/02/2022  ? MCV 88.4 04/02/2022  ? MCH 30.6 04/02/2022  ? PLT 16 (LL) 04/02/2022  ? MCHC 34.7 04/02/2022  ? RDW 12.3 04/02/2022  ? LYMPHSABS 1.7 04/01/2022  ? MONOABS 0.4 04/01/2022  ? EOSABS 0.1 04/01/2022  ? BASOSABS 0.0 04/01/2022  ? ? ? ?Last metabolic panel ?Lab Results  ?Component Value Date  ? NA 140 04/02/2022  ? K 3.7 04/02/2022  ? CL 107 04/02/2022  ? CO2 26 04/02/2022  ? BUN 19 04/02/2022  ? CREATININE 0.84 04/02/2022  ? GLUCOSE 112 (H) 04/02/2022  ? GFRNONAA >60 04/02/2022  ? GFRAA >60 09/14/2018  ? CALCIUM 8.5 (L) 04/02/2022  ? PHOS 3.6 04/02/2022  ? PROT 6.4 (L) 04/02/2022  ? ALBUMIN 3.6 04/02/2022  ? BILITOT 1.5 (H) 04/02/2022  ? ALKPHOS 47 04/02/2022  ?  AST 15 04/02/2022  ? ALT 22 04/02/2022  ? ANIONGAP 7 04/02/2022  ? ? ?GFR: ?Estimated Creatinine Clearance: 133.2 mL/min (by C-G formula based on SCr of 0.84  mg/dL). ? ?Recent Results (from the past 240 hour(s))  ?Blood culture (routine x 2)     Status: None (Preliminary result)  ? Collection Time: 04/01/22  9:00 PM  ? Specimen: BLOOD  ?Result Value Ref Range Status  ? Specimen Description   Final  ?  BLOOD RIGHT ANTECUBITAL ?Performed at [facility], [place] [address] [place], [place] [address] ?  ? Special Requests   Final  ?  BOTTLES DRAWN AEROBIC AND ANAEROBIC Blood Culture adequate volume ?Performed at [facility], [place] [address], [place], [place] [address] ?  ? Culture   Final  ?  NO GROWTH < 12 HOURS ?Performed at [facility], [place] [address], [place], [place] [address] ?  ? Report Status PENDING  Incomplete  ?Blood culture (routine x 2)     Status: None (Preliminary result)  ? Collection Time: 04/01/22  9:00 PM  ? Specimen: BLOOD  ?Result Value Ref Range Status  ? Specimen Description   Final  ?  BLOOD LEFT ANTECUBITAL ?Performed at [facility] [place] [address] [place], [place] [address] ?  ? Special Requests   Final  ?  BOTTLES DRAWN AEROBIC AND ANAEROBIC Blood Culture adequate volume ?Performed at [facility], [place] [address], [place], [place] [address] ?  ? Culture   Final  ?  NO GROWTH < 12 HOURS ?Performed at [facility], [place] [address], [place], [place] [address] ?  ? Report Status PENDING  Incomplete  ?Respiratory (~20 pathogens) panel by PCR     Status: None  ? Collection Time: 04/02/22  4:04 AM  ? Specimen: Nasopharyngeal Swab; Respiratory  ?Result Value Ref Range Status  ? Adenovirus NOT DETECTED NOT DETECTED Final  ? Coronavirus 229E NOT DETECTED NOT DETECTED Final  ?  Comment: (NOTE) ?The Coronavirus on the Respiratory Panel, DOES NOT test for the novel  ?Coronavirus (2019 nCoV) ?  ? Coronavirus HKU1 NOT DETECTED NOT DETECTED Final  ? Coronavirus NL63 NOT DETECTED NOT DETECTED Final  ? Coronavirus OC43 NOT DETECTED NOT DETECTED Final  ? Metapneumovirus NOT  DETECTED NOT DETECTED Final  ? Rhinovirus / Enterovirus NOT DETECTED NOT DETECTED Final  ? Influenza A NOT DETECTED NOT DETECTED Final  ? Influenza B NOT DETECTED NOT DETECTED Final  ? Parainfluenza Virus 1 NOT DETECTED NOT DETECTED Final  ? Parainfluenza Virus 2 NOT DETECTED NOT DETECTED Final  ? Parainfluenza Virus 3 NOT DETECTED NOT DETECTED Final  ? Parainfluenza Virus 4 NOT DETECTED NOT DETECTED Final  ? Respiratory Syncytial Virus NOT DETECTED NOT DETECTED Final  ? Bordetella pertussis NOT DETECTED NOT DETECTED Final  ? Bordetella Parapertussis NOT DETECTED NOT DETECTED Final  ? Chlamydophila pneumoniae NOT DETECTED NOT DETECTED Final  ? Mycoplasma pneumoniae NOT DETECTED NOT DETECTED Final  ?  Comment: Performed at [facility], [place] [address], [place], [place] [address]  ?  ? ? ?Radiology Studies: ?CT HEAD WO CONTRAST (5MM) ? ?Result Date: 04/01/2022 ?CLINICAL DATA:  Headache, sudden, severe. Severe thrombocytopenia with oral petechiae with concern for possible intracranial hemorrhage EXAM: CT HEAD WITHOUT CONTRAST TECHNIQUE: Contiguous axial images were obtained from the base of the skull through the vertex without intravenous contrast. RADIATION DOSE REDUCTION: This exam was performed according to the departmental  dose-optimization program which includes automated exposure control, adjustment of the mA and/or kV according to patient size and/or use of iterative reconstruction technique. COMPARISON:  None. FINDINGS: Brain: Normal anatomic configuration. No abnormal intra or extra-axial mass lesion or fluid collection. No abnormal mass effect or midline shift. No evidence of acute intracranial hemorrhage or infarct. Ventricular size is normal. Cerebellum unremarkable. Vascular: Unremarkable Skull: Intact Sinuses/Orbits: Paranasal sinuses are clear. Orbits are unremarkable. Other: Mastoid air cells and middle ear cavities are clear. IMPRESSION: Normal examination.  No acute intracranial hemorrhage.  Electronically Signed   By: Fidela Salisbury M.D.   On: 04/01/2022 22:53  ? ?CT Angio Chest/Abd/Pel for Dissection W and/or W/WO ? ?Result Date: 04/01/2022 ?CLINICAL DATA:  Severe thrombocytopenia with spontaneous bruising and petechiae. Also

## 2022-04-02 NOTE — Assessment & Plan Note (Addendum)
Chest CT with atelectasis, airway inflammation and focal nodular infiltrate in RUL. Possible evidence of prior atypical infection. Patient started empirically started on Vancomycin, Cefepime and Flagyl. Blood cultures obtained on admission. Respiratory virus panel negative. Antibiotics discontinued prior to discharge. ?

## 2022-04-02 NOTE — Assessment & Plan Note (Signed)
Noted on CT chest. Will need repeat imaging in 3 months. ?

## 2022-04-02 NOTE — Assessment & Plan Note (Addendum)
ANC of 100 with increase to 300. Possibly related to viral illness. Afebrile. Empiric Vancomycin/Cefepime/Flagyl initiated. Blood cultures obtained and significant for possible contamination. Neutropenic precautions on discharge. ?

## 2022-04-02 NOTE — Progress Notes (Signed)
PHARMACY - PHYSICIAN COMMUNICATION ?CRITICAL VALUE ALERT - BLOOD CULTURE IDENTIFICATION (BCID) ? ?Mark Duarte is an 41 y.o. male who presented to [facility] on 04/01/2022 with a chief complaint of severe thrombocytopenia.  ? ?Assessment:  1/4 bottles (anaerobic) with GPC, BCID detects staph epi with mecA resistance ? ?Name of physician (or Provider) ContactedLorra Hals, NP ? ?Current antibiotics: vancomycin, cefepime, flagyl ? ?Changes to prescribed antibiotics recommended:  ?Oncology working up neutropenia as acute bone marrow process.  Suspect organism is a contaminant. Recommend to repeat blood cultures and monitor off antibiotics for now.  ? ?Results for orders placed or performed during the hospital encounter of 04/01/22  ?Blood Culture ID Panel (Reflexed) (Collected: 04/01/2022  9:00 PM)  ?Result Value Ref Range  ? Enterococcus faecalis NOT DETECTED NOT DETECTED  ? Enterococcus Faecium NOT DETECTED NOT DETECTED  ? Listeria monocytogenes NOT DETECTED NOT DETECTED  ? Staphylococcus species DETECTED (A) NOT DETECTED  ? Staphylococcus aureus (BCID) NOT DETECTED NOT DETECTED  ? Staphylococcus epidermidis DETECTED (A) NOT DETECTED  ? Staphylococcus lugdunensis NOT DETECTED NOT DETECTED  ? Streptococcus species NOT DETECTED NOT DETECTED  ? Streptococcus agalactiae NOT DETECTED NOT DETECTED  ? Streptococcus pneumoniae NOT DETECTED NOT DETECTED  ? Streptococcus pyogenes NOT DETECTED NOT DETECTED  ? A.calcoaceticus-baumannii NOT DETECTED NOT DETECTED  ? Bacteroides fragilis NOT DETECTED NOT DETECTED  ? Enterobacterales NOT DETECTED NOT DETECTED  ? Enterobacter cloacae complex NOT DETECTED NOT DETECTED  ? Escherichia coli NOT DETECTED NOT DETECTED  ? Klebsiella aerogenes NOT DETECTED NOT DETECTED  ? Klebsiella oxytoca NOT DETECTED NOT DETECTED  ? Klebsiella pneumoniae NOT DETECTED NOT DETECTED  ? Proteus species NOT DETECTED NOT DETECTED  ? Salmonella species NOT DETECTED NOT DETECTED  ? Serratia marcescens NOT  DETECTED NOT DETECTED  ? Haemophilus influenzae NOT DETECTED NOT DETECTED  ? Neisseria meningitidis NOT DETECTED NOT DETECTED  ? Pseudomonas aeruginosa NOT DETECTED NOT DETECTED  ? Stenotrophomonas maltophilia NOT DETECTED NOT DETECTED  ? Candida albicans NOT DETECTED NOT DETECTED  ? Candida auris NOT DETECTED NOT DETECTED  ? Candida glabrata NOT DETECTED NOT DETECTED  ? Candida krusei NOT DETECTED NOT DETECTED  ? Candida parapsilosis NOT DETECTED NOT DETECTED  ? Candida tropicalis NOT DETECTED NOT DETECTED  ? Cryptococcus neoformans/gattii NOT DETECTED NOT DETECTED  ? Methicillin resistance mecA/C DETECTED (A) NOT DETECTED  ? ?Dimple Nanas, PharmD ?04/02/2022 9:36 PM ? ?

## 2022-04-03 DIAGNOSIS — R7881 Bacteremia: Secondary | ICD-10-CM

## 2022-04-03 DIAGNOSIS — R9389 Abnormal findings on diagnostic imaging of other specified body structures: Secondary | ICD-10-CM | POA: Diagnosis not present

## 2022-04-03 DIAGNOSIS — D696 Thrombocytopenia, unspecified: Secondary | ICD-10-CM | POA: Diagnosis not present

## 2022-04-03 DIAGNOSIS — R590 Localized enlarged lymph nodes: Secondary | ICD-10-CM | POA: Diagnosis not present

## 2022-04-03 LAB — PREPARE PLATELET PHERESIS: Unit division: 0

## 2022-04-03 LAB — BPAM PLATELET PHERESIS
Blood Product Expiration Date: 202304162359
ISSUE DATE / TIME: 202304150135
Unit Type and Rh: 5100

## 2022-04-03 LAB — CBC WITH DIFFERENTIAL/PLATELET
Abs Immature Granulocytes: 0.06 10*3/uL (ref 0.00–0.07)
Basophils Absolute: 0 10*3/uL (ref 0.0–0.1)
Basophils Relative: 1 %
Eosinophils Absolute: 0.1 10*3/uL (ref 0.0–0.5)
Eosinophils Relative: 2 %
HCT: 38.7 % — ABNORMAL LOW (ref 39.0–52.0)
Hemoglobin: 13.2 g/dL (ref 13.0–17.0)
Immature Granulocytes: 3 %
Lymphocytes Relative: 64 %
Lymphs Abs: 1.4 10*3/uL (ref 0.7–4.0)
MCH: 30.6 pg (ref 26.0–34.0)
MCHC: 34.1 g/dL (ref 30.0–36.0)
MCV: 89.8 fL (ref 80.0–100.0)
Monocytes Absolute: 0.4 10*3/uL (ref 0.1–1.0)
Monocytes Relative: 18 %
Neutro Abs: 0.3 10*3/uL — CL (ref 1.7–7.7)
Neutrophils Relative %: 12 %
Platelets: 17 10*3/uL — CL (ref 150–400)
RBC: 4.31 MIL/uL (ref 4.22–5.81)
RDW: 12.2 % (ref 11.5–15.5)
WBC: 2.2 10*3/uL — ABNORMAL LOW (ref 4.0–10.5)
nRBC: 0.9 % — ABNORMAL HIGH (ref 0.0–0.2)

## 2022-04-03 LAB — HIV ANTIBODY (ROUTINE TESTING W REFLEX): HIV Screen 4th Generation wRfx: NONREACTIVE

## 2022-04-03 LAB — URINE CULTURE: Culture: NO GROWTH

## 2022-04-03 NOTE — Progress Notes (Addendum)
? ?PROGRESS NOTE ? ? ? ?Mark Duarte  QJJ:[id number] DOB: 1981-09-02 DOA: 04/01/2022 ?PCP: Patient, No Pcp Per (Inactive) ? ? ?Brief Narrative: ?Mark Duarte is a 41 y.o. male with no significant history. Patient presented secondary to evidence of thrombocytopenia and neutropenia. No associated bleeding. Hematology/oncology consulted. Empiric antibiotics initiated. 1 unit of platelets transfused to date. Platelets stabilized. ? ? ?Assessment and Plan: ? ?* Severe thrombocytopenia (HCC) ?Platelet count of 8,000 on admission with petechiae noted. CT head obtained and was without intracranial bleeding. Hematology/oncology consulted. 1 unit of platelets transfused with rebound platelet count to 16,000. Platelets up to 17,000 this morning on CBC ?-Daily CBC ?-Hematology/oncology recommendations (4/16): Observe. Consider transfer to [facility] for further management/bone marrow biopsy if no improvement. Transfuse for platelets < 10,000 or bleeding. ? ?Abnormal chest CT ?Chest CT with atelectasis, airway inflammation and focal nodular infiltrate in RUL. Possible evidence of prior atypical infection. Patient started empirically started on Vancomycin, Cefepime and Flagyl. Blood cultures obtained on admission. RVP negative.  ? ?Hyperbilirubinemia ?Bilirubin of 1.4 on admission, up to 1.5; predominantly indirect. Normal AST/ALT. No associated anemia at this time. ?-CMP in AM ? ?Hilar adenopathy ?Noted on CT chest. Will need repeat imaging in 3 months. ? ?Petechial rash ?Likely secondary to thrombocytopenia. Improved today. ? ?Obesity (BMI 30-39.9) ?Body mass index is 36.02 kg/m?. ? ?Positive blood culture ?GPCs on culture with staphylococcus epidermidis on BCID. Antibiotics discontinued secondary to concern for contamination. Repeat blood cultures obtained on 4/16. ?-Follow-up blood cultures ? ?Neutropenia ([place]) ?ANC of 100 with increase to 300 on CBC today. Possibly related to viral illness. Afebrile. Empiric  Vancomycin/Cefepime/Flagyl initiated. Blood cultures obtained and significant for possible contamination. ?-Neutropenic precautions ? ? ? ?DVT prophylaxis: SCDs ?Code Status:   Code Status: Full Code ?Family Communication: Wife and father at bedside ?Disposition Plan: Discharge home likely in several days pending stable platelets/neutrophils and hematology/oncology recommendations ? ? ?Consultants:  ?Hematology/Oncology ? ?Procedures:  ?None ? ?Antimicrobials: ?Vancomycin ?Zosyn ?Flagyl  ? ? ?Subjective: ?No issues this morning. ? ?Objective: ?BP 129/64 (BP Location: Right Arm)   Pulse 88   Temp 98.5 ?F (36.9 ?C) (Oral)   Resp 18   Ht _0  (1.702 m)   Wt 104.3 kg   SpO2 98%   BMI 36.02 kg/m?  ? ?Examination: ? ?General exam: Appears calm and comfortable ?Respiratory system: Clear to auscultation. Respiratory effort normal. ?Cardiovascular system: S1 & S2 heard, RRR. No murmurs. ?Gastrointestinal system: Abdomen is nondistended, soft and nontender. Normal bowel sounds heard. ?Central nervous system: Alert and oriented. No focal neurological deficits. ?Musculoskeletal: No edema. No calf tenderness ?Skin: No cyanosis. Faint petechial rash ?Psychiatry: Judgement and insight appear normal. Mood & affect appropriate.  ? ? ?Data Reviewed: I have personally reviewed following labs and imaging studies ? ?CBC ?Lab Results  ?Component Value Date  ? WBC 2.2 (L) 04/03/2022  ? RBC 4.31 04/03/2022  ? HGB 13.2 04/03/2022  ? HCT 38.7 (L) 04/03/2022  ? MCV 89.8 04/03/2022  ? MCH 30.6 04/03/2022  ? PLT 17 (LL) 04/03/2022  ? MCHC 34.1 04/03/2022  ? RDW 12.2 04/03/2022  ? LYMPHSABS 1.4 04/03/2022  ? MONOABS 0.4 04/03/2022  ? EOSABS 0.1 04/03/2022  ? BASOSABS 0.0 04/03/2022  ? ? ? ?Last metabolic panel ?Lab Results  ?Component Value Date  ? NA 140 04/02/2022  ? K 3.7 04/02/2022  ? CL 107 04/02/2022  ? CO2 26 04/02/2022  ? BUN 19 04/02/2022  ? CREATININE 0.84 04/02/2022  ?  GLUCOSE 112 (H) 04/02/2022  ? GFRNONAA >60 04/02/2022  ?  GFRAA >60 09/14/2018  ? CALCIUM 8.5 (L) 04/02/2022  ? PHOS 3.6 04/02/2022  ? PROT 6.4 (L) 04/02/2022  ? ALBUMIN 3.6 04/02/2022  ? BILITOT 1.5 (H) 04/02/2022  ? BILITOT 1.4 (H) 04/02/2022  ? ALKPHOS 47 04/02/2022  ? AST 15 04/02/2022  ? ALT 22 04/02/2022  ? ANIONGAP 7 04/02/2022  ? ? ?GFR: ?Estimated Creatinine Clearance: 133.2 mL/min (by C-G formula based on SCr of 0.84 mg/dL). ? ?Recent Results (from the past 240 hour(s))  ?Blood culture (routine x 2)     Status: None (Preliminary result)  ? Collection Time: 04/01/22  9:00 PM  ? Specimen: BLOOD  ?Result Value Ref Range Status  ? Specimen Description   Final  ?  BLOOD RIGHT ANTECUBITAL ?Performed at [facility], [place] [address], [place], [place] [address] ?  ? Special Requests   Final  ?  BOTTLES DRAWN AEROBIC AND ANAEROBIC Blood Culture adequate volume ?Performed at [facility], [place] [address] [place], [place] [address] ?  ? Culture   Final  ?  NO GROWTH 2 DAYS ?Performed at [facility], [place] [address], [place], [place] [address] ?  ? Report Status PENDING  Incomplete  ?Blood culture (routine x 2)     Status: None (Preliminary result)  ? Collection Time: 04/01/22  9:00 PM  ? Specimen: BLOOD  ?Result Value Ref Range Status  ? Specimen Description   Final  ?  BLOOD LEFT ANTECUBITAL ?Performed at [facility], [place] [address] [place], [place] [address] ?  ? Special Requests   Final  ?  BOTTLES DRAWN AEROBIC AND ANAEROBIC Blood Culture adequate volume ?Performed at [facility], [place] [address], [place], [place] [address] ?  ? Culture  Setup Time   Final  ?  GRAM POSITIVE COCCI IN CLUSTERS ?ANAEROBIC BOTTLE ONLY ?CRITICAL RESULT CALLED TO, READ BACK BY AND VERIFIED WITH: M TUCKER,[email or web address]  04/02/22 [place] ?  ? Culture   Final  ?  GRAM POSITIVE COCCI ?IDENTIFICATION TO FOLLOW ?Performed at [facility], [place] [address], [place],  [address] ?  ? Report Status PENDING  Incomplete   ?Blood Culture ID Panel (Reflexed)     Status: Abnormal  ? Collection Time: 04/01/22  9:00 PM  ?Result Value Ref Range Status  ? Enterococcus faecalis NOT DETECTED NOT DETECTED Final  ? Enterococcus Faecium NOT DETECTED NOT DETECTED Final  ? Listeria monocytogenes NOT DETECTED NOT DETECTED Final  ? Staphylococcus species DETECTED (A) NOT DETECTED Final  ?  Comment: CRITICAL RESULT CALLED TO, READ BACK BY AND VERIFIED WITH: ?M TUCKER,[email or web address]  04/02/22 [place] ?  ? Staphylococcus aureus (BCID) NOT DETECTED NOT DETECTED Final  ? Staphylococcus epidermidis DETECTED (A) NOT DETECTED Final  ?  Comment: Methicillin (oxacillin) resistant coagulase negative staphylococcus. Possible blood culture contaminant (unless isolated from more than one blood culture draw or clinical case suggests pathogenicity). No antibiotic treatment is indicated for blood  ?culture contaminants. ?CRITICAL RESULT CALLED TO, READ BACK BY AND VERIFIED WITH: ?M TUCKER,[email or web address]_[email or web address]  04/02/22 [place] ?  ? Staphylococcus lugdunensis NOT DETECTED NOT DETECTED Final  ? Streptococcus species NOT DETECTED NOT DETECTED Final  ? Streptococcus agalactiae NOT DETECTED NOT DETECTED Final  ? Streptococcus pneumoniae NOT DETECTED NOT DETECTED Final  ? Streptococcus pyogenes NOT DETECTED NOT DETECTED Final  ? A.calcoaceticus-baumannii NOT DETECTED NOT DETECTED Final  ? Bacteroides fragilis NOT DETECTED NOT DETECTED Final  ?  Enterobacterales NOT DETECTED NOT DETECTED Final  ? Enterobacter cloacae complex NOT DETECTED NOT DETECTED Final  ? Escherichia coli NOT DETECTED NOT DETECTED Final  ? Klebsiella aerogenes NOT DETECTED NOT DETECTED Final  ? Klebsiella oxytoca NOT DETECTED NOT DETECTED Final  ? Klebsiella pneumoniae NOT DETECTED NOT DETECTED Final  ? Proteus species NOT DETECTED NOT DETECTED Final  ? Salmonella species NOT DETECTED NOT DETECTED Final  ? Serratia marcescens NOT DETECTED NOT DETECTED Final  ? Haemophilus influenzae NOT DETECTED NOT DETECTED Final  ?  Neisseria meningitidis NOT DETECTED NOT DETECTED Final  ? Pseudomonas aeruginosa NOT DETECTED NOT DETECTED Final  ? Stenotrophomonas maltophilia NOT DETECTED NOT DETECTED Final  ? Candida albicans NOT DETECTED NOT DETECTED Final

## 2022-04-03 NOTE — Assessment & Plan Note (Addendum)
GPCs on culture with staphylococcus epidermidis on BCID. Antibiotics discontinued secondary to concern for contamination. Repeat blood cultures obtained on 4/16 and are no growth to date prior to discharge. Likely contaminant. Will need to follow-up final results. ?

## 2022-04-03 NOTE — Progress Notes (Signed)
Date and time results received: 04/03/22  at 1026 ?(use smartphrase ".now" to insert current time) ? ?Test: NEUT  ?Critical Value: 0.3 ? ?Name of Provider Notified: Mariel Aloe MD ? ?Orders Received? Or Actions Taken?:  ?Protective precaution isolation ?

## 2022-04-03 NOTE — Progress Notes (Signed)
IP PROGRESS NOTE ? ?Subjective:  ? ?Mark Duarte is feeling better today.  He still has periodic nonproductive cough.  He denies any nausea, or abdominal pain.  He denies any bleeding or petechiae. ? ?Objective: ? ?Vital signs in last 24 hours: ?Temp:  [97.5 ?F (36.4 ?C)-98.5 ?F (36.9 ?C)] 98.5 ?F (36.9 ?C) (04/16 0551) ?Pulse Rate:  [76-90] 88 (04/16 0551) ?Resp:  [14-18] 18 (04/16 0551) ?BP: (112-133)/(64-78) 129/64 (04/16 0551) ?SpO2:  [96 %-98 %] 98 % (04/16 0551) ?Weight change:  ?Last BM Date : 04/02/22 ? ?Intake/Output from previous day: ?04/15 0701 - 04/16 0700 ?In: 2194.9 [P.O.:1480; I.V.:393.4; IV Piggyback:321.5] ?Out: 0  ?General: Alert, awake without distress. ?Head: Normocephalic atraumatic. ?Mouth: mucous membranes moist, pharynx normal without lesions ?Eyes: No scleral icterus.  Pupils are equal and round reactive to light. ?Resp: clear to auscultation bilaterally without rhonchi or wheezes or dullness to percussion. ?Cardio: regular rate and rhythm, S1, S2 normal, no murmur, click, rub or gallop ?GI: soft, non-tender; bowel sounds normal; no masses,  no organomegaly ?Musculoskeletal: No joint deformity or effusion. ?Neurological: No motor, sensory deficits.  Intact deep tendon reflexes. ?Skin: Faint healed petechiae and ecchymosis noted. ? ? ?Lab Results: ?Recent Labs  ?  04/02/22 ?0306 04/03/22 ?0424  ?WBC 2.5* 2.2*  ?HGB 12.9* 13.2  ?HCT 37.2* 38.7*  ?PLT 16* 17*  ? ? ?BMET ?Recent Labs  ?  04/01/22 ?1825 04/02/22 ?0306  ?NA 140 140  ?K 3.9 3.7  ?CL 107 107  ?CO2 26 26  ?GLUCOSE 138* 112*  ?BUN 21* 19  ?CREATININE 1.14 0.84  ?CALCIUM 8.9 8.5*  ? ? ? ?Medications: I have reviewed the patient's current medications. ? ?Assessment/Plan: ? ?41 year old with: ? ?1.  Severe thrombocytopenia associated with acute neutropenia and lymphocytosis.  Laboratory data from today reviewed and showed his neutrophil percentage is improving with decrease in his lymphocytosis. ? ?His peripheral smear was personally  reviewed again today and for the most part appears to be benign appearing.  There are very few immature appearing lymphocytes that could be reactive but lymphoblasts cannot be completely ruled out at this time. ? ?I had a long discussion today with the patient and his wife about the management options moving forward.  Given the slight improvement in his counts today it is reasonable to wait another 24 hours and repeat his labs to see if the recovery pattern continues.  We will also obtain an official hematopathology peripheral smear review to evaluate for possible immature cells. ? ?If his counts continues to improve, and a possible discharge can be attempted tomorrow and follow-up as an outpatient.  If his counts show more concerning findings for an acute leukemic process, I would be in favor for a transfer to [facility] at that time for complete evaluation including bone marrow biopsy at that time and possibly treatment for acute leukemia. ? ?All their questions were answered to their satisfaction and they understand the plan at this time. ? ?From a management standpoint, I recommend continued supportive measures without any additional transfusion at this time. ? ?2.  Disposition: I recommend keeping him inpatient for the time being given his critical counts and the possibility of a severe hematological disorder.  We will continue to follow. ? ?45  minutes were dedicated to this visit.  More than 50% of the time was spent face-to-face with the patient and his wife.  The time was dedicated to reviewing laboratory data, reviewing the differential diagnosis, treatment choices and next steps  to make diagnosis and treatment.  There is also significant time spent on reviewing the peripheral smear and discussing with laboratory staff. ? ? ? LOS: 2 days  ? ?Zola Button 04/03/2022, 10:22 AM ? ?

## 2022-04-04 ENCOUNTER — Other Ambulatory Visit: Payer: Self-pay | Admitting: Oncology

## 2022-04-04 ENCOUNTER — Telehealth: Payer: Self-pay | Admitting: Oncology

## 2022-04-04 DIAGNOSIS — D696 Thrombocytopenia, unspecified: Secondary | ICD-10-CM

## 2022-04-04 DIAGNOSIS — R7881 Bacteremia: Secondary | ICD-10-CM

## 2022-04-04 DIAGNOSIS — R233 Spontaneous ecchymoses: Secondary | ICD-10-CM

## 2022-04-04 DIAGNOSIS — R9389 Abnormal findings on diagnostic imaging of other specified body structures: Secondary | ICD-10-CM | POA: Diagnosis not present

## 2022-04-04 DIAGNOSIS — E669 Obesity, unspecified: Secondary | ICD-10-CM

## 2022-04-04 DIAGNOSIS — R590 Localized enlarged lymph nodes: Secondary | ICD-10-CM | POA: Diagnosis not present

## 2022-04-04 LAB — LACTATE DEHYDROGENASE: LDH: 178 U/L (ref 98–192)

## 2022-04-04 LAB — COMPREHENSIVE METABOLIC PANEL
ALT: 23 U/L (ref 0–44)
AST: 14 U/L — ABNORMAL LOW (ref 15–41)
Albumin: 3.5 g/dL (ref 3.5–5.0)
Alkaline Phosphatase: 45 U/L (ref 38–126)
Anion gap: 7 (ref 5–15)
BUN: 17 mg/dL (ref 6–20)
CO2: 26 mmol/L (ref 22–32)
Calcium: 8.6 mg/dL — ABNORMAL LOW (ref 8.9–10.3)
Chloride: 110 mmol/L (ref 98–111)
Creatinine, Ser: 0.65 mg/dL (ref 0.61–1.24)
GFR, Estimated: >60 mL/min (ref >60)
Glucose, Bld: 131 mg/dL — ABNORMAL HIGH (ref 70–99)
Potassium: 3.9 mmol/L (ref 3.5–5.1)
Sodium: 143 mmol/L (ref 135–145)
Total Bilirubin: 0.4 mg/dL (ref 0.3–1.2)
Total Protein: 6.3 g/dL — ABNORMAL LOW (ref 6.5–8.1)

## 2022-04-04 LAB — CBC WITH DIFFERENTIAL/PLATELET
Abs Immature Granulocytes: 0.05 10*3/uL (ref 0.00–0.07)
Basophils Absolute: 0 10*3/uL (ref 0.0–0.1)
Basophils Relative: 1 %
Eosinophils Absolute: 0.1 10*3/uL (ref 0.0–0.5)
Eosinophils Relative: 2 %
HCT: 37.7 % — ABNORMAL LOW (ref 39.0–52.0)
Hemoglobin: 13 g/dL (ref 13.0–17.0)
Immature Granulocytes: 2 %
Lymphocytes Relative: 68 %
Lymphs Abs: 1.8 10*3/uL (ref 0.7–4.0)
MCH: 30.9 pg (ref 26.0–34.0)
MCHC: 34.5 g/dL (ref 30.0–36.0)
MCV: 89.5 fL (ref 80.0–100.0)
Monocytes Absolute: 0.5 10*3/uL (ref 0.1–1.0)
Monocytes Relative: 17 %
Neutro Abs: 0.3 10*3/uL — CL (ref 1.7–7.7)
Neutrophils Relative %: 10 %
Platelets: 28 10*3/uL — CL (ref 150–400)
RBC: 4.21 MIL/uL — ABNORMAL LOW (ref 4.22–5.81)
RDW: 12.4 % (ref 11.5–15.5)
WBC: 2.6 10*3/uL — ABNORMAL LOW (ref 4.0–10.5)
nRBC: 0 % (ref 0.0–0.2)

## 2022-04-04 LAB — CULTURE, BLOOD (ROUTINE X 2): Special Requests: ADEQUATE

## 2022-04-04 NOTE — Discharge Instructions (Addendum)
Mark Duarte, ? ?You were in the hospital for low platelets. You received a transfusion of platelets with improving numbers. Your neutrophils are a type of white blood cell that helps fight against bacterial infections. Please return or call your PCP if you notice fevers at home (temperature > 100.4 ?F. ? ?Incidentally, you were found to have some enlarged lymph nodes in your chest. This will need to be reevaluated with a repeat CT chest scan in about 3 months. Please speak to your PCP about this. ?

## 2022-04-04 NOTE — Discharge Summary (Signed)
?Physician Discharge Summary ?  ?Patient: Mark Duarte MRN: [id number] DOB: 1981-05-22  ?Admit date:     04/01/2022  ?Discharge date: 04/04/22  ?Discharge Physician: Jacquelin Hawkingalph Tyriana Helmkamp, MD  ? ?PCP: Patient, No Pcp Per (Inactive)  ? ?Recommendations at discharge:  ? ?Outpatient follow-up with hematology/oncology ?Repeat CT chest in 3 months for reevaluation of hilar lymphadenopathy ?Follow-up final blood culture results ?Follow-up pathologist smear review ? ?Discharge Diagnoses: ?Principal Problem: ?  Severe thrombocytopenia (HCC) ?Active Problems: ?  Hilar adenopathy ?  Abnormal chest CT ?  Obesity (BMI 30-39.9) ?  Petechial rash ?  Neutropenia (HCC) ?  Positive blood culture ? ?Resolved Problems: ?  Hyperbilirubinemia ? ?Hospital Course: ?Mark Duarte is a 41 y.o. male with no significant history. Patient presented secondary to evidence of thrombocytopenia and neutropenia. No associated bleeding. Hematology/oncology consulted. Empiric antibiotics initiated. 1 unit of platelets transfused to date. Platelets stabilized and improved prior to discharge. ? ?Assessment and Plan: ?* Severe thrombocytopenia (HCC) ?Platelet count of 8,000 on admission with petechiae noted. CT head obtained and was without intracranial bleeding. Hematology/oncology consulted. 1 unit of platelets transfused with rebound platelet count to 16,000. Platelets up to 28,000 prior to discharge. Hematology/oncology assessment is process is likely secondary to acute illness but still considering possible malignancy if presentation does not completely improve and will follow-up as an outpatient. morning on CBC. ? ?Abnormal chest CT ?Chest CT with atelectasis, airway inflammation and focal nodular infiltrate in RUL. Possible evidence of prior atypical infection. Patient started empirically started on Vancomycin, Cefepime and Flagyl. Blood cultures obtained on admission. Respiratory virus panel negative. Antibiotics discontinued prior to  discharge. ? ?Hilar adenopathy ?Noted on CT chest. Will need repeat imaging in 3 months. ? ?Hyperbilirubinemia-resolved as of 04/04/2022 ?Bilirubin of 1.4 on admission, up to 1.5; predominantly indirect. Normal AST/ALT. No associated anemia at this time. Resolved. ? ?Petechial rash ?Likely secondary to thrombocytopenia. Improved today. ? ?Obesity (BMI 30-39.9) ?Body mass index is 36.02 kg/m?. ? ?Positive blood culture ?GPCs on culture with staphylococcus epidermidis on BCID. Antibiotics discontinued secondary to concern for contamination. Repeat blood cultures obtained on 4/16 and are no growth to date prior to discharge. Likely contaminant. Will need to follow-up final results. ? ?Neutropenia (HCC) ?ANC of 100 with increase to 300. Possibly related to viral illness. Afebrile. Empiric Vancomycin/Cefepime/Flagyl initiated. Blood cultures obtained and significant for possible contamination. Neutropenic precautions on discharge. ? ? ? ? ?  ? ? ?Consultants: Hematology/oncology ?Procedures performed: None  ?Disposition: Home ?Diet recommendation:  ?Regular diet ? ?DISCHARGE MEDICATION: ?Allergies as of 04/04/2022   ?No Known Allergies ?  ? ?  ?Medication List  ?  ? ?STOP taking these medications   ? ?chlorpheniramine-HYDROcodone 10-8 MG/5ML Suer ?Commonly known as: Tussionex Pennkinetic ER ?  ?ipratropium 0.06 % nasal spray ?Commonly known as: Atrovent ?  ?predniSONE 20 MG tablet ?Commonly known as: DELTASONE ?  ? ?  ? ?TAKE these medications   ? ?albuterol 108 (90 Base) MCG/ACT inhaler ?Commonly known as: VENTOLIN HFA ?Inhale 2 puffs into the lungs every 4 (four) hours as needed for wheezing or shortness of breath (cough, shortness of breath or wheezing.). ?  ?benzonatate 100 MG capsule ?Commonly known as: TESSALON ?Take 1 capsule (100 mg total) by mouth every 8 (eight) hours. ?What changed:  ?when to take this ?reasons to take this ?  ?fexofenadine 180 MG tablet ?Commonly known as: ALLEGRA ?Take 180 mg by mouth daily as  needed for allergies or rhinitis. ?  ?fluticasone  50 MCG/ACT nasal spray ?Commonly known as: FLONASE ?Place 2 sprays into both nostrils daily. ?What changed:  ?when to take this ?reasons to take this ?  ?Hydromet 5-1.5 MG/5ML syrup ?Generic drug: HYDROcodone bit-homatropine ?Take 5 mLs by mouth at bedtime as needed for cough. ?  ? ?  ? ? Follow-up Information   ? ? Benjiman Core, MD. Go on 04/07/2022.   ?Specialty: Oncology ?Why: 8:00 AM, For hospital follow-up ?Contact information: ?[address] ?[place] [address] ?[phone] ? ? ?  ?  ? ?  ?  ? ?  ? ?Discharge Exam: ? ?BP 116/61 (BP Location: Right Arm)   Pulse 80   Temp 97.6 ?F (36.4 ?C) (Oral)   Resp 18   Ht 5\' 7"  (1.702 m)   Wt 104.3 kg   SpO2 98%   BMI 36.02 kg/m?  ? ?General exam: Appears calm and comfortable ?Respiratory system: Clear to auscultation. Respiratory effort normal. ?Cardiovascular system: S1 & S2 heard, RRR. No murmurs, rubs, gallops or clicks. ?Gastrointestinal system: Abdomen is nondistended, soft and nontender. No organomegaly or masses felt. Normal bowel sounds heard. ?Central nervous system: Alert and oriented. No focal neurological deficits. ?Musculoskeletal: No edema. No calf tenderness ?Skin: No cyanosis. Petechial rash notes on bilateral legs ?Psychiatry: Judgement and insight appear normal. Mood & affect appropriate.  ? ?Condition at discharge: stable ? ?The results of significant diagnostics from this hospitalization (including imaging, microbiology, ancillary and laboratory) are listed below for reference.  ? ?Imaging Studies: ?CT HEAD WO CONTRAST ( ) ? ?Result Date: 04/01/2022 ?CLINICAL DATA:  Headache, sudden, severe. Severe thrombocytopenia with oral petechiae with concern for possible intracranial hemorrhage EXAM: CT HEAD WITHOUT CONTRAST TECHNIQUE: Contiguous axial images were obtained from the base of the skull through the vertex without intravenous contrast. RADIATION DOSE REDUCTION: This exam was  performed according to the departmental dose-optimization program which includes automated exposure control, adjustment of the mA and/or kV according to patient size and/or use of iterative reconstruction technique. COMPARISON:  None. FINDINGS: Brain: Normal anatomic configuration. No abnormal intra or extra-axial mass lesion or fluid collection. No abnormal mass effect or midline shift. No evidence of acute intracranial hemorrhage or infarct. Ventricular size is normal. Cerebellum unremarkable. Vascular: Unremarkable Skull: Intact Sinuses/Orbits: Paranasal sinuses are clear. Orbits are unremarkable. Other: Mastoid air cells and middle ear cavities are clear. IMPRESSION: Normal examination.  No acute intracranial hemorrhage. Electronically Signed   By: 04/03/2022 M.D.   On: 04/01/2022 22:53  ? ?CT Angio Chest/Abd/Pel for Dissection W and/or W/WO ? ?Result Date: 04/01/2022 ?CLINICAL DATA:  Severe thrombocytopenia with spontaneous bruising and petechiae. Also with severe neutropenia. Reported occasional hemoptysis, L flank pain and brusing, atraumatic EXAM: CT ANGIOGRAPHY CHEST, ABDOMEN AND PELVIS TECHNIQUE: Non-contrast CT of the chest was initially obtained. Multidetector CT imaging through the chest, abdomen and pelvis was performed using the standard protocol during bolus administration of intravenous contrast. Multiplanar reconstructed images and MIPs were obtained and reviewed to evaluate the vascular anatomy. RADIATION DOSE REDUCTION: This exam was performed according to the departmental dose-optimization program which includes automated exposure control, adjustment of the mA and/or kV according to patient size and/or use of iterative reconstruction technique. CONTRAST:  04/03/2022 OMNIPAQUE IOHEXOL 350 MG/ML SOLN COMPARISON:  None. FINDINGS: CTA CHEST FINDINGS Cardiovascular: Preferential opacification of the thoracic aorta. No evidence of thoracic aortic aneurysm or dissection. Normal heart size. No pericardial  effusion. Mediastinum/Nodes: There is pathologic right paratracheal and right hilar adenopathy with the index lymph node  measuring 16 mm in short axis diameter within the right hilum at axial image # 40/7. Visualized thyroid

## 2022-04-04 NOTE — Plan of Care (Signed)
Instructions were reviewed with patient. All questions were answered. Patient was transported to main entrance by wheelchair. ° °

## 2022-04-04 NOTE — Progress Notes (Signed)
IP PROGRESS NOTE ? ?Subjective:  ? ?Events noted overnight.  He feels well without any major complaints.  Mild nonproductive cough noted but no active bleeding.  She denies any petechiae or ecchymosis. ? ?Objective: ? ?Vital signs in last 24 hours: ?Temp:  [97.6 ?F (36.4 ?C)-98.5 ?F (36.9 ?C)] 97.6 ?F (36.4 ?C) (04/17 1610) ?Pulse Rate:  [79-89] 80 (04/17 0644) ?Resp:  [14-18] 18 (04/17 9604) ?BP: (116-130)/(61-78) 116/61 (04/17 5409) ?SpO2:  [98 %-99 %] 98 % (04/17 0644) ?Weight change:  ?Last BM Date : 04/03/22 ? ?Intake/Output from previous day: ?04/16 0701 - 04/17 0700 ?In: 1680 [P.O.:1680] ?Out: -  ? ? ? ?General appearance: Comfortable appearing without any discomfort ?Head: Normocephalic without any trauma ?Oropharynx: Mucous membranes are moist and pink without any thrush or ulcers. ?Eyes: Pupils are equal and round reactive to light. ?Lymph nodes: No cervical, supraclavicular, inguinal or axillary lymphadenopathy.   ?Heart:regular rate and rhythm.  S1 and S2 without leg edema. ?Lung: Clear without any rhonchi or wheezes.  No dullness to percussion. ?Abdomin: Soft, nontender, nondistended with good bowel sounds.  No hepatosplenomegaly. ?Musculoskeletal: No joint deformity or effusion.  Full range of motion noted. ?Neurological: No deficits noted on motor, sensory and deep tendon reflex exam. ?Skin: No petechial rash or dryness.  Appeared moist.  ? ? ? ? ?Lab Results: ?Recent Labs  ?  04/03/22 ?0424 04/04/22 ?8119  ?WBC 2.2* 2.6*  ?HGB 13.2 13.0  ?HCT 38.7* 37.7*  ?PLT 17* 28*  ? ? ?BMET ?Recent Labs  ?  04/02/22 ?0306 04/04/22 ?0414  ?NA 140 143  ?K 3.7 3.9  ?CL 107 110  ?CO2 26 26  ?GLUCOSE 112* 131*  ?BUN 19 17  ?CREATININE 0.84 0.65  ?CALCIUM 8.5* 8.6*  ? ? ? ?Medications: I have reviewed the patient's current medications. ? ?Assessment/Plan: ? ?41 year old with: ? ?1.  Severe thrombocytopenia associated with acute neutropenia and lymphocytosis.  The etiology appears to be more likely reactive although a  primary hematological disorder is not completely ruled out. ? ?Laboratory data from today reviewed personally and discussed with the patient and his wife.  His white cell count continues to improve as well as his platelets.  He has no active bleeding and his peripheral smear continues to show more reactive lymphocytes. ? ?At this time, I do not recommend any further transfusion or intervention.  I will defer the option of a bone marrow biopsy given his spontaneous recovery. ? ?2.  Disposition: I do not have any objections to discharge despite his neutropenia.  I will arrange quick follow-up this week to recheck his blood count and discuss further work-up indications.  If his counts drop again then will consider bone marrow biopsy. ? ?35  minutes were dedicated to this visit.  More than 50% of the time was spent face-to-face with the patient and his wife.  During this time his laboratory data, differential diagnosis and management options were reiterated discussed again. ? ? LOS: 3 days  ? ?Zola Button 04/04/2022, 7:38 AM ? ?

## 2022-04-04 NOTE — Progress Notes (Signed)
Date and time results received: 04/04/22 0500 ? ? ?Test: NEUT # ?Critical Value: 0.3 ? ?Name of Provider Notified: Dr. Toniann Fail  ? ?Orders Received? Or Actions Taken?: No new orders at this time ?

## 2022-04-04 NOTE — Telephone Encounter (Signed)
.  Called patient to schedule appointment per 4/17 inbasket, patient is aware of date and time.   ?

## 2022-04-04 NOTE — Progress Notes (Signed)
Transition of Care (TOC) Screening Note ? ?Patient Details  ?Name: Mark Duarte ?Date of Birth: [birth year] ? ?Transition of Care (TOC) CM/SW Contact:    ?Ewing Schlein, LCSW ?Phone Number: ?04/04/2022, 10:48 AM ? ?Transition of Care Department [facility]) has reviewed patient and no TOC needs have been identified at this time. We will continue to monitor patient advancement through interdisciplinary progression rounds. If new patient transition needs arise, please place a TOC consult. ?

## 2022-04-05 LAB — PATHOLOGIST SMEAR REVIEW

## 2022-04-05 NOTE — ED Provider Notes (Signed)
?[name] ?Provider Note ? ? ?CSN: [id number] ?Arrival date & time: 04/01/22  1751 ? ?  ? ?History ? ?Chief Complaint  ?Patient presents with  ? Abnormal Lab  ? ? ?Mark Duarte is a 41 y.o. male. ? ?Patient presents with a rash to his lower extremities.  He went to the doctor and they look petechial.  His doctor checked his platelets and they were low so he sent to the emergency department ? ?The history is provided by the patient and medical records. No language interpreter was used.  ?Diarrhea ?Quality:  Unable to specify ?Severity:  Mild ?Timing:  Rare ?Progression:  Resolved ?Relieved by:  Nothing ?Worsened by:  Nothing ?Associated symptoms: no abdominal pain and no headaches   ?Risk factors: no recent antibiotic use   ? ?  ? ?Home Medications ?Prior to Admission medications   ?Medication Sig Start Date End Date Taking? Authorizing Provider  ?benzonatate (TESSALON) 100 MG capsule Take 1 capsule (100 mg total) by mouth every 8 (eight) hours. ?Patient taking differently: Take 100 mg by mouth 3 (three) times daily as needed for cough. 01/29/18  Yes Yu, Amy V, PA-C  ?fexofenadine (ALLEGRA) 180 MG tablet Take 180 mg by mouth daily as needed for allergies or rhinitis.   Yes [provider]  ?fluticasone (FLONASE) 50 MCG/ACT nasal spray Place 2 sprays into both nostrils daily. ?Patient taking differently: Place 2 sprays into both nostrils daily as needed for allergies or rhinitis. 01/29/18  Yes Yu, Amy V, PA-C  ?HYDROMET 5-1.5 MG/5ML syrup Take 5 mLs by mouth at bedtime as needed for cough.   Yes [provider]  ?albuterol (PROVENTIL HFA;VENTOLIN HFA) 108 (90 Base) MCG/ACT inhaler Inhale 2 puffs into the lungs every 4 (four) hours as needed for wheezing or shortness of breath (cough, shortness of breath or wheezing.). ?Patient not taking: Reported on 04/01/2022 01/31/18   Garnetta Buddy, PA  ?   ? ?Allergies    ?Patient has no known allergies.   ? ?Review of Systems   ?Review  of Systems  ?Constitutional:  Negative for appetite change and fatigue.  ?HENT:  Negative for congestion, ear discharge and sinus pressure.   ?Eyes:  Negative for discharge.  ?Respiratory:  Negative for cough.   ?Cardiovascular:  Negative for chest pain.  ?Gastrointestinal:  Negative for abdominal pain and diarrhea.  ?Genitourinary:  Negative for frequency and hematuria.  ?Musculoskeletal:  Negative for back pain.  ?Skin:  Positive for rash.  ?Neurological:  Negative for seizures and headaches.  ?Psychiatric/Behavioral:  Negative for hallucinations.   ? ?Physical Exam ?Updated Vital Signs ?BP 116/61 (BP Location: Right Arm)   Pulse 80   Temp 97.6 ?F (36.4 ?C) (Oral)   Resp 18   Ht 5\' 7"  (1.702 m)   Wt 104.3 kg   SpO2 98%   BMI 36.02 kg/m?  ?Physical Exam ?Vitals and nursing note reviewed.  ?Constitutional:   ?   Appearance: He is well-developed.  ?HENT:  ?   Head: Normocephalic.  ?   Mouth/Throat:  ?   Mouth: Mucous membranes are moist.  ?Eyes:  ?   General: No scleral icterus. ?   Conjunctiva/sclera: Conjunctivae normal.  ?Neck:  ?   Thyroid: No thyromegaly.  ?Cardiovascular:  ?   Rate and Rhythm: Normal rate and regular rhythm.  ?   Heart sounds: No murmur heard. ?  No friction rub. No gallop.  ?Pulmonary:  ?   Breath sounds: No stridor.  No wheezing or rales.  ?Chest:  ?   Chest wall: No tenderness.  ?Abdominal:  ?   General: There is no distension.  ?   Tenderness: There is no abdominal tenderness. There is no rebound.  ?Musculoskeletal:     ?   General: Normal range of motion.  ?   Cervical back: Neck supple.  ?Lymphadenopathy:  ?   Cervical: No cervical adenopathy.  ?Skin: ?   Findings: Rash present. No erythema.  ?   Comments: Petechial rash  ?Neurological:  ?   Mental Status: He is alert and oriented to person, place, and time.  ?   Motor: No abnormal muscle tone.  ?   Coordination: Coordination normal.  ?Psychiatric:     ?   Behavior: Behavior normal.  ? ? ?ED Results / Procedures / Treatments    ?Labs ?(all labs ordered are listed, but only abnormal results are displayed) ?Labs Reviewed  ?CULTURE, BLOOD (ROUTINE X 2) - Abnormal; Notable for the following components:  ?    Result Value  ? Culture   (*)   ? Value: STAPHYLOCOCCUS EPIDERMIDIS ?THE SIGNIFICANCE OF ISOLATING THIS ORGANISM FROM A SINGLE SET OF BLOOD CULTURES WHEN MULTIPLE SETS ARE DRAWN IS UNCERTAIN. PLEASE NOTIFY THE MICROBIOLOGY DEPARTMENT WITHIN ONE WEEK IF SPECIATION AND SENSITIVITIES ARE REQUIRED. ?Performed at [facility], [address] [place], [place] [address] ?  ? All other components within normal limits  ?BLOOD CULTURE ID PANEL (REFLEXED) - BCID2 - Abnormal; Notable for the following components:  ? Staphylococcus species DETECTED (*)   ? Staphylococcus epidermidis DETECTED (*)   ? Methicillin resistance mecA/C DETECTED (*)   ? All other components within normal limits  ?COMPREHENSIVE METABOLIC PANEL - Abnormal; Notable for the following components:  ? Glucose, Bld 138 (*)   ? BUN 21 (*)   ? Total Bilirubin 1.4 (*)   ? All other components within normal limits  ?CBC WITH DIFFERENTIAL/PLATELET - Abnormal; Notable for the following components:  ? WBC 2.2 (*)   ? HCT 38.7 (*)   ? Platelets 8 (*)   ? Neutro Abs 0.1 (*)   ? All other components within normal limits  ?CBC - Abnormal; Notable for the following components:  ? WBC 2.5 (*)   ? RBC 4.21 (*)   ? Hemoglobin 12.9 (*)   ? HCT 37.2 (*)   ? Platelets 16 (*)   ? All other components within normal limits  ?COMPREHENSIVE METABOLIC PANEL - Abnormal; Notable for the following components:  ? Glucose, Bld 112 (*)   ? Calcium 8.5 (*)   ? Total Protein 6.4 (*)   ? Total Bilirubin 1.5 (*)   ? All other components within normal limits  ?MAGNESIUM - Abnormal; Notable for the following components:  ? Magnesium 2.5 (*)   ? All other components within normal limits  ?BILIRUBIN, FRACTIONATED(TOT/DIR/INDIR) - Abnormal; Notable for the following components:  ? Total Bilirubin 1.4 (*)   ?  Indirect Bilirubin 1.2 (*)   ? All other components within normal limits  ?CBC WITH DIFFERENTIAL/PLATELET - Abnormal; Notable for the following components:  ? WBC 2.2 (*)   ? HCT 38.7 (*)   ? Platelets 17 (*)   ? nRBC 0.9 (*)   ? Neutro Abs 0.3 (*)   ? All other components within normal limits  ?COMPREHENSIVE METABOLIC PANEL - Abnormal; Notable for the following components:  ? Glucose, Bld 131 (*)   ? Calcium 8.6 (*)   ?  Total Protein 6.3 (*)   ? AST 14 (*)   ? All other components within normal limits  ?CBC WITH DIFFERENTIAL/PLATELET - Abnormal; Notable for the following components:  ? WBC 2.6 (*)   ? RBC 4.21 (*)   ? HCT 37.7 (*)   ? Platelets 28 (*)   ? Neutro Abs 0.3 (*)   ? All other components within normal limits  ?CULTURE, BLOOD (ROUTINE X 2)  ?URINE CULTURE  ?RESPIRATORY PANEL BY PCR  ?MRSA NEXT GEN BY PCR, NASAL  ?RESP PANEL BY RT-PCR (FLU A&B, COVID) ARPGX2  ?CULTURE, BLOOD (ROUTINE X 2)  ?CULTURE, BLOOD (ROUTINE X 2)  ?RESP PANEL BY RT-PCR (FLU A&B, COVID) ARPGX2  ?EXPECTORATED SPUTUM ASSESSMENT W GRAM STAIN, RFLX TO RESP C  ?APTT  ?PROTIME-INR  ?HIV ANTIBODY (ROUTINE TESTING W REFLEX)  ?PHOSPHORUS  ?PROCALCITONIN  ?LACTATE DEHYDROGENASE  ?SAVE SMEAR(SSMR), FOR PROVIDER SLIDE REVIEW  ?PATHOLOGIST SMEAR REVIEW  ?PREPARE PLATELET PHERESIS  ?TYPE AND SCREEN  ?ABO/RH  ? ? ?EKG ?None ? ?Radiology ?No results found. ? ?Procedures ?Procedures  ? ? ?Medications Ordered in ED ?Medications  ?sodium chloride (PF) 0.9 % injection (  Canceled Entry 04/01/22 2338)  ?ceFEPIme (MAXIPIME) 2 g in sodium chloride 0.9 % 100 mL IVPB (0 g Intravenous Stopped 04/01/22 2339)  ?vancomycin (VANCOCIN) 2,500 mg in sodium chloride 0.9 % 500 mL IVPB (0 mg Intravenous Stopped 04/02/22 0716)  ?iohexol (OMNIPAQUE) 350 MG/ML injection 100 mL (100 mLs Intravenous Contrast Given 04/01/22 2232)  ? ? ?ED Course/ Medical Decision Making/ A&P ?Patient with petechial rash and extremely low platelets.  I consulted oncology and they recommended  transfusion of platelets and admission to medicine with oncology consult ? ? ?CRITICAL CARE ?Performed by: Bethann BerkshireJoseph Alfred Eckley ?Total critical care time: 35 minutes ?Critical care time was exclusive of separately billable procedur

## 2022-04-06 LAB — CULTURE, BLOOD (ROUTINE X 2)
Culture: NO GROWTH
Special Requests: ADEQUATE

## 2022-04-07 ENCOUNTER — Other Ambulatory Visit: Payer: Self-pay

## 2022-04-07 ENCOUNTER — Inpatient Hospital Stay: Payer: BC Managed Care – PPO | Attending: Oncology

## 2022-04-07 ENCOUNTER — Inpatient Hospital Stay (HOSPITAL_BASED_OUTPATIENT_CLINIC_OR_DEPARTMENT_OTHER): Payer: BC Managed Care – PPO | Admitting: Oncology

## 2022-04-07 VITALS — BP 117/74 | HR 90 | Temp 97.9°F | Resp 17 | Ht 67.0 in | Wt 234.4 lb

## 2022-04-07 DIAGNOSIS — D709 Neutropenia, unspecified: Secondary | ICD-10-CM

## 2022-04-07 DIAGNOSIS — D696 Thrombocytopenia, unspecified: Secondary | ICD-10-CM | POA: Diagnosis not present

## 2022-04-07 LAB — CBC WITH DIFFERENTIAL (CANCER CENTER ONLY)
Abs Immature Granulocytes: 0.02 10*3/uL (ref 0.00–0.07)
Basophils Absolute: 0 10*3/uL (ref 0.0–0.1)
Basophils Relative: 1 %
Eosinophils Absolute: 0.1 10*3/uL (ref 0.0–0.5)
Eosinophils Relative: 3 %
HCT: 39.8 % (ref 39.0–52.0)
Hemoglobin: 13.9 g/dL (ref 13.0–17.0)
Immature Granulocytes: 1 %
Lymphocytes Relative: 61 %
Lymphs Abs: 1.4 10*3/uL (ref 0.7–4.0)
MCH: 30.3 pg (ref 26.0–34.0)
MCHC: 34.9 g/dL (ref 30.0–36.0)
MCV: 86.9 fL (ref 80.0–100.0)
Monocytes Absolute: 0.5 10*3/uL (ref 0.1–1.0)
Monocytes Relative: 20 %
Neutro Abs: 0.3 10*3/uL — CL (ref 1.7–7.7)
Neutrophils Relative %: 14 %
Platelet Count: 74 10*3/uL — ABNORMAL LOW (ref 150–400)
RBC: 4.58 MIL/uL (ref 4.22–5.81)
RDW: 12.4 % (ref 11.5–15.5)
Smear Review: NORMAL
WBC Count: 2.3 10*3/uL — ABNORMAL LOW (ref 4.0–10.5)
nRBC: 0 % (ref 0.0–0.2)

## 2022-04-07 LAB — CMP (CANCER CENTER ONLY)
ALT: 60 U/L — ABNORMAL HIGH (ref 0–44)
AST: 23 U/L (ref 15–41)
Albumin: 4.2 g/dL (ref 3.5–5.0)
Alkaline Phosphatase: 54 U/L (ref 38–126)
Anion gap: 6 (ref 5–15)
BUN: 20 mg/dL (ref 6–20)
CO2: 26 mmol/L (ref 22–32)
Calcium: 9.2 mg/dL (ref 8.9–10.3)
Chloride: 105 mmol/L (ref 98–111)
Creatinine: 1.02 mg/dL (ref 0.61–1.24)
GFR, Estimated: >60 mL/min (ref >60)
Glucose, Bld: 105 mg/dL — ABNORMAL HIGH (ref 70–99)
Potassium: 4.1 mmol/L (ref 3.5–5.1)
Sodium: 137 mmol/L (ref 135–145)
Total Bilirubin: 0.8 mg/dL (ref 0.3–1.2)
Total Protein: 7.2 g/dL (ref 6.5–8.1)

## 2022-04-07 LAB — SAVE SMEAR(SSMR), FOR PROVIDER SLIDE REVIEW

## 2022-04-07 MED ORDER — ALBUTEROL SULFATE HFA 108 (90 BASE) MCG/ACT IN AERS
2.0000 | INHALATION_SPRAY | RESPIRATORY_TRACT | 1 refills | Status: AC | PRN
Start: 2022-04-07 — End: ?

## 2022-04-07 NOTE — Progress Notes (Signed)
Hematology and Oncology Follow Up Visit ? ?Mark Duarte ?[id number] ?06-09-1981 41 y.o. ?04/07/2022 8:23 AM ?Mark Duarte, FNPNo ref. provider found  ? ?Principle Diagnosis: 41 year old with acute l neutropenia and severe thrombocytopenia in setting of a viral infection.  The differential diagnosis included reactive related to severe infection versus infiltrative bone marrow process. ? ? ?Prior Therapy: He is status post platelet transfusion given on April 01, 2022. ? ?Current therapy: Under evaluation for further diagnostic work-up. ? ?Interim History: Mark Duarte returns today for a follow-up visit.  He is a 41 year old man evaluated for acute hospitalization on April 02, 2022.  He presented with petechial rash and fevers and found to have a viral infection and pneumonia.  He was found to have severe thrombocytopenia and neutropenia.  He has slowly recovered during his hospitalization and was discharged on Monday, April 17.  Since his discharge, he feels reasonably well although he still has element of fatigue and tiredness.  No further bleeding complications noted.  He denies any petechiae or ecchymosis. ? ? ? ? ?Medications: I have reviewed the patient's current medications.  ?Current Outpatient Medications  ?Medication Sig Dispense Refill  ? albuterol (PROVENTIL HFA;VENTOLIN HFA) 108 (90 Base) MCG/ACT inhaler Inhale 2 puffs into the lungs every 4 (four) hours as needed for wheezing or shortness of breath (cough, shortness of breath or wheezing.). (Patient not taking: Reported on 04/01/2022) 1 Inhaler 1  ? benzonatate (TESSALON) 100 MG capsule Take 1 capsule (100 mg total) by mouth every 8 (eight) hours. (Patient taking differently: Take 100 mg by mouth 3 (three) times daily as needed for cough.) 21 capsule 0  ? fexofenadine (ALLEGRA) 180 MG tablet Take 180 mg by mouth daily as needed for allergies or rhinitis.    ? fluticasone (FLONASE) 50 MCG/ACT nasal spray Place 2 sprays into both nostrils daily. (Patient  taking differently: Place 2 sprays into both nostrils daily as needed for allergies or rhinitis.) 1 g 0  ? HYDROMET 5-1.5 MG/5ML syrup Take 5 mLs by mouth at bedtime as needed for cough.    ? ?No current facility-administered medications for this visit.  ? ? ? ?Allergies: No Known Allergies ? ? ? ?Physical Exam: ?Blood pressure 117/74, pulse 90, temperature 97.9 ?F (36.6 ?C), temperature source Temporal, resp. rate 17, height _0  (1.702 m), weight 234 lb 6.4 oz (106.3 kg), SpO2 99 %. ? ?ECOG: 0 ? ? ? ?General appearance: Comfortable appearing without any discomfort ?Head: Normocephalic without any trauma ?Oropharynx: Mucous membranes are moist and pink without any thrush or ulcers. ?Eyes: Pupils are equal and round reactive to light. ?Lymph nodes: No cervical, supraclavicular, inguinal or axillary lymphadenopathy.   ?Heart:regular rate and rhythm.  S1 and S2 without leg edema. ?Lung: Clear without any rhonchi or wheezes.  No dullness to percussion. ?Abdomin: Soft, nontender, nondistended with good bowel sounds.  No hepatosplenomegaly. ?Musculoskeletal: No joint deformity or effusion.  Full range of motion noted. ?Neurological: No deficits noted on motor, sensory and deep tendon reflex exam. ?Skin: Faint ecchymosis noted appear to be healing. ? ? ? ?Lab Results: ?Lab Results  ?Component Value Date  ? WBC 2.6 (L) 04/04/2022  ? HGB 13.0 04/04/2022  ? HCT 37.7 (L) 04/04/2022  ? MCV 89.5 04/04/2022  ? PLT 28 (LL) 04/04/2022  ? ?  Chemistry   ?   ?Component Value Date/Time  ? NA 143 04/04/2022 0414  ? K 3.9 04/04/2022 0414  ? CL 110 04/04/2022 0414  ? CO2 26 04/04/2022  0414  ? BUN 17 04/04/2022 0414  ? CREATININE 0.65 04/04/2022 0414  ?    ?Component Value Date/Time  ? CALCIUM 8.6 (L) 04/04/2022 0414  ? ALKPHOS 45 04/04/2022 0414  ? AST 14 (L) 04/04/2022 0414  ? ALT 23 04/04/2022 0414  ? BILITOT 0.4 04/04/2022 0414  ?  ? ? ? ? ? ?Impression and Plan: ? ? ?41 year old with: ? ?1.  Neutropenia and severe thrombocytopenia  noted on April 01, 2022.  He had presented with a platelet count of 8 and absolute neutrophil count of 100.  He received 1 unit of platelet transfusion and no additional growth factor support. ? ?The differential diagnosis was reviewed at this time.  These findings could be related to infiltrative bone marrow process such as acute leukemia versus reactive findings related to his severe viral infection.  His platelet count showed slow recovery without transfusion and his white cell count was also improving on April 17. ? ? ?Laboratory data from today showed platelet count continues to recover spontaneously without any transfusion.  His white cell count continues to be low with absolute neutrophil count around 300.  Peripheral smear personally reviewed today and continues to show abnormal lymphs without any evidence of blasts.  The etiology remains unclear could be related to a viral infection. ? ?I will obtain peripheral blood flow cytometry and consider bone marrow biopsy if these abnormalities persist.  Plan is to repeat laboratory testing in the next 10 days. ? ? ?2.  Follow-up: In the next 7 to 10 days for repeat evaluation. ? ? ?30  minutes were dedicated to this visit. The time was spent on reviewing laboratory data, discussing treatment options, discussing differential diagnosis and answering questions regarding future plan. ? ? ? ?Zola Button, MD ?4/20/20238:23 AM ? ?

## 2022-04-07 NOTE — Progress Notes (Signed)
CRITICAL VALUE STICKER ? ?CRITICAL VALUE: ANC 0.3  ? ?MD NOTIFIED: Dr Alen Blew  ? ?TIME OF NOTIFICATION: 0907 ? ? ?

## 2022-04-08 LAB — CULTURE, BLOOD (ROUTINE X 2)
Culture: NO GROWTH
Culture: NO GROWTH
Special Requests: ADEQUATE
Special Requests: ADEQUATE

## 2022-04-08 LAB — SURGICAL PATHOLOGY

## 2022-04-10 LAB — FLOW CYTOMETRY

## 2022-04-18 ENCOUNTER — Telehealth: Payer: Self-pay | Admitting: Oncology

## 2022-04-18 NOTE — Telephone Encounter (Signed)
Called patient regarding upcoming appointment, left a voicemail. 

## 2022-04-19 ENCOUNTER — Telehealth: Payer: Self-pay

## 2022-04-19 ENCOUNTER — Other Ambulatory Visit: Payer: Self-pay

## 2022-04-19 ENCOUNTER — Inpatient Hospital Stay: Payer: BC Managed Care – PPO | Attending: Oncology

## 2022-04-19 ENCOUNTER — Inpatient Hospital Stay (HOSPITAL_BASED_OUTPATIENT_CLINIC_OR_DEPARTMENT_OTHER): Payer: BC Managed Care – PPO | Admitting: Oncology

## 2022-04-19 VITALS — BP 122/89 | HR 89 | Temp 98.1°F | Resp 19 | Ht 67.0 in | Wt 235.8 lb

## 2022-04-19 DIAGNOSIS — D709 Neutropenia, unspecified: Secondary | ICD-10-CM

## 2022-04-19 DIAGNOSIS — D696 Thrombocytopenia, unspecified: Secondary | ICD-10-CM

## 2022-04-19 DIAGNOSIS — J069 Acute upper respiratory infection, unspecified: Secondary | ICD-10-CM | POA: Insufficient documentation

## 2022-04-19 LAB — CBC WITH DIFFERENTIAL (CANCER CENTER ONLY)
Abs Immature Granulocytes: 0 10*3/uL (ref 0.00–0.07)
Basophils Absolute: 0 10*3/uL (ref 0.0–0.1)
Basophils Relative: 1 %
Eosinophils Absolute: 0 10*3/uL (ref 0.0–0.5)
Eosinophils Relative: 1 %
HCT: 36.9 % — ABNORMAL LOW (ref 39.0–52.0)
Hemoglobin: 13.2 g/dL (ref 13.0–17.0)
Immature Granulocytes: 0 %
Lymphocytes Relative: 72 %
Lymphs Abs: 1.6 10*3/uL (ref 0.7–4.0)
MCH: 30.8 pg (ref 26.0–34.0)
MCHC: 35.8 g/dL (ref 30.0–36.0)
MCV: 86.2 fL (ref 80.0–100.0)
Monocytes Absolute: 0.4 10*3/uL (ref 0.1–1.0)
Monocytes Relative: 19 %
Neutro Abs: 0.2 10*3/uL — CL (ref 1.7–7.7)
Neutrophils Relative %: 7 %
Platelet Count: 20 10*3/uL — ABNORMAL LOW (ref 150–400)
RBC: 4.28 MIL/uL (ref 4.22–5.81)
RDW: 12.6 % (ref 11.5–15.5)
WBC Count: 2.1 10*3/uL — ABNORMAL LOW (ref 4.0–10.5)
nRBC: 0 % (ref 0.0–0.2)

## 2022-04-19 NOTE — Telephone Encounter (Signed)
CRITICAL VALUE STICKER ? ?CRITICAL VALUE: ANC = 0.2 ? ?RECEIVER (on-site recipient of call): Ellen Henri, CMA ? ?DATE & TIME NOTIFIED: 04/19/22 at 02:19pm ? ?MESSENGER (representative from lab): Hillary ? ?MD NOTIFIED: Clelia Croft ? ?TIME OF NOTIFICATION: 04/19/22 at 02:20pm ? ?RESPONSE: Notification provided to Dr. Clelia Croft and Phil Dopp, RN for follow-up with the pt. ? ?

## 2022-04-19 NOTE — Progress Notes (Signed)
Hematology and Oncology Follow Up Visit ? ?Mark Duarte ?[id number] ?1981/08/29 41 y.o. ?04/19/2022 1:54 PM ?Mark Duarte, Mark Lathe, FNP  ? ?Principle Diagnosis: 41 year old male with neutropenia and thrombocytopenia diagnosed in April 2023.  Presentation is related to reactive findings versus infiltrative bone marrow process.  ? ? ?Prior Therapy: He is status post platelet transfusion given on April 01, 2022. ? ?Current therapy: Active surveillance. ? ?Interim History: Mark Duarte presents today for a follow-up visit.  Since the last visit, he reports no major changes in his health.  He was able to travel on vacation and felt back to normal at this time.  He denies any fevers chills or sweats.  He does not report any hematochezia, melena or epistaxis.  He denies any amount of emesis.  He denies any skin rashes or lesions.  He has resumed work related duties. ? ? ? ? ?Medications: Updated on review. ?Current Outpatient Medications  ?Medication Sig Dispense Refill  ? albuterol (VENTOLIN HFA) 108 (90 Base) MCG/ACT inhaler Inhale 2 puffs into the lungs every 4 (four) hours as needed for wheezing or shortness of breath (cough, shortness of breath or wheezing.). 1 each 1  ? benzonatate (TESSALON) 100 MG capsule Take 1 capsule (100 mg total) by mouth every 8 (eight) hours. (Patient taking differently: Take 100 mg by mouth 3 (three) times daily as needed for cough.) 21 capsule 0  ? fexofenadine (ALLEGRA) 180 MG tablet Take 180 mg by mouth daily as needed for allergies or rhinitis.    ? fluticasone (FLONASE) 50 MCG/ACT nasal spray Place 2 sprays into both nostrils daily. (Patient taking differently: Place 2 sprays into both nostrils daily as needed for allergies or rhinitis.) 1 g 0  ? HYDROMET 5-1.5 MG/5ML syrup Take 5 mLs by mouth at bedtime as needed for cough.    ? ?No current facility-administered medications for this visit.  ? ? ? ?Allergies: No Known Allergies ? ? ? ?Physical Exam: ?Blood pressure 122/89,  pulse 89, temperature 98.1 ?F (36.7 ?C), temperature source Temporal, resp. rate 19, height 5' 7" (1.702 m), weight 235 lb 12.8 oz (107 kg), SpO2 98 %. ? ? ?ECOG: 0 ? ? ? ?General appearance: Alert, awake without any distress. ?Head: Atraumatic without abnormalities ?Oropharynx: Without any thrush or ulcers. ?Eyes: No scleral icterus. ?Lymph nodes: No lymphadenopathy noted in the cervical, supraclavicular, or axillary nodes ?Heart:regular rate and rhythm, without any murmurs or gallops.   ?Lung: Clear to auscultation without any rhonchi, wheezes or dullness to percussion. ?Abdomin: Soft, nontender without any shifting dullness or ascites. ?Musculoskeletal: No clubbing or cyanosis. ?Neurological: No motor or sensory deficits. ?Skin: No rashes or lesions. ? ? ? ?Lab Results: ?Lab Results  ?Component Value Date  ? WBC 2.3 (L) 04/07/2022  ? HGB 13.9 04/07/2022  ? HCT 39.8 04/07/2022  ? MCV 86.9 04/07/2022  ? PLT 74 (L) 04/07/2022  ? ?  Chemistry   ?   ?Component Value Date/Time  ? NA 137 04/07/2022 0809  ? K 4.1 04/07/2022 0809  ? CL 105 04/07/2022 0809  ? CO2 26 04/07/2022 0809  ? BUN 20 04/07/2022 0809  ? CREATININE 1.02 04/07/2022 0809  ?    ?Component Value Date/Time  ? CALCIUM 9.2 04/07/2022 0809  ? ALKPHOS 54 04/07/2022 0809  ? AST 23 04/07/2022 0809  ? ALT 60 (H) 04/07/2022 0809  ? BILITOT 0.8 04/07/2022 0809  ?  ? ? ? ? ? ?Impression and Plan: ? ? ?41 year old with: ? ?  1.  Acute neutropenia with severe thrombocytopenia of unclear etiology noted in April 2023.  Differential diagnosis including acute viral infection versus infiltrative bone marrow process were suspected. ? ?Laboratory data from today reviewed and continues to show neutropenia and thrombocytopenia with unclear etiology.  Based on these findings I have recommended proceeding with a bone marrow biopsy at this time.  Risks and benefits of this procedure were discussed at this time.  Complications that include bleeding, infection among others were  reiterated.  The etiology remains unclear at this time. ? ? ? ?2.  Follow-up: Will be in the next 2 weeks for repeat evaluation.   ? ? ?30  minutes were spent on this encounter.  Time was dedicated to reviewing laboratory data, disease status update and outlining future plan of care discussion. ? ? ? ?Zola Button, MD ?5/2/20231:54 PM ? ?

## 2022-04-26 ENCOUNTER — Telehealth: Payer: Self-pay | Admitting: Oncology

## 2022-04-26 NOTE — Telephone Encounter (Signed)
Scheduled per 05/02 los, called patient multiple times. Voicemail was full, calender will be mailed.  ?

## 2022-04-28 ENCOUNTER — Other Ambulatory Visit: Payer: Self-pay | Admitting: Radiology

## 2022-04-28 DIAGNOSIS — D708 Other neutropenia: Secondary | ICD-10-CM

## 2022-04-28 NOTE — H&P (Signed)
Chief Complaint: Patient was seen in consultation today for bone marrow biopsy and aspiration at the request of Shadad,Firas N  Referring Physician(s): Benjiman Core  Supervising Physician: Marliss Coots  Patient Status: [facility] - Out-pt  History of Present Illness: Mark Duarte is a 41 y.o. male with no significant past medical history presented to PCP complaining of petechial rash to lower extremities.  PCP checked platelets that resulted critically low. Patient was sent to ED where he was further diagnosed with neutropenia.  Patient received transfusion and was referred to oncology. Patient was referred by Dr. Clelia Croft for bone marrow biopsy and aspiration to rule out active viral infection versus infiltrative bone marrow process.  Past Medical History:  Diagnosis Date   Allergy    Asthma     Past Surgical History:  Procedure Laterality Date   CYST REMOVAL TRUNK      Allergies: Patient has no known allergies.  Medications: Prior to Admission medications   Medication Sig Start Date End Date Taking? Authorizing Provider  albuterol (VENTOLIN HFA) 108 (90 Base) MCG/ACT inhaler Inhale 2 puffs into the lungs every 4 (four) hours as needed for wheezing or shortness of breath (cough, shortness of breath or wheezing.). 04/07/22   Benjiman Core, MD  benzonatate (TESSALON) 100 MG capsule Take 1 capsule (100 mg total) by mouth every 8 (eight) hours. Patient taking differently: Take 100 mg by mouth 3 (three) times daily as needed for cough. 01/29/18   Cathie Hoops, Amy V, PA-C  fexofenadine (ALLEGRA) 180 MG tablet Take 180 mg by mouth daily as needed for allergies or rhinitis.    [provider]  fluticasone (FLONASE) 50 MCG/ACT nasal spray Place 2 sprays into both nostrils daily. Patient taking differently: Place 2 sprays into both nostrils daily as needed for allergies or rhinitis. 01/29/18   Yu, Amy V, PA-C  HYDROMET 5-1.5 MG/5ML syrup Take 5 mLs by mouth at bedtime as needed for  cough.    [provider]     Family History  Problem Relation Age of Onset   Diabetes Mother    Hypertension Mother    High Cholesterol Mother    Hypertension Father    High Cholesterol Father    Cancer Maternal Grandmother    Heart disease Maternal Grandfather    Diabetes Maternal Grandfather    High Cholesterol Maternal Grandfather    Stroke Maternal Grandfather    Mental illness Maternal Grandfather     Social History   Socioeconomic History   Marital status: Single    Spouse name: Not on file   Number of children: Not on file   Years of education: Not on file   Highest education level: Not on file  Occupational History   Not on file  Tobacco Use   Smoking status: Former    Types: Cigarettes    Quit date: 10/11/2013    Years since quitting: 8.5   Smokeless tobacco: Never  Vaping Use   Vaping Use: Never used  Substance and Sexual Activity   Alcohol use: Yes    Alcohol/week: 5.0 standard drinks    Types: 5 Standard drinks or equivalent per week    Comment: 2 beers/day 5 days a week   Drug use: No   Sexual activity: Not on file  Other Topics Concern   Not on file  Social History Narrative   Not on file   Social Determinants of Health   Financial Resource Strain: Not on file  Food Insecurity: Not on  file  Transportation Needs: Not on file  Physical Activity: Not on file  Stress: Not on file  Social Connections: Not on file     Review of Systems: A 12 point ROS discussed and pertinent positives are indicated in the HPI above.  All other systems are negative.  Review of Systems  Constitutional:  Negative for chills and fever.  HENT:  Negative for nosebleeds.   Respiratory:  Negative for cough and shortness of breath.   Cardiovascular:  Negative for chest pain and leg swelling.  Gastrointestinal:  Negative for abdominal pain, blood in stool, nausea and vomiting.  Genitourinary:  Negative for hematuria.  Neurological:  Negative for dizziness  and headaches.  Hematological:  Bruises/bleeds easily.   Vital Signs: BP (!) 131/97 (BP Location: Right Arm)   Pulse 72   Temp (!) 97.5 F (36.4 C) (Oral)   Resp 15   SpO2 98%   Physical Exam Vitals reviewed.  Constitutional:      General: He is not in acute distress.    Appearance: Normal appearance. He is not ill-appearing.  HENT:     Head: Normocephalic and atraumatic.     Mouth/Throat:     Mouth: Mucous membranes are moist.     Pharynx: Oropharynx is clear.  Eyes:     Extraocular Movements: Extraocular movements intact.     Pupils: Pupils are equal, round, and reactive to light.  Cardiovascular:     Rate and Rhythm: Normal rate and regular rhythm.     Pulses: Normal pulses.     Heart sounds: Normal heart sounds.  Pulmonary:     Effort: Pulmonary effort is normal. No respiratory distress.     Breath sounds: Normal breath sounds.  Abdominal:     General: Bowel sounds are normal. There is no distension.     Tenderness: There is no abdominal tenderness. There is no guarding.  Musculoskeletal:     Right lower leg: No edema.     Left lower leg: No edema.  Skin:    General: Skin is warm and dry.  Neurological:     Mental Status: He is alert and oriented to person, place, and time.  Psychiatric:        Mood and Affect: Mood normal.        Behavior: Behavior normal.        Thought Content: Thought content normal.        Judgment: Judgment normal.    Imaging: CT HEAD WO CONTRAST ( )  Result Date: 04/01/2022 CLINICAL DATA:  Headache, sudden, severe. Severe thrombocytopenia with oral petechiae with concern for possible intracranial hemorrhage EXAM: CT HEAD WITHOUT CONTRAST TECHNIQUE: Contiguous axial images were obtained from the base of the skull through the vertex without intravenous contrast. RADIATION DOSE REDUCTION: This exam was performed according to the departmental dose-optimization program which includes automated exposure control, adjustment of the mA and/or kV  according to patient size and/or use of iterative reconstruction technique. COMPARISON:  None. FINDINGS: Brain: Normal anatomic configuration. No abnormal intra or extra-axial mass lesion or fluid collection. No abnormal mass effect or midline shift. No evidence of acute intracranial hemorrhage or infarct. Ventricular size is normal. Cerebellum unremarkable. Vascular: Unremarkable Skull: Intact Sinuses/Orbits: Paranasal sinuses are clear. Orbits are unremarkable. Other: Mastoid air cells and middle ear cavities are clear. IMPRESSION: Normal examination.  No acute intracranial hemorrhage. Electronically Signed   By: Helyn Numbers M.D.   On: 04/01/2022 22:53   CT Angio Chest/Abd/Pel for Dissection W and/or W/WO  Result Date: 04/01/2022 CLINICAL DATA:  Severe thrombocytopenia with spontaneous bruising and petechiae. Also with severe neutropenia. Reported occasional hemoptysis, L flank pain and brusing, atraumatic EXAM: CT ANGIOGRAPHY CHEST, ABDOMEN AND PELVIS TECHNIQUE: Non-contrast CT of the chest was initially obtained. Multidetector CT imaging through the chest, abdomen and pelvis was performed using the standard protocol during bolus administration of intravenous contrast. Multiplanar reconstructed images and MIPs were obtained and reviewed to evaluate the vascular anatomy. RADIATION DOSE REDUCTION: This exam was performed according to the departmental dose-optimization program which includes automated exposure control, adjustment of the mA and/or kV according to patient size and/or use of iterative reconstruction technique. CONTRAST:  OMNIPAQUE IOHEXOL 350 MG/ML SOLN COMPARISON:  None. FINDINGS: CTA CHEST FINDINGS Cardiovascular: Preferential opacification of the thoracic aorta. No evidence of thoracic aortic aneurysm or dissection. Normal heart size. No pericardial effusion. Mediastinum/Nodes: There is pathologic right paratracheal and right hilar adenopathy with the index lymph node measuring 16 mm in  short axis diameter within the right hilum at axial image # 40/7. Visualized thyroid is unremarkable. Esophagus is unremarkable. Lungs/Pleura: There are scattered areas of subsegmental atelectasis within the lung bases bilaterally,. Focal pulmonary infiltrate is noted within the right upper lobe. There is asymmetric bronchial wall thickening, more severe within the right lung in keeping with airway inflammation. Focal scarring within the right middle lobe with volume loss, pleural retraction, and traction bronchiolectasis is noted. No central obstructing lesion. No pneumothorax or pleural effusion. Musculoskeletal: No acute bone abnormality peer Review of the MIP images confirms the above findings. CTA ABDOMEN AND PELVIS FINDINGS VASCULAR Aorta: Normal caliber aorta without aneurysm, dissection, vasculitis or significant stenosis. Celiac: Patent without evidence of aneurysm, dissection, vasculitis or significant stenosis. SMA: Patent without evidence of aneurysm, dissection, vasculitis or significant stenosis. Renals: Both renal arteries are patent without evidence of aneurysm, dissection, vasculitis, fibromuscular dysplasia or significant stenosis. IMA: Patent without evidence of aneurysm, dissection, vasculitis or significant stenosis. Inflow: Patent without evidence of aneurysm, dissection, vasculitis or significant stenosis. Veins: No obvious venous abnormality within the limitations of this arterial phase study. Review of the MIP images confirms the above findings. NON-VASCULAR Hepatobiliary: No focal liver abnormality is seen. No gallstones, gallbladder wall thickening, or biliary dilatation. Pancreas: Unremarkable Spleen: Normal in size without focal abnormality. Adrenals/Urinary Tract: Adrenal glands are unremarkable. Kidneys are normal, without renal calculi, focal lesion, or hydronephrosis. Bladder is unremarkable. Stomach/Bowel: Mild cecal diverticulosis without superimposed acute inflammatory change. The  stomach, small bowel, and large bowel are otherwise unremarkable. Appendix normal. No free intraperitoneal gas or fluid. Lymphatic: No pathologic adenopathy within the abdomen and pelvis. Reproductive: Prostate is unremarkable. Other: No abdominal wall hernia. Musculoskeletal: Bilateral L5 pars defects are present with grade 1 anterolisthesis L5-S1. Superimposed degenerative changes are noted at this level. No acute bone abnormality. No lytic or blastic bone lesion identified. Review of the MIP images confirms the above findings. IMPRESSION: Scattered areas of subsegmental atelectasis, airway inflammation, and focal nodular infiltrate within the right upper lobe, in keeping with atypical infection in the appropriate clinical setting. Pathologic right paratracheal and right hilar adenopathy may be reactive in nature. Follow-up examination in 3 months would be helpful in documenting resolution once the patient's acute issues have resolved. Mild cecal diverticulosis. Bilateral L5 pars defects with associated grade 1 anterolisthesis. Electronically Signed   By: Helyn Numbers M.D.   On: 04/01/2022 23:05    Labs:  CBC: Recent Labs    04/03/22 0424 04/04/22 0414 04/07/22 0809 04/19/22 1327  WBC 2.2* 2.6* 2.3* 2.1*  HGB 13.2 13.0 13.9 13.2  HCT 38.7* 37.7* 39.8 36.9*  PLT 17* 28* 74* 20*    COAGS: Recent Labs    04/01/22 1900 04/01/22 1904  INR  --  1.0  APTT 28  --     BMP: Recent Labs    04/01/22 1825 04/02/22 0306 04/04/22 0414 04/07/22 0809  NA 140 140 143 137  K 3.9 3.7 3.9 4.1  CL 107 107 110 105  CO2 26 26 26 26   GLUCOSE 138* 112* 131* 105*  BUN 21* 19 17 20   CALCIUM 8.9 8.5* 8.6* 9.2  CREATININE 1.14 0.84 0.65 1.02  GFRNONAA >60 >60 >60 >60    LIVER FUNCTION TESTS: Recent Labs    04/01/22 1825 04/02/22 0306 04/04/22 0414 04/07/22 0809  BILITOT 1.4* 1.4*  1.5* 0.4 0.8  AST 22 15 14* 23  ALT 33 22 23 60*  ALKPHOS 54 47 45 54  PROT 7.2 6.4* 6.3* 7.2  ALBUMIN 4.1  3.6 3.5 4.2    TUMOR MARKERS: No results for input(s): AFPTM, CEA, CA199, CHROMGRNA in the last 8760 hours.  Assessment and Plan: Patient with no significant past medical history presented to PCP complaining of petechial rash to lower extremities.  PCP checked platelets that resulted critically low. Patient was sent to ED where he was further diagnosed with neutropenia.  Patient received transfusion and was referred to oncology. Patient was referred by Dr. Clelia Croft for bone marrow biopsy and aspiration to rule out active viral infection versus infiltrative bone marrow process.  Pt resting on stretcher. He is A&O, calm and pleasant.  He is in no distress.  Pt is NPO per order.  Last labs 04/19/22: PLT 20, WBC 2.1 Pt denies the use of blood thinning medications.  Today's labs pending.   Risks and benefits of bone marrow biopsy and aspiration with moderate sedation was discussed with the patient and/or patient's family including, but not limited to bleeding, infection, damage to adjacent structures or low yield requiring additional tests.  All of the questions were answered and there is agreement to proceed.  Consent signed and in chart.   Thank you for this interesting consult.  I greatly enjoyed meeting Mark Duarte and look forward to participating in their care.  A copy of this report was sent to the requesting provider on this date.  Electronically Signed: Shon Hough, NP 04/29/2022, 9:45 AM   I spent a total of 20 minutes in face to face in clinical consultation, greater than 50% of which was counseling/coordinating care for bone marrow biopsy and aspiration.

## 2022-04-29 ENCOUNTER — Encounter (HOSPITAL_COMMUNITY): Payer: Self-pay

## 2022-04-29 ENCOUNTER — Ambulatory Visit (HOSPITAL_COMMUNITY)
Admission: RE | Admit: 2022-04-29 | Discharge: 2022-04-29 | Disposition: A | Discharge status: Home / Self Care | Payer: BC Managed Care – PPO | Source: Ambulatory Visit | Attending: Oncology | Admitting: Oncology

## 2022-04-29 ENCOUNTER — Other Ambulatory Visit: Payer: Self-pay

## 2022-04-29 DIAGNOSIS — D72819 Decreased white blood cell count, unspecified: Secondary | ICD-10-CM | POA: Insufficient documentation

## 2022-04-29 DIAGNOSIS — D708 Other neutropenia: Secondary | ICD-10-CM | POA: Insufficient documentation

## 2022-04-29 DIAGNOSIS — D619 Aplastic anemia, unspecified: Secondary | ICD-10-CM | POA: Diagnosis not present

## 2022-04-29 DIAGNOSIS — D709 Neutropenia, unspecified: Secondary | ICD-10-CM | POA: Diagnosis not present

## 2022-04-29 DIAGNOSIS — D696 Thrombocytopenia, unspecified: Secondary | ICD-10-CM | POA: Insufficient documentation

## 2022-04-29 DIAGNOSIS — D649 Anemia, unspecified: Secondary | ICD-10-CM | POA: Diagnosis not present

## 2022-04-29 LAB — CBC WITH DIFFERENTIAL/PLATELET
Abs Immature Granulocytes: 0 10*3/uL (ref 0.00–0.07)
Basophils Absolute: 0 10*3/uL (ref 0.0–0.1)
Basophils Relative: 0 %
Eosinophils Absolute: 0 10*3/uL (ref 0.0–0.5)
Eosinophils Relative: 2 %
HCT: 42.5 % (ref 39.0–52.0)
Hemoglobin: 15 g/dL (ref 13.0–17.0)
Immature Granulocytes: 0 %
Lymphocytes Relative: 66 %
Lymphs Abs: 1.5 10*3/uL (ref 0.7–4.0)
MCH: 30.9 pg (ref 26.0–34.0)
MCHC: 35.3 g/dL (ref 30.0–36.0)
MCV: 87.6 fL (ref 80.0–100.0)
Monocytes Absolute: 0.4 10*3/uL (ref 0.1–1.0)
Monocytes Relative: 15 %
Neutro Abs: 0.4 10*3/uL — CL (ref 1.7–7.7)
Neutrophils Relative %: 17 %
Platelets: 25 10*3/uL — CL (ref 150–400)
RBC: 4.85 MIL/uL (ref 4.22–5.81)
RDW: 12.7 % (ref 11.5–15.5)
WBC: 2.3 10*3/uL — ABNORMAL LOW (ref 4.0–10.5)
nRBC: 0 % (ref 0.0–0.2)

## 2022-04-29 MED ORDER — FLUMAZENIL 0.5 MG/5ML IV SOLN
INTRAVENOUS | Status: AC
  Filled 2022-04-29: qty 5

## 2022-04-29 MED ORDER — FENTANYL CITRATE (PF) 100 MCG/2ML IJ SOLN
INTRAMUSCULAR | Status: AC | PRN
  Administered 2022-04-29: 50 ug via INTRAVENOUS

## 2022-04-29 MED ORDER — LIDOCAINE HCL 1 % IJ SOLN
INTRAMUSCULAR | Status: AC | PRN
  Administered 2022-04-29: 10 mL via INTRADERMAL

## 2022-04-29 MED ORDER — MIDAZOLAM HCL 2 MG/2ML IJ SOLN
INTRAMUSCULAR | Status: AC | PRN
  Administered 2022-04-29: 2 mg via INTRAVENOUS

## 2022-04-29 MED ORDER — NALOXONE HCL 0.4 MG/ML IJ SOLN
INTRAMUSCULAR | Status: AC
  Filled 2022-04-29: qty 1

## 2022-04-29 MED ORDER — SODIUM CHLORIDE 0.9 % IV SOLN: INTRAVENOUS | Status: DC

## 2022-04-29 MED ORDER — FENTANYL CITRATE (PF) 100 MCG/2ML IJ SOLN
INTRAMUSCULAR | Status: AC
  Filled 2022-04-29: qty 4

## 2022-04-29 MED ORDER — MIDAZOLAM HCL 2 MG/2ML IJ SOLN
INTRAMUSCULAR | Status: AC
  Filled 2022-04-29: qty 6

## 2022-04-29 NOTE — Procedures (Signed)
Interventional Radiology Procedure Note  Procedure: CT guided aspirate and core biopsy of right iliac bone  Complications: None  Recommendations: - Bedrest supine x 1 hrs - Hydrocodone PRN  Pain - Follow biopsy results   Ivalene Platte, MD   

## 2022-04-29 NOTE — Discharge Instructions (Signed)
Discharge Instructions:   Please call Interventional Radiology clinic [phone] with any questions or concerns.  You may remove your dressing and shower tomorrow.    Bone Marrow Aspiration and Bone Marrow Biopsy, Adult, Care After This sheet gives you information about how to care for yourself after your procedure. Your health care provider may also give you more specific instructions. If you have problems or questions, contact your health care provider. What can I expect after the procedure? After the procedure, it is common to have: Mild pain and tenderness. Swelling. Bruising. Follow these instructions at home: Puncture site care  Follow instructions from your health care provider about how to take care of the puncture site. Make sure you: Wash your hands with soap and water before and after you change your bandage (dressing). If soap and water are not available, use hand sanitizer. Change your dressing as told by your health care provider. Check your puncture site every day for signs of infection. Check for: More redness, swelling, or pain. Fluid or blood. Warmth. Pus or a bad smell. Activity Return to your normal activities as told by your health care provider. Ask your health care provider what activities are safe for you. Do not lift anything that is heavier than 10 lb (4.5 kg), or the limit that you are told, until your health care provider says that it is safe. Do not drive for 24 hours if you were given a sedative during your procedure. General instructions  Take over-the-counter and prescription medicines only as told by your health care provider. Do not take baths, swim, or use a hot tub until your health care provider approves. Ask your health care provider if you may take showers. You may only be allowed to take sponge baths. If directed, put ice on the affected area. To do this: Put ice in a plastic bag. Place a towel between your skin and the bag. Leave the ice  on for 20 minutes, 2-3 times a day. Keep all follow-up visits as told by your health care provider. This is important. Contact a health care provider if: Your pain is not controlled with medicine. You have a fever. You have more redness, swelling, or pain around the puncture site. You have fluid or blood coming from the puncture site. Your puncture site feels warm to the touch. You have pus or a bad smell coming from the puncture site. Summary After the procedure, it is common to have mild pain, tenderness, swelling, and bruising. Follow instructions from your health care provider about how to take care of the puncture site and what activities are safe for you. Take over-the-counter and prescription medicines only as told by your health care provider. Contact a health care provider if you have any signs of infection, such as fluid or blood coming from the puncture site. This information is not intended to replace advice given to you by your health care provider. Make sure you discuss any questions you have with your health care provider. Document Revised: 04/23/2019 Document Reviewed: 04/23/2019 Elsevier Patient Education  2023 [employer] Inc[employer]   Moderate Conscious Sedation, Adult, Care After This sheet gives you information about how to care for yourself after your procedure. Your health care provider may also give you more specific instructions. If you have problems or questions, contact your health care provider. What can I expect after the procedure? After the procedure, it is common to have: Sleepiness for several hours. Impaired judgment for several hours. Difficulty with balance. Vomiting if   you eat too soon. Follow these instructions at home: For the time period you were told by your health care provider: Rest. Do not participate in activities where you could fall or become injured. Do not drive or use machinery. Do not drink alcohol. Do not take sleeping pills or medicines that  cause drowsiness. Do not make important decisions or sign legal documents. Do not take care of children on your own. Eating and drinking  Follow the diet recommended by your health care provider. Drink enough fluid to keep your urine pale yellow. If you vomit: Drink water, juice, or soup when you can drink without vomiting. Make sure you have little or no nausea before eating solid foods. General instructions Take over-the-counter and prescription medicines only as told by your health care provider. Have a responsible adult stay with you for the time you are told. It is important to have someone help care for you until you are awake and alert. Do not smoke. Keep all follow-up visits as told by your health care provider. This is important. Contact a health care provider if: You are still sleepy or having trouble with balance after 24 hours. You feel light-headed. You keep feeling nauseous or you keep vomiting. You develop a rash. You have a fever. You have redness or swelling around the IV site. Get help right away if: You have trouble breathing. You have new-onset confusion at home. Summary After the procedure, it is common to feel sleepy, have impaired judgment, or feel nauseous if you eat too soon. Rest after you get home. Know the things you should not do after the procedure. Follow the diet recommended by your health care provider and drink enough fluid to keep your urine pale yellow. Get help right away if you have trouble breathing or new-onset confusion at home. This information is not intended to replace advice given to you by your health care provider. Make sure you discuss any questions you have with your health care provider. Document Revised: 04/03/2020 Document Reviewed: 10/31/2019 Elsevier Patient Education  2023 Elsevier Inc.  

## 2022-04-29 NOTE — Progress Notes (Signed)
Date and time results received: 04/29/22 1050 ?(use smartphrase ".now" to insert current time) ? ?Test: PLT 25  and Neutro Abs 0.2 ?Critical Value: PLT 25  and Neutro Abs 0.2 ? ?Name of Provider Notified: Jeananne Rama, PA ? ?Orders Received? Or Actions Taken?:  No orders given ?

## 2022-05-02 ENCOUNTER — Inpatient Hospital Stay: Payer: BC Managed Care – PPO

## 2022-05-02 ENCOUNTER — Inpatient Hospital Stay: Payer: BC Managed Care – PPO | Admitting: Oncology

## 2022-05-02 ENCOUNTER — Telehealth: Payer: Self-pay | Admitting: Oncology

## 2022-05-02 NOTE — Telephone Encounter (Signed)
Scheduled per 5/15 in basket, pt has been called and confirmed appt ?

## 2022-05-03 LAB — SURGICAL PATHOLOGY

## 2022-05-09 ENCOUNTER — Inpatient Hospital Stay (HOSPITAL_BASED_OUTPATIENT_CLINIC_OR_DEPARTMENT_OTHER): Payer: BC Managed Care – PPO | Admitting: Oncology

## 2022-05-09 ENCOUNTER — Inpatient Hospital Stay: Payer: BC Managed Care – PPO

## 2022-05-09 ENCOUNTER — Other Ambulatory Visit: Payer: Self-pay

## 2022-05-09 VITALS — BP 121/75 | HR 87 | Temp 97.7°F | Resp 18 | Ht 67.0 in | Wt 234.0 lb

## 2022-05-09 DIAGNOSIS — D696 Thrombocytopenia, unspecified: Secondary | ICD-10-CM

## 2022-05-09 DIAGNOSIS — J069 Acute upper respiratory infection, unspecified: Secondary | ICD-10-CM | POA: Diagnosis not present

## 2022-05-09 DIAGNOSIS — D709 Neutropenia, unspecified: Secondary | ICD-10-CM

## 2022-05-09 LAB — CBC WITH DIFFERENTIAL (CANCER CENTER ONLY)
Abs Immature Granulocytes: 0 10*3/uL (ref 0.00–0.07)
Basophils Absolute: 0 10*3/uL (ref 0.0–0.1)
Basophils Relative: 0 %
Eosinophils Absolute: 0 10*3/uL (ref 0.0–0.5)
Eosinophils Relative: 1 %
HCT: 38.4 % — ABNORMAL LOW (ref 39.0–52.0)
Hemoglobin: 13.7 g/dL (ref 13.0–17.0)
Immature Granulocytes: 0 %
Lymphocytes Relative: 53 %
Lymphs Abs: 1.7 10*3/uL (ref 0.7–4.0)
MCH: 30.3 pg (ref 26.0–34.0)
MCHC: 35.7 g/dL (ref 30.0–36.0)
MCV: 85 fL (ref 80.0–100.0)
Monocytes Absolute: 0.9 10*3/uL (ref 0.1–1.0)
Monocytes Relative: 28 %
Neutro Abs: 0.6 10*3/uL — ABNORMAL LOW (ref 1.7–7.7)
Neutrophils Relative %: 18 %
Platelet Count: 50 10*3/uL — ABNORMAL LOW (ref 150–400)
RBC: 4.52 MIL/uL (ref 4.22–5.81)
RDW: 12.2 % (ref 11.5–15.5)
Smear Review: NORMAL
WBC Count: 3.2 10*3/uL — ABNORMAL LOW (ref 4.0–10.5)
nRBC: 0 % (ref 0.0–0.2)

## 2022-05-09 NOTE — Progress Notes (Signed)
Hematology and Oncology Follow Up Visit  Mark Duarte [id number] 05/18/1981 41 y.o. 05/09/2022 9:13 AM Mark Duarte [place], FNP   Principle Diagnosis: 41 year old male with neutropenia and thrombocytopenia likely related to reactive causes and viral infection diagnosed in April 2023.     Prior Therapy:   He is status post platelet transfusion given on April 01, 2022.  He is status post a bone marrow biopsy completed on Apr 29, 2022.  The results did not show any infiltrative bone marrow process.  Current therapy: Active surveillance.  Interim History: Mark Duarte is here for a follow-up visit.  Since last visit, he completed a bone marrow biopsy without any any complications.  He continues to have issues with upper respiratory tract infection including sinus congestion and nonproductive cough.  He denies any fevers, chills or sweats.  He also had a GI viral illness which has recovered.  His performance status quality of life remains unchanged.     Medications: Reviewed without changes. Current Outpatient Medications  Medication Sig Dispense Refill   albuterol (VENTOLIN HFA) 108 (90 Base) MCG/ACT inhaler Inhale 2 puffs into the lungs every 4 (four) hours as needed for wheezing or shortness of breath (cough, shortness of breath or wheezing.). 1 each 1   benzonatate (TESSALON) 100 MG capsule Take 1 capsule (100 mg total) by mouth every 8 (eight) hours. (Patient taking differently: Take 100 mg by mouth 3 (three) times daily as needed for cough.) 21 capsule 0   fexofenadine (ALLEGRA) 180 MG tablet Take 180 mg by mouth daily as needed for allergies or rhinitis.     fluticasone (FLONASE) 50 MCG/ACT nasal spray Place 2 sprays into both nostrils daily. (Patient taking differently: Place 2 sprays into both nostrils daily as needed for allergies or rhinitis.) 1 g 0   HYDROMET 5-1.5 MG/5ML syrup Take 5 mLs by mouth at bedtime as needed for cough.     Probiotic Product (PROBIOTIC  GUMMIES) 30 MG CHEW Chew 30 g/day by mouth every morning. Taking two po daily     No current facility-administered medications for this visit.     Allergies: No Known Allergies    Physical Exam:  Blood pressure 121/75, pulse 87, temperature 97.7 F (36.5 C), temperature source Temporal, resp. rate 18, height 5' 7"  (1.702 m), weight 234 lb (106.1 kg), SpO2 100 %.   ECOG: 0    General appearance: Comfortable appearing without any discomfort Head: Normocephalic without any trauma Oropharynx: Mucous membranes are moist and pink without any thrush or ulcers. Eyes: Pupils are equal and round reactive to light. Lymph nodes: No cervical, supraclavicular, inguinal or axillary lymphadenopathy.   Heart:regular rate and rhythm.  S1 and S2 without leg edema. Lung: Clear without any rhonchi or wheezes.  No dullness to percussion. Abdomin: Soft, nontender, nondistended with good bowel sounds.  No hepatosplenomegaly. Musculoskeletal: No joint deformity or effusion.  Full range of motion noted. Neurological: No deficits noted on motor, sensory and deep tendon reflex exam. Skin: No petechial rash or dryness.  Appeared moist.     Lab Results: Lab Results  Component Value Date   WBC 2.3 (L) 04/29/2022   HGB 15.0 04/29/2022   HCT 42.5 04/29/2022   MCV 87.6 04/29/2022   PLT 25 (LL) 04/29/2022     Chemistry      Component Value Date/Time   NA 137 04/07/2022 0809   K 4.1 04/07/2022 0809   CL 105 04/07/2022 0809   CO2 26 04/07/2022 0809  BUN 20 04/07/2022 0809   CREATININE 1.02 04/07/2022 0809      Component Value Date/Time   CALCIUM 9.2 04/07/2022 0809   ALKPHOS 54 04/07/2022 0809   AST 23 04/07/2022 0809   ALT 60 (H) 04/07/2022 0809   BILITOT 0.8 04/07/2022 0809         Impression and Plan:   41 year old with:  1.  Thrombocytopenia neutropenia diagnosed in April 2023.  This was post viral illness without any clear-cut etiology of a bone marrow disease.     Bone  marrow biopsy obtained on Apr 29, 2022 was personally reviewed with the patient as well as with the reviewing pathologist which showed no infiltrative bone marrow process.  There is no evidence of leukemia, myelodysplastic syndrome or myeloproliferative disorder.  There is no marrow fibrosis.  All these findings indicate reactive process although does not explain the slow recovery from his platelet and white cell count.  Laboratory data from today reviewed and continues to show slow recovery with platelet count up to 50 and absolute neutrophil count is 600.   At this time, I do not recommend any transfusion or growth factor support rather than continued monitoring.  2.  Upper respiratory tract infection: He has a mild neutropenia which appears to be improving.  To prevent superimposed bacterial infection I recommended a course of azithromycin which will be prescribed to him.   3.  Follow-up: Laboratory evaluation in 2 weeks.  No additional MD follow-up needed if his counts continue to recover.   30  minutes were dedicated to this visit.  The time was spent on reviewing laboratory data, disease status update and outlining future plan of care discussion.    Zola Button, MD 5/22/20239:13 AM

## 2022-05-10 ENCOUNTER — Other Ambulatory Visit: Payer: Self-pay | Admitting: *Deleted

## 2022-05-10 ENCOUNTER — Telehealth: Payer: Self-pay | Admitting: *Deleted

## 2022-05-10 ENCOUNTER — Encounter (HOSPITAL_COMMUNITY): Payer: Self-pay | Admitting: Oncology

## 2022-05-10 DIAGNOSIS — J069 Acute upper respiratory infection, unspecified: Secondary | ICD-10-CM

## 2022-05-10 MED ORDER — AZITHROMYCIN 250 MG PO TABS: ORAL_TABLET | ORAL | 0 refills | Status: DC

## 2022-05-10 NOTE — Telephone Encounter (Signed)
-----   Message from Benjiman Core, MD sent at 05/10/2022 12:11 PM EDT ----- Regarding: RE: Antibiotic Yes. Please send Rx for Zpak. Use as directed. Thanks ----- Message ----- From: Arville Care, RN Sent: 05/10/2022  11:49 AM EDT To: Benjiman Core, MD Subject: Antibiotic                                     Mr Calzadilla wife called asking about an antibiotic being prescribed for him.  Looks like from your note yesterday he is to start taking azithromycin.  Would you like me to sent this in?

## 2022-05-10 NOTE — Telephone Encounter (Signed)
PC to patient, informed him rx for Z-pack has been sent to his pharmacy, he is to take two tablets on day one, and then 1 tablet for the next four days.  He verbalizes understanding.

## 2022-05-17 ENCOUNTER — Encounter (HOSPITAL_COMMUNITY): Payer: Self-pay | Admitting: Oncology

## 2022-05-19 ENCOUNTER — Telehealth: Payer: Self-pay | Admitting: Oncology

## 2022-05-19 NOTE — Telephone Encounter (Signed)
Scheduled per 05/22 los, patient has been called and notified. 

## 2022-05-27 ENCOUNTER — Telehealth: Payer: Self-pay

## 2022-05-27 NOTE — Telephone Encounter (Signed)
Pt called to advise that he has increased "petechiae" on feet and lower legs. Patient has a lab only appointment on 6/13. Patient asked that Dr. Clelia Croft be made aware.  Routed to provider.

## 2022-05-31 ENCOUNTER — Other Ambulatory Visit: Payer: Self-pay

## 2022-05-31 ENCOUNTER — Inpatient Hospital Stay: Payer: BC Managed Care – PPO | Attending: Oncology

## 2022-05-31 ENCOUNTER — Telehealth: Payer: Self-pay

## 2022-05-31 ENCOUNTER — Other Ambulatory Visit: Payer: Self-pay | Admitting: Oncology

## 2022-05-31 DIAGNOSIS — D696 Thrombocytopenia, unspecified: Secondary | ICD-10-CM | POA: Insufficient documentation

## 2022-05-31 DIAGNOSIS — D709 Neutropenia, unspecified: Secondary | ICD-10-CM | POA: Diagnosis not present

## 2022-05-31 LAB — CBC WITH DIFFERENTIAL (CANCER CENTER ONLY)
Abs Immature Granulocytes: 0 10*3/uL (ref 0.00–0.07)
Basophils Absolute: 0 10*3/uL (ref 0.0–0.1)
Basophils Relative: 0 %
Eosinophils Absolute: 0 10*3/uL (ref 0.0–0.5)
Eosinophils Relative: 2 %
HCT: 38.8 % — ABNORMAL LOW (ref 39.0–52.0)
Hemoglobin: 13.7 g/dL (ref 13.0–17.0)
Immature Granulocytes: 0 %
Lymphocytes Relative: 71 %
Lymphs Abs: 1.5 10*3/uL (ref 0.7–4.0)
MCH: 30.1 pg (ref 26.0–34.0)
MCHC: 35.3 g/dL (ref 30.0–36.0)
MCV: 85.3 fL (ref 80.0–100.0)
Monocytes Absolute: 0.3 10*3/uL (ref 0.1–1.0)
Monocytes Relative: 16 %
Neutro Abs: 0.2 10*3/uL — CL (ref 1.7–7.7)
Neutrophils Relative %: 11 %
Platelet Count: 11 10*3/uL — ABNORMAL LOW (ref 150–400)
RBC: 4.55 MIL/uL (ref 4.22–5.81)
RDW: 13.2 % (ref 11.5–15.5)
WBC Count: 2.1 10*3/uL — ABNORMAL LOW (ref 4.0–10.5)
nRBC: 1 % — ABNORMAL HIGH (ref 0.0–0.2)

## 2022-05-31 MED ORDER — PREDNISONE 20 MG PO TABS: 60.0000 mg | ORAL_TABLET | Freq: Every day | ORAL | 1 refills | Status: DC

## 2022-05-31 NOTE — Telephone Encounter (Signed)
This patient called and left a message regarding his worsening symptoms of bruising/petechiae. He has a lab appointment today at 1 pm and wants to know if Dr. Clelia Croft can see him afterwards. Will forward to Dr. Clelia Croft.

## 2022-05-31 NOTE — Telephone Encounter (Signed)
Returned patient's call regarding his concern of bruising/petechiae. Left vm instructing patient to stay after his lab appointment so Dr. Clelia Croft may speak to him. Informed patient to call back if he had any further questions or concerns.

## 2022-05-31 NOTE — Progress Notes (Signed)
CRITICAL VALUE STICKER  CRITICAL VALUE: anc 0.2  RECEIVER (on-site recipient of call): Jefry Lesinski, LPN  DATE & TIME NOTIFIED: 05/31/22 1352  MESSENGER (representative from lab):  MD NOTIFIED: Shadad  TIME OF NOTIFICATION: 1358  RESPONSE: Dr. Clelia Croft is talking with patient.

## 2022-05-31 NOTE — Progress Notes (Signed)
Results of his CBC was personally reviewed today and discussed with the patient.  He is experiencing petechial rash and bruising but no bleeding.  The differential diagnosis was discussed again today and the etiology remains unclear.  Despite a bone marrow biopsy and supportive management he continues to have fluctuating thrombocytopenia without any clear etiology.  He has also continues to have neutropenia.  He denies any recent infections or new medications.  After discussion today, I have recommended a trial of steroids again 60 mg daily and do a platelet transfusion if his platelet count below 10 or active bleeding is noted.  I will also arrange for a opinion at [facility] for a second opinion.

## 2022-06-01 ENCOUNTER — Telehealth: Payer: Self-pay | Admitting: Oncology

## 2022-06-01 NOTE — Telephone Encounter (Signed)
.  Called pt per 6/13 staff message , Patient was unavailable, a message with appt time and date was left with number on file.

## 2022-06-03 ENCOUNTER — Telehealth: Payer: Self-pay | Admitting: *Deleted

## 2022-06-03 NOTE — Telephone Encounter (Signed)
Notified to watch site per Dr Clelia Croft.

## 2022-06-03 NOTE — Telephone Encounter (Signed)
Mark Duarte states the bruise on his side is starting to get more raised and painful. It is not bigger in area. Wants to know if he needs to be seen by primary care.

## 2022-06-07 ENCOUNTER — Other Ambulatory Visit: Payer: Self-pay | Admitting: Lab

## 2022-06-07 ENCOUNTER — Other Ambulatory Visit: Payer: Self-pay

## 2022-06-07 ENCOUNTER — Telehealth: Payer: Self-pay | Admitting: *Deleted

## 2022-06-07 ENCOUNTER — Inpatient Hospital Stay: Payer: BC Managed Care – PPO

## 2022-06-07 DIAGNOSIS — D696 Thrombocytopenia, unspecified: Secondary | ICD-10-CM | POA: Diagnosis not present

## 2022-06-07 DIAGNOSIS — D709 Neutropenia, unspecified: Secondary | ICD-10-CM | POA: Diagnosis not present

## 2022-06-07 LAB — CBC WITH DIFFERENTIAL (CANCER CENTER ONLY)
Abs Immature Granulocytes: 0.12 10*3/uL — ABNORMAL HIGH (ref 0.00–0.07)
Basophils Absolute: 0 10*3/uL (ref 0.0–0.1)
Basophils Relative: 0 %
Eosinophils Absolute: 0 10*3/uL (ref 0.0–0.5)
Eosinophils Relative: 0 %
HCT: 41.8 % (ref 39.0–52.0)
Hemoglobin: 14.4 g/dL (ref 13.0–17.0)
Immature Granulocytes: 4 %
Lymphocytes Relative: 32 %
Lymphs Abs: 1 10*3/uL (ref 0.7–4.0)
MCH: 29.8 pg (ref 26.0–34.0)
MCHC: 34.4 g/dL (ref 30.0–36.0)
MCV: 86.4 fL (ref 80.0–100.0)
Monocytes Absolute: 0.3 10*3/uL (ref 0.1–1.0)
Monocytes Relative: 8 %
Neutro Abs: 1.8 10*3/uL (ref 1.7–7.7)
Neutrophils Relative %: 56 %
Platelet Count: 155 10*3/uL (ref 150–400)
RBC: 4.84 MIL/uL (ref 4.22–5.81)
RDW: 13.5 % (ref 11.5–15.5)
WBC Count: 3.2 10*3/uL — ABNORMAL LOW (ref 4.0–10.5)
nRBC: 0.6 % — ABNORMAL HIGH (ref 0.0–0.2)

## 2022-06-07 NOTE — Telephone Encounter (Signed)
-----   Message from Benjiman Core, MD sent at 06/07/2022  1:08 PM EDT ----- Please let him know his platelets are normal. He needs to to stay on prednisone for now.

## 2022-06-07 NOTE — Telephone Encounter (Signed)
LM with note below 

## 2022-06-14 ENCOUNTER — Inpatient Hospital Stay: Payer: BC Managed Care – PPO

## 2022-06-14 ENCOUNTER — Telehealth: Payer: Self-pay | Admitting: *Deleted

## 2022-06-14 ENCOUNTER — Other Ambulatory Visit: Payer: Self-pay

## 2022-06-14 DIAGNOSIS — D696 Thrombocytopenia, unspecified: Secondary | ICD-10-CM | POA: Diagnosis not present

## 2022-06-14 DIAGNOSIS — D709 Neutropenia, unspecified: Secondary | ICD-10-CM | POA: Diagnosis not present

## 2022-06-14 LAB — CBC WITH DIFFERENTIAL (CANCER CENTER ONLY)
Abs Immature Granulocytes: 0.13 10*3/uL — ABNORMAL HIGH (ref 0.00–0.07)
Basophils Absolute: 0 10*3/uL (ref 0.0–0.1)
Basophils Relative: 0 %
Eosinophils Absolute: 0 10*3/uL (ref 0.0–0.5)
Eosinophils Relative: 0 %
HCT: 45.2 % (ref 39.0–52.0)
Hemoglobin: 15.6 g/dL (ref 13.0–17.0)
Immature Granulocytes: 1 %
Lymphocytes Relative: 8 %
Lymphs Abs: 1 10*3/uL (ref 0.7–4.0)
MCH: 30.3 pg (ref 26.0–34.0)
MCHC: 34.5 g/dL (ref 30.0–36.0)
MCV: 87.8 fL (ref 80.0–100.0)
Monocytes Absolute: 0.2 10*3/uL (ref 0.1–1.0)
Monocytes Relative: 2 %
Neutro Abs: 11.8 10*3/uL — ABNORMAL HIGH (ref 1.7–7.7)
Neutrophils Relative %: 89 %
Platelet Count: 155 10*3/uL (ref 150–400)
RBC: 5.15 MIL/uL (ref 4.22–5.81)
RDW: 13.7 % (ref 11.5–15.5)
WBC Count: 13.2 10*3/uL — ABNORMAL HIGH (ref 4.0–10.5)
nRBC: 0 % (ref 0.0–0.2)

## 2022-06-16 DIAGNOSIS — Z0000 Encounter for general adult medical examination without abnormal findings: Secondary | ICD-10-CM | POA: Diagnosis not present

## 2022-06-20 ENCOUNTER — Inpatient Hospital Stay: Payer: BC Managed Care – PPO | Attending: Oncology

## 2022-06-20 DIAGNOSIS — D696 Thrombocytopenia, unspecified: Secondary | ICD-10-CM | POA: Insufficient documentation

## 2022-06-20 DIAGNOSIS — Z862 Personal history of diseases of the blood and blood-forming organs and certain disorders involving the immune mechanism: Secondary | ICD-10-CM | POA: Diagnosis not present

## 2022-06-20 DIAGNOSIS — B351 Tinea unguium: Secondary | ICD-10-CM | POA: Diagnosis not present

## 2022-06-20 DIAGNOSIS — Z7952 Long term (current) use of systemic steroids: Secondary | ICD-10-CM | POA: Diagnosis not present

## 2022-06-20 DIAGNOSIS — Z0000 Encounter for general adult medical examination without abnormal findings: Secondary | ICD-10-CM | POA: Diagnosis not present

## 2022-06-20 LAB — CBC WITH DIFFERENTIAL (CANCER CENTER ONLY)
Abs Immature Granulocytes: 0.1 10*3/uL — ABNORMAL HIGH (ref 0.00–0.07)
Basophils Absolute: 0 10*3/uL (ref 0.0–0.1)
Basophils Relative: 0 %
Eosinophils Absolute: 0 10*3/uL (ref 0.0–0.5)
Eosinophils Relative: 0 %
HCT: 42.5 % (ref 39.0–52.0)
Hemoglobin: 14.7 g/dL (ref 13.0–17.0)
Immature Granulocytes: 1 %
Lymphocytes Relative: 6 %
Lymphs Abs: 0.8 10*3/uL (ref 0.7–4.0)
MCH: 30.3 pg (ref 26.0–34.0)
MCHC: 34.6 g/dL (ref 30.0–36.0)
MCV: 87.6 fL (ref 80.0–100.0)
Monocytes Absolute: 0.2 10*3/uL (ref 0.1–1.0)
Monocytes Relative: 2 %
Neutro Abs: 10.7 10*3/uL — ABNORMAL HIGH (ref 1.7–7.7)
Neutrophils Relative %: 91 %
Platelet Count: 117 10*3/uL — ABNORMAL LOW (ref 150–400)
RBC: 4.85 MIL/uL (ref 4.22–5.81)
RDW: 13.6 % (ref 11.5–15.5)
WBC Count: 11.7 10*3/uL — ABNORMAL HIGH (ref 4.0–10.5)
nRBC: 0 % (ref 0.0–0.2)

## 2022-06-27 ENCOUNTER — Inpatient Hospital Stay: Payer: BC Managed Care – PPO

## 2022-06-27 ENCOUNTER — Inpatient Hospital Stay (HOSPITAL_BASED_OUTPATIENT_CLINIC_OR_DEPARTMENT_OTHER): Payer: BC Managed Care – PPO | Admitting: Oncology

## 2022-06-27 VITALS — BP 113/77 | HR 74 | Temp 98.0°F | Resp 18 | Ht 67.0 in | Wt 235.1 lb

## 2022-06-27 DIAGNOSIS — D696 Thrombocytopenia, unspecified: Secondary | ICD-10-CM

## 2022-06-27 DIAGNOSIS — D709 Neutropenia, unspecified: Secondary | ICD-10-CM

## 2022-06-27 DIAGNOSIS — Z862 Personal history of diseases of the blood and blood-forming organs and certain disorders involving the immune mechanism: Secondary | ICD-10-CM | POA: Diagnosis not present

## 2022-06-27 DIAGNOSIS — Z7952 Long term (current) use of systemic steroids: Secondary | ICD-10-CM | POA: Diagnosis not present

## 2022-06-27 LAB — CBC WITH DIFFERENTIAL (CANCER CENTER ONLY)
Abs Immature Granulocytes: 0.02 10*3/uL (ref 0.00–0.07)
Basophils Absolute: 0 10*3/uL (ref 0.0–0.1)
Basophils Relative: 0 %
Eosinophils Absolute: 0 10*3/uL (ref 0.0–0.5)
Eosinophils Relative: 0 %
HCT: 41.5 % (ref 39.0–52.0)
Hemoglobin: 14.3 g/dL (ref 13.0–17.0)
Immature Granulocytes: 0 %
Lymphocytes Relative: 33 %
Lymphs Abs: 2.4 10*3/uL (ref 0.7–4.0)
MCH: 30.4 pg (ref 26.0–34.0)
MCHC: 34.5 g/dL (ref 30.0–36.0)
MCV: 88.3 fL (ref 80.0–100.0)
Monocytes Absolute: 0.5 10*3/uL (ref 0.1–1.0)
Monocytes Relative: 7 %
Neutro Abs: 4.2 10*3/uL (ref 1.7–7.7)
Neutrophils Relative %: 60 %
Platelet Count: 75 10*3/uL — ABNORMAL LOW (ref 150–400)
RBC: 4.7 MIL/uL (ref 4.22–5.81)
RDW: 13.5 % (ref 11.5–15.5)
WBC Count: 7.1 10*3/uL (ref 4.0–10.5)
nRBC: 0 % (ref 0.0–0.2)

## 2022-06-27 NOTE — Progress Notes (Signed)
Hematology and Oncology Follow Up Visit  Mark Duarte [id number] 28-Aug-1981 41 y.o. 06/27/2022 10:56 AM Dayna Ramus [place], FNP   Principle Diagnosis: 41 year old male with neutropenia and thrombocytopenia diagnosed in April 2023.  The etiology is related to autoimmune process versus postviral syndrome.      Prior Therapy:   He is status post platelet transfusion given on April 01, 2022.  He is status post a bone marrow biopsy completed on Apr 29, 2022.  The results did not show any infiltrative bone marrow process.  Current therapy: Prednisone 60 mg daily started on May 31, 2022.  He is currently on 50 mg daily.  Interim History: Mark Duarte returns today for a follow-up evaluation.  Since last visit, he reports no major changes in his health.  He tolerated prednisone well but has reported that increased activity and appetite.  He denies any bruising or bleeding issues.  He denies any cough, shortness of breath or difficulty breathing.     Medications: Updated on review. Current Outpatient Medications  Medication Sig Dispense Refill   albuterol (VENTOLIN HFA) 108 (90 Base) MCG/ACT inhaler Inhale 2 puffs into the lungs every 4 (four) hours as needed for wheezing or shortness of breath (cough, shortness of breath or wheezing.). 1 each 1   azithromycin (ZITHROMAX Z-PAK) 250 MG tablet Take 2 tablets (500 mg) on day one, then one tablet (250 mg) for the next four days 6 each 0   benzonatate (TESSALON) 100 MG capsule Take 1 capsule (100 mg total) by mouth every 8 (eight) hours. (Patient taking differently: Take 100 mg by mouth 3 (three) times daily as needed for cough.) 21 capsule 0   fexofenadine (ALLEGRA) 180 MG tablet Take 180 mg by mouth daily as needed for allergies or rhinitis.     fluticasone (FLONASE) 50 MCG/ACT nasal spray Place 2 sprays into both nostrils daily. (Patient taking differently: Place 2 sprays into both nostrils daily as needed for allergies or  rhinitis.) 1 g 0   HYDROMET 5-1.5 MG/5ML syrup Take 5 mLs by mouth at bedtime as needed for cough.     predniSONE (DELTASONE) 20 MG tablet Take 3 tablets (60 mg total) by mouth daily with breakfast. 90 tablet 1   Probiotic Product (PROBIOTIC GUMMIES) 30 MG CHEW Chew 30 g/day by mouth every morning. Taking two po daily     No current facility-administered medications for this visit.     Allergies: No Known Allergies    Physical Exam:  Blood pressure 113/77, pulse 74, temperature 98 F (36.7 C), temperature source Oral, resp. rate 18, height 5' 7" (1.702 m), weight 235 lb 1.6 oz (106.6 kg), SpO2 97 %.    ECOG: 0   General appearance: Alert, awake without any distress. Head: Atraumatic without abnormalities Oropharynx: Without any thrush or ulcers. Eyes: No scleral icterus. Lymph nodes: No lymphadenopathy noted in the cervical, supraclavicular, or axillary nodes Heart:regular rate and rhythm, without any murmurs or gallops.   Lung: Clear to auscultation without any rhonchi, wheezes or dullness to percussion. Abdomin: Soft, nontender without any shifting dullness or ascites. Musculoskeletal: No clubbing or cyanosis. Neurological: No motor or sensory deficits. Skin: No rashes or lesions.     Lab Results: Lab Results  Component Value Date   WBC 7.1 06/27/2022   HGB 14.3 06/27/2022   HCT 41.5 06/27/2022   MCV 88.3 06/27/2022   PLT 75 (L) 06/27/2022     Chemistry      Component Value Date/Time  NA 137 04/07/2022 0809   K 4.1 04/07/2022 0809   CL 105 04/07/2022 0809   CO2 26 04/07/2022 0809   BUN 20 04/07/2022 0809   CREATININE 1.02 04/07/2022 0809      Component Value Date/Time   CALCIUM 9.2 04/07/2022 0809   ALKPHOS 54 04/07/2022 0809   AST 23 04/07/2022 0809   ALT 60 (H) 04/07/2022 0809   BILITOT 0.8 04/07/2022 0809         Impression and Plan:   41 year old with:  1.  Thrombocytopenia presented in April 2023.  His evaluation did not show any  primary bone marrow disorder including a bone marrow biopsy.   Laboratory data in the last few weeks noted after he was started on prednisone.  He had a complete response with normalization of his platelet count to 155 and absolute neutrophil count that is within normal range.  His platelet count has drifted however after prednisone taper currently at 75.  These findings suggestive of autoimmune disorder at this time which could be ITP related to a postviral syndrome.  I have recommended increasing his prednisone dose to 80 mg daily given the drop in his platelet count.  I will start the prednisone taper again once his platelet normalized.  If his platelet continues to drop after prednisone taper we will use rituximab or possibly splenectomy.   2.  Neutropenia: Appears to be autoimmune in nature and has resolved.   3.  Follow-up: Weekly labs and MD follow-up in the next 3 to 4 weeks.   30  minutes were dedicated to this visit.  The time was spent on reviewing laboratory data, disease status update and outlining future plan of care discussion.    Zola Button, MD 7/10/202310:56 AM

## 2022-07-04 ENCOUNTER — Inpatient Hospital Stay: Payer: BC Managed Care – PPO

## 2022-07-04 ENCOUNTER — Telehealth: Payer: Self-pay | Admitting: *Deleted

## 2022-07-04 ENCOUNTER — Other Ambulatory Visit: Payer: Self-pay

## 2022-07-04 DIAGNOSIS — Z7952 Long term (current) use of systemic steroids: Secondary | ICD-10-CM | POA: Diagnosis not present

## 2022-07-04 DIAGNOSIS — Z862 Personal history of diseases of the blood and blood-forming organs and certain disorders involving the immune mechanism: Secondary | ICD-10-CM | POA: Diagnosis not present

## 2022-07-04 DIAGNOSIS — D696 Thrombocytopenia, unspecified: Secondary | ICD-10-CM | POA: Diagnosis not present

## 2022-07-04 LAB — CBC WITH DIFFERENTIAL (CANCER CENTER ONLY)
Abs Immature Granulocytes: 0.02 10*3/uL (ref 0.00–0.07)
Basophils Absolute: 0 10*3/uL (ref 0.0–0.1)
Basophils Relative: 0 %
Eosinophils Absolute: 0 10*3/uL (ref 0.0–0.5)
Eosinophils Relative: 0 %
HCT: 41.2 % (ref 39.0–52.0)
Hemoglobin: 14.3 g/dL (ref 13.0–17.0)
Immature Granulocytes: 0 %
Lymphocytes Relative: 13 %
Lymphs Abs: 0.8 10*3/uL (ref 0.7–4.0)
MCH: 30.3 pg (ref 26.0–34.0)
MCHC: 34.7 g/dL (ref 30.0–36.0)
MCV: 87.3 fL (ref 80.0–100.0)
Monocytes Absolute: 0.2 10*3/uL (ref 0.1–1.0)
Monocytes Relative: 3 %
Neutro Abs: 5.4 10*3/uL (ref 1.7–7.7)
Neutrophils Relative %: 84 %
Platelet Count: 126 10*3/uL — ABNORMAL LOW (ref 150–400)
RBC: 4.72 MIL/uL (ref 4.22–5.81)
RDW: 13.4 % (ref 11.5–15.5)
WBC Count: 6.5 10*3/uL (ref 4.0–10.5)
nRBC: 0 % (ref 0.0–0.2)

## 2022-07-04 NOTE — Telephone Encounter (Signed)
PC to patient, informed him of Dr. Shadad's instructions as below, he verbalizes understanding. 

## 2022-07-04 NOTE — Telephone Encounter (Signed)
-----   Message from Benjiman Core, MD sent at 07/04/2022 11:32 AM EDT ----- Please let him know to go down to Prednisone 70 mg starting 7/24.

## 2022-07-11 ENCOUNTER — Other Ambulatory Visit: Payer: Self-pay

## 2022-07-11 ENCOUNTER — Inpatient Hospital Stay: Payer: BC Managed Care – PPO

## 2022-07-11 DIAGNOSIS — D696 Thrombocytopenia, unspecified: Secondary | ICD-10-CM | POA: Diagnosis not present

## 2022-07-11 DIAGNOSIS — Z862 Personal history of diseases of the blood and blood-forming organs and certain disorders involving the immune mechanism: Secondary | ICD-10-CM | POA: Diagnosis not present

## 2022-07-11 DIAGNOSIS — Z7952 Long term (current) use of systemic steroids: Secondary | ICD-10-CM | POA: Diagnosis not present

## 2022-07-11 LAB — CBC WITH DIFFERENTIAL (CANCER CENTER ONLY)
Abs Immature Granulocytes: 0.06 10*3/uL (ref 0.00–0.07)
Basophils Absolute: 0 10*3/uL (ref 0.0–0.1)
Basophils Relative: 0 %
Eosinophils Absolute: 0 10*3/uL (ref 0.0–0.5)
Eosinophils Relative: 0 %
HCT: 42.7 % (ref 39.0–52.0)
Hemoglobin: 14.6 g/dL (ref 13.0–17.0)
Immature Granulocytes: 1 %
Lymphocytes Relative: 13 %
Lymphs Abs: 1.2 10*3/uL (ref 0.7–4.0)
MCH: 30 pg (ref 26.0–34.0)
MCHC: 34.2 g/dL (ref 30.0–36.0)
MCV: 87.9 fL (ref 80.0–100.0)
Monocytes Absolute: 0.3 10*3/uL (ref 0.1–1.0)
Monocytes Relative: 3 %
Neutro Abs: 7.5 10*3/uL (ref 1.7–7.7)
Neutrophils Relative %: 83 %
Platelet Count: 114 10*3/uL — ABNORMAL LOW (ref 150–400)
RBC: 4.86 MIL/uL (ref 4.22–5.81)
RDW: 13.3 % (ref 11.5–15.5)
WBC Count: 9 10*3/uL (ref 4.0–10.5)
nRBC: 0 % (ref 0.0–0.2)

## 2022-07-18 ENCOUNTER — Other Ambulatory Visit: Payer: Self-pay

## 2022-07-18 ENCOUNTER — Inpatient Hospital Stay: Payer: BC Managed Care – PPO

## 2022-07-18 DIAGNOSIS — D696 Thrombocytopenia, unspecified: Secondary | ICD-10-CM | POA: Diagnosis not present

## 2022-07-18 DIAGNOSIS — Z862 Personal history of diseases of the blood and blood-forming organs and certain disorders involving the immune mechanism: Secondary | ICD-10-CM | POA: Diagnosis not present

## 2022-07-18 DIAGNOSIS — Z7952 Long term (current) use of systemic steroids: Secondary | ICD-10-CM | POA: Diagnosis not present

## 2022-07-18 LAB — CBC WITH DIFFERENTIAL (CANCER CENTER ONLY)
Abs Immature Granulocytes: 0.08 K/uL — ABNORMAL HIGH (ref 0.00–0.07)
Basophils Absolute: 0 K/uL (ref 0.0–0.1)
Basophils Relative: 0 %
Eosinophils Absolute: 0 K/uL (ref 0.0–0.5)
Eosinophils Relative: 0 %
HCT: 43.3 % (ref 39.0–52.0)
Hemoglobin: 14.8 g/dL (ref 13.0–17.0)
Immature Granulocytes: 1 %
Lymphocytes Relative: 10 %
Lymphs Abs: 0.9 K/uL (ref 0.7–4.0)
MCH: 30.1 pg (ref 26.0–34.0)
MCHC: 34.2 g/dL (ref 30.0–36.0)
MCV: 88.2 fL (ref 80.0–100.0)
Monocytes Absolute: 0.3 K/uL (ref 0.1–1.0)
Monocytes Relative: 3 %
Neutro Abs: 8.4 K/uL — ABNORMAL HIGH (ref 1.7–7.7)
Neutrophils Relative %: 86 %
Platelet Count: 139 K/uL — ABNORMAL LOW (ref 150–400)
RBC: 4.91 MIL/uL (ref 4.22–5.81)
RDW: 13.5 % (ref 11.5–15.5)
WBC Count: 9.7 K/uL (ref 4.0–10.5)
nRBC: 0 % (ref 0.0–0.2)

## 2022-07-19 ENCOUNTER — Telehealth: Payer: Self-pay | Admitting: *Deleted

## 2022-07-19 NOTE — Telephone Encounter (Signed)
-----   Message from Benjiman Core, MD sent at 07/19/2022  8:18 AM EDT ----- Please let him know his platelets are still good. He can drop to 60 mg starting today

## 2022-07-19 NOTE — Telephone Encounter (Signed)
PC to patient, informed him of Dr Shadad's message below, he verbalizes understanding. 

## 2022-07-25 ENCOUNTER — Inpatient Hospital Stay: Payer: BC Managed Care – PPO | Attending: Oncology

## 2022-07-25 ENCOUNTER — Other Ambulatory Visit: Payer: Self-pay

## 2022-07-25 DIAGNOSIS — D696 Thrombocytopenia, unspecified: Secondary | ICD-10-CM

## 2022-07-25 DIAGNOSIS — D8989 Other specified disorders involving the immune mechanism, not elsewhere classified: Secondary | ICD-10-CM | POA: Diagnosis not present

## 2022-07-25 DIAGNOSIS — D6959 Other secondary thrombocytopenia: Secondary | ICD-10-CM | POA: Insufficient documentation

## 2022-07-25 LAB — CBC WITH DIFFERENTIAL (CANCER CENTER ONLY)
Abs Immature Granulocytes: 0.07 10*3/uL (ref 0.00–0.07)
Basophils Absolute: 0 10*3/uL (ref 0.0–0.1)
Basophils Relative: 0 %
Eosinophils Absolute: 0 10*3/uL (ref 0.0–0.5)
Eosinophils Relative: 0 %
HCT: 42.6 % (ref 39.0–52.0)
Hemoglobin: 14.9 g/dL (ref 13.0–17.0)
Immature Granulocytes: 1 %
Lymphocytes Relative: 10 %
Lymphs Abs: 1 10*3/uL (ref 0.7–4.0)
MCH: 30.3 pg (ref 26.0–34.0)
MCHC: 35 g/dL (ref 30.0–36.0)
MCV: 86.6 fL (ref 80.0–100.0)
Monocytes Absolute: 0.3 10*3/uL (ref 0.1–1.0)
Monocytes Relative: 3 %
Neutro Abs: 8.8 10*3/uL — ABNORMAL HIGH (ref 1.7–7.7)
Neutrophils Relative %: 86 %
Platelet Count: 135 10*3/uL — ABNORMAL LOW (ref 150–400)
RBC: 4.92 MIL/uL (ref 4.22–5.81)
RDW: 13.4 % (ref 11.5–15.5)
WBC Count: 10.1 10*3/uL (ref 4.0–10.5)
nRBC: 0 % (ref 0.0–0.2)

## 2022-07-25 MED ORDER — PREDNISONE 20 MG PO TABS: 50.0000 mg | ORAL_TABLET | Freq: Every day | ORAL | 1 refills | Status: DC

## 2022-07-25 NOTE — Telephone Encounter (Signed)
Contacted patient and made him aware to change prednisone dose to 50 mg daily and new prescription was sent to pharmacy. Patient verbalized understanding and had no questions or concerns.

## 2022-08-01 ENCOUNTER — Other Ambulatory Visit: Payer: Self-pay

## 2022-08-01 ENCOUNTER — Inpatient Hospital Stay: Payer: BC Managed Care – PPO | Admitting: Oncology

## 2022-08-01 ENCOUNTER — Telehealth: Payer: Self-pay | Admitting: *Deleted

## 2022-08-01 ENCOUNTER — Inpatient Hospital Stay: Payer: BC Managed Care – PPO

## 2022-08-01 DIAGNOSIS — D696 Thrombocytopenia, unspecified: Secondary | ICD-10-CM

## 2022-08-01 DIAGNOSIS — D6959 Other secondary thrombocytopenia: Secondary | ICD-10-CM | POA: Diagnosis not present

## 2022-08-01 DIAGNOSIS — D8989 Other specified disorders involving the immune mechanism, not elsewhere classified: Secondary | ICD-10-CM | POA: Diagnosis not present

## 2022-08-01 LAB — CBC WITH DIFFERENTIAL (CANCER CENTER ONLY)
Abs Immature Granulocytes: 0.04 10*3/uL (ref 0.00–0.07)
Basophils Absolute: 0 10*3/uL (ref 0.0–0.1)
Basophils Relative: 0 %
Eosinophils Absolute: 0 10*3/uL (ref 0.0–0.5)
Eosinophils Relative: 0 %
HCT: 40.6 % (ref 39.0–52.0)
Hemoglobin: 14 g/dL (ref 13.0–17.0)
Immature Granulocytes: 1 %
Lymphocytes Relative: 19 %
Lymphs Abs: 1.6 10*3/uL (ref 0.7–4.0)
MCH: 30.4 pg (ref 26.0–34.0)
MCHC: 34.5 g/dL (ref 30.0–36.0)
MCV: 88.1 fL (ref 80.0–100.0)
Monocytes Absolute: 0.5 10*3/uL (ref 0.1–1.0)
Monocytes Relative: 6 %
Neutro Abs: 6.4 10*3/uL (ref 1.7–7.7)
Neutrophils Relative %: 74 %
Platelet Count: 128 10*3/uL — ABNORMAL LOW (ref 150–400)
RBC: 4.61 MIL/uL (ref 4.22–5.81)
RDW: 13.7 % (ref 11.5–15.5)
WBC Count: 8.6 10*3/uL (ref 4.0–10.5)
nRBC: 0 % (ref 0.0–0.2)

## 2022-08-01 NOTE — Progress Notes (Signed)
Hematology and Oncology Follow Up Visit  Mark Duarte [id number] 1980/12/28 41 y.o. 08/01/2022 11:34 AM Mark Ramus [place], FNP   Principle Diagnosis: 41 year old male with thrombocytopenia related to autoimmune process diagnosed in April 2023.  Etiology could be related to postviral infection but cause neutropenia as well.     Prior Therapy:   He is status post platelet transfusion given on April 01, 2022.  He is status post a bone marrow biopsy completed on Apr 29, 2022.  The results did not show any infiltrative bone marrow process.  Current therapy: Prednisone 60 mg daily started on May 31, 2022.  His dose was increased to 80 mg and subsequently tapered down to  Interim History: Mark Duarte returns today for a follow-up.  Since last visit,     Medications: Reviewed without changes. Current Outpatient Medications  Medication Sig Dispense Refill  . albuterol (VENTOLIN HFA) 108 (90 Base) MCG/ACT inhaler Inhale 2 puffs into the lungs every 4 (four) hours as needed for wheezing or shortness of breath (cough, shortness of breath or wheezing.). 1 each 1  . azithromycin (ZITHROMAX Z-PAK) 250 MG tablet Take 2 tablets (500 mg) on day one, then one tablet (250 mg) for the next four days 6 each 0  . benzonatate (TESSALON) 100 MG capsule Take 1 capsule (100 mg total) by mouth every 8 (eight) hours. (Patient taking differently: Take 100 mg by mouth 3 (three) times daily as needed for cough.) 21 capsule 0  . fexofenadine (ALLEGRA) 180 MG tablet Take 180 mg by mouth daily as needed for allergies or rhinitis.    . fluticasone (FLONASE) 50 MCG/ACT nasal spray Place 2 sprays into both nostrils daily. (Patient taking differently: Place 2 sprays into both nostrils daily as needed for allergies or rhinitis.) 1 g 0  . HYDROMET 5-1.5 MG/5ML syrup Take 5 mLs by mouth at bedtime as needed for cough.    . predniSONE (DELTASONE) 20 MG tablet Take 2.5 tablets (50 mg total) by mouth daily  with breakfast. 90 tablet 1  . Probiotic Product (PROBIOTIC GUMMIES) 30 MG CHEW Chew 30 g/day by mouth every morning. Taking two po daily     No current facility-administered medications for this visit.     Allergies: No Known Allergies    Physical Exam:      ECOG: 0    General appearance: Comfortable appearing without any discomfort Head: Normocephalic without any trauma Oropharynx: Mucous membranes are moist and pink without any thrush or ulcers. Eyes: Pupils are equal and round reactive to light. Lymph nodes: No cervical, supraclavicular, inguinal or axillary lymphadenopathy.   Heart:regular rate and rhythm.  S1 and S2 without leg edema. Lung: Clear without any rhonchi or wheezes.  No dullness to percussion. Abdomin: Soft, nontender, nondistended with good bowel sounds.  No hepatosplenomegaly. Musculoskeletal: No joint deformity or effusion.  Full range of motion noted. Neurological: No deficits noted on motor, sensory and deep tendon reflex exam. Skin: No petechial rash or dryness.  Appeared moist.      Lab Results: Lab Results  Component Value Date   WBC 8.6 08/01/2022   HGB 14.0 08/01/2022   HCT 40.6 08/01/2022   MCV 88.1 08/01/2022   PLT 128 (L) 08/01/2022     Chemistry      Component Value Date/Time   NA 137 04/07/2022 0809   K 4.1 04/07/2022 0809   CL 105 04/07/2022 0809   CO2 26 04/07/2022 0809   BUN 20 04/07/2022 0809  CREATININE 1.02 04/07/2022 0809      Component Value Date/Time   CALCIUM 9.2 04/07/2022 0809   ALKPHOS 54 04/07/2022 0809   AST 23 04/07/2022 0809   ALT 60 (H) 04/07/2022 0809   BILITOT 0.8 04/07/2022 0809         Impression and Plan:   41 year old with:  1.  Thrombocytopenia related to her autoimmune etiology after viral infection diagnosed in April 2023.  Bone marrow biopsy did not show any hematological disorder.   He is currently on prednisone taper with overall stabilization of his counts by dropping 10 mg a  week.  2.  Neutropenia: Appears to be autoimmune in nature and has resolved.   3.  Follow-up: We will continue to have weekly labs and return follow-up in 4 weeks.   30  minutes were spent on this visit.  The time was dedicated to reviewing laboratory data, disease status update and outlining future plan of care discussion.    Mark Button, MD 8/14/202311:34 AM This encounter was created in error - please disregard.

## 2022-08-01 NOTE — Telephone Encounter (Signed)
PC to patient, informed of Dr Alver Fisher message:  His platelets continue to be adequate.  Please let him know to decrease his prednisone by 10 mg.  We will continue weekly labs I will see him in the next 3 to 4 weeks.  I will send a message to scheduling.  Patient verbalizes understanding.

## 2022-08-11 ENCOUNTER — Telehealth: Payer: Self-pay

## 2022-08-11 ENCOUNTER — Other Ambulatory Visit: Payer: Self-pay

## 2022-08-11 ENCOUNTER — Inpatient Hospital Stay: Payer: BC Managed Care – PPO

## 2022-08-11 DIAGNOSIS — D6959 Other secondary thrombocytopenia: Secondary | ICD-10-CM | POA: Diagnosis not present

## 2022-08-11 DIAGNOSIS — D696 Thrombocytopenia, unspecified: Secondary | ICD-10-CM

## 2022-08-11 DIAGNOSIS — D8989 Other specified disorders involving the immune mechanism, not elsewhere classified: Secondary | ICD-10-CM | POA: Diagnosis not present

## 2022-08-11 LAB — CBC WITH DIFFERENTIAL (CANCER CENTER ONLY)
Abs Immature Granulocytes: 0.08 10*3/uL — ABNORMAL HIGH (ref 0.00–0.07)
Basophils Absolute: 0 10*3/uL (ref 0.0–0.1)
Basophils Relative: 0 %
Eosinophils Absolute: 0 10*3/uL (ref 0.0–0.5)
Eosinophils Relative: 0 %
HCT: 40.3 % (ref 39.0–52.0)
Hemoglobin: 14.1 g/dL (ref 13.0–17.0)
Immature Granulocytes: 1 %
Lymphocytes Relative: 16 %
Lymphs Abs: 1.4 10*3/uL (ref 0.7–4.0)
MCH: 30.5 pg (ref 26.0–34.0)
MCHC: 35 g/dL (ref 30.0–36.0)
MCV: 87.2 fL (ref 80.0–100.0)
Monocytes Absolute: 0.5 10*3/uL (ref 0.1–1.0)
Monocytes Relative: 5 %
Neutro Abs: 6.6 10*3/uL (ref 1.7–7.7)
Neutrophils Relative %: 78 %
Platelet Count: 143 10*3/uL — ABNORMAL LOW (ref 150–400)
RBC: 4.62 MIL/uL (ref 4.22–5.81)
RDW: 13.8 % (ref 11.5–15.5)
WBC Count: 8.6 10*3/uL (ref 4.0–10.5)
nRBC: 0 % (ref 0.0–0.2)

## 2022-08-11 NOTE — Telephone Encounter (Addendum)
Spoke with pt to advise of message below. Patient would like early AM apt for lab draw on 08/18/22.  ----- Message from Benjiman Core, MD sent at 08/11/2022 10:37 AM EDT ----- Please let him know his platelets are good. He is to continue taper of prednisone like instructed.

## 2022-08-18 ENCOUNTER — Inpatient Hospital Stay: Payer: BC Managed Care – PPO

## 2022-08-18 ENCOUNTER — Telehealth: Payer: Self-pay

## 2022-08-18 ENCOUNTER — Other Ambulatory Visit: Payer: Self-pay

## 2022-08-18 DIAGNOSIS — D696 Thrombocytopenia, unspecified: Secondary | ICD-10-CM

## 2022-08-18 DIAGNOSIS — D8989 Other specified disorders involving the immune mechanism, not elsewhere classified: Secondary | ICD-10-CM | POA: Diagnosis not present

## 2022-08-18 DIAGNOSIS — D6959 Other secondary thrombocytopenia: Secondary | ICD-10-CM | POA: Diagnosis not present

## 2022-08-18 LAB — CBC WITH DIFFERENTIAL (CANCER CENTER ONLY)
Abs Immature Granulocytes: 0.03 10*3/uL (ref 0.00–0.07)
Basophils Absolute: 0 10*3/uL (ref 0.0–0.1)
Basophils Relative: 0 %
Eosinophils Absolute: 0 10*3/uL (ref 0.0–0.5)
Eosinophils Relative: 0 %
HCT: 40.8 % (ref 39.0–52.0)
Hemoglobin: 14.3 g/dL (ref 13.0–17.0)
Immature Granulocytes: 0 %
Lymphocytes Relative: 44 %
Lymphs Abs: 3.6 10*3/uL (ref 0.7–4.0)
MCH: 30.4 pg (ref 26.0–34.0)
MCHC: 35 g/dL (ref 30.0–36.0)
MCV: 86.8 fL (ref 80.0–100.0)
Monocytes Absolute: 0.8 10*3/uL (ref 0.1–1.0)
Monocytes Relative: 9 %
Neutro Abs: 3.8 10*3/uL (ref 1.7–7.7)
Neutrophils Relative %: 47 %
Platelet Count: 152 10*3/uL (ref 150–400)
RBC: 4.7 MIL/uL (ref 4.22–5.81)
RDW: 13.9 % (ref 11.5–15.5)
WBC Count: 8.1 10*3/uL (ref 4.0–10.5)
nRBC: 0 % (ref 0.0–0.2)

## 2022-08-18 NOTE — Telephone Encounter (Signed)
I have spoken with the pt and advised as indicated. Pt expressed understanding of this information.  

## 2022-08-18 NOTE — Telephone Encounter (Signed)
-----   Message from Benjiman Core, MD sent at 08/18/2022  9:28 AM EDT ----- Please let him know his platelets are normal. He is to continue prednisone taper as instructed

## 2022-08-23 DIAGNOSIS — D709 Neutropenia, unspecified: Secondary | ICD-10-CM | POA: Diagnosis not present

## 2022-08-23 DIAGNOSIS — D696 Thrombocytopenia, unspecified: Secondary | ICD-10-CM | POA: Diagnosis not present

## 2022-08-23 DIAGNOSIS — Z6838 Body mass index (BMI) 38.0-38.9, adult: Secondary | ICD-10-CM | POA: Diagnosis not present

## 2022-08-24 IMAGING — CT CT ANGIO CHEST-ABD-PELV FOR DISSECTION W/ AND WO/W CM
2 of 7 series · 11 of 36 positions shown, 15 images · non-contrast
Comparison: None.

CLINICAL DATA: Severe thrombocytopenia with spontaneous bruising
and petechiae. Also with severe neutropenia. Reported occasional
hemoptysis, L flank pain and brusing, atraumatic

EXAM:
CT ANGIOGRAPHY CHEST, ABDOMEN AND PELVIS
TECHNIQUE: Non-contrast CT of the chest was initially obtained.

[Series 7: axial arterial · axial · arterial · 0.90mm/px · z∈[-899,-311]mm · 10 of 230 slices shown, 13 images]
[im 17/230  mediastinal]
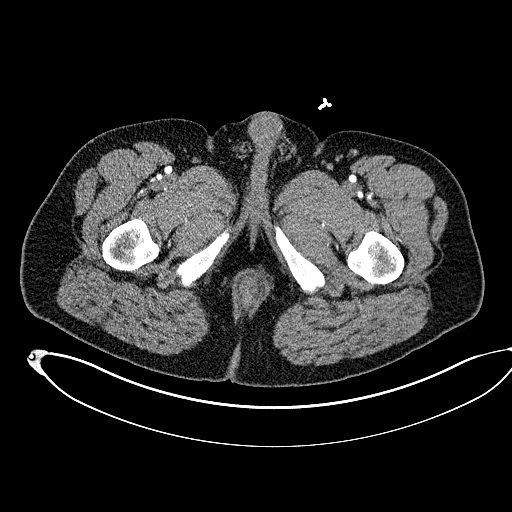
[im 17/230  bone]
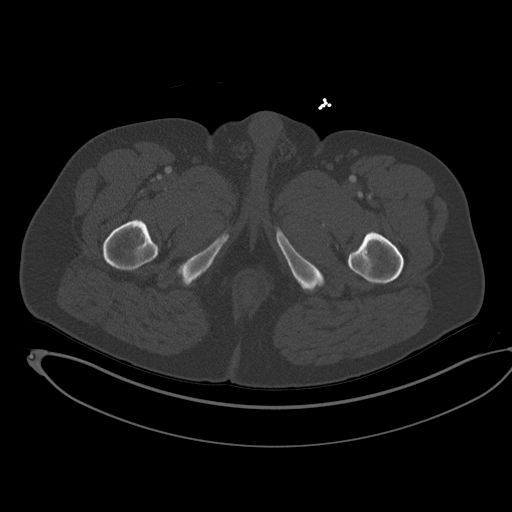
[im 50/230  mediastinal]
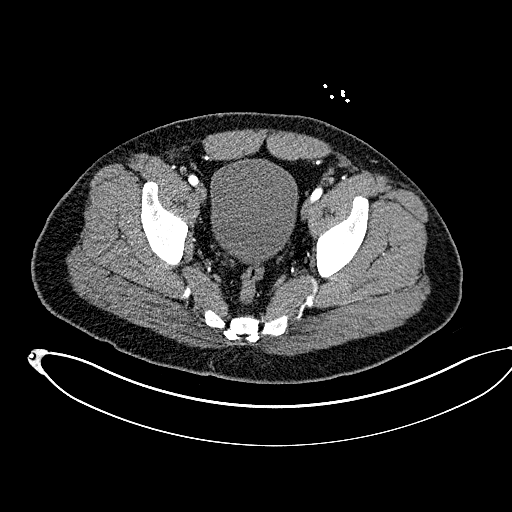
[im 82/230  mediastinal]
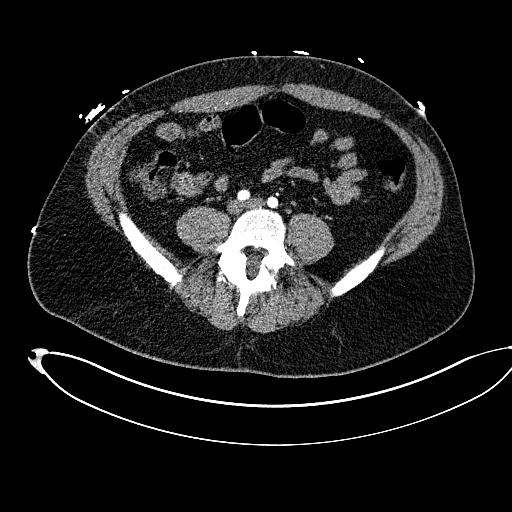
[im 99/230  mediastinal]
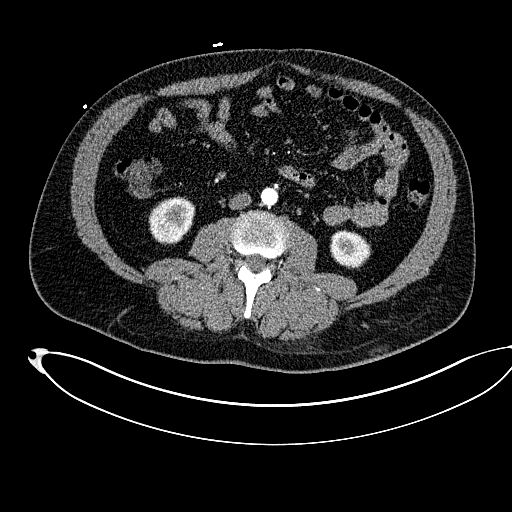
[im 131/230  mediastinal]
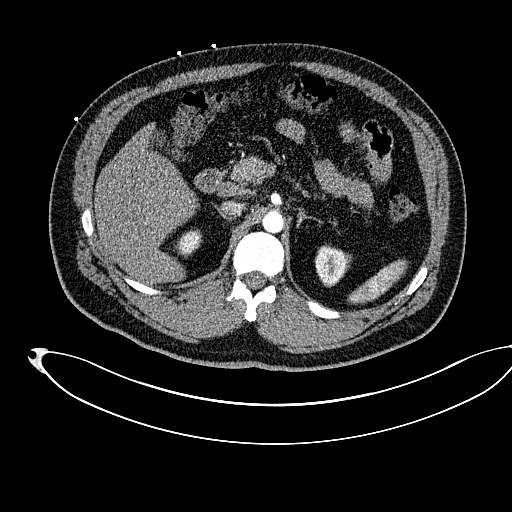
[im 148/230  mediastinal]
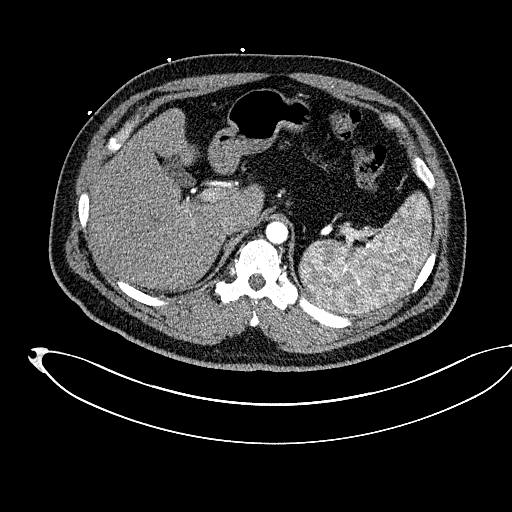
[im 164/230  lung]
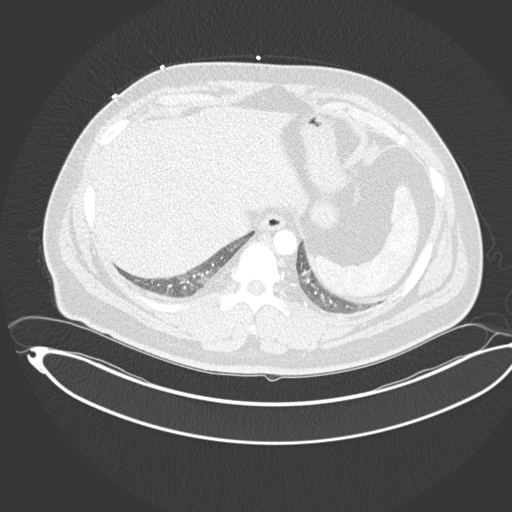
[im 180/230  mediastinal]
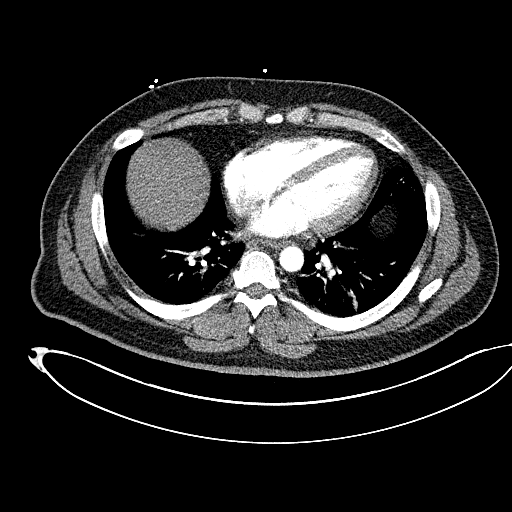
[im 180/230  lung]
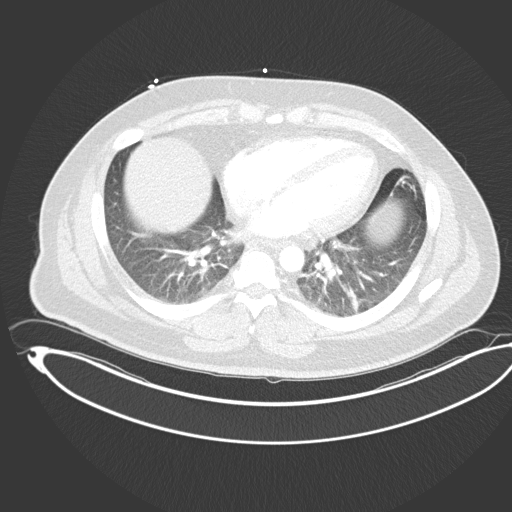
[im 197/230  lung]
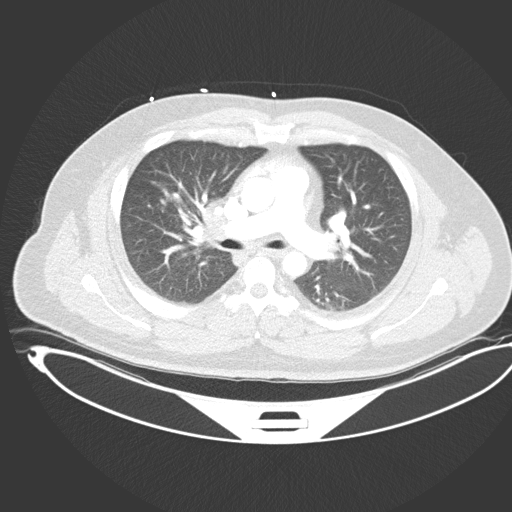
[im 213/230  mediastinal]
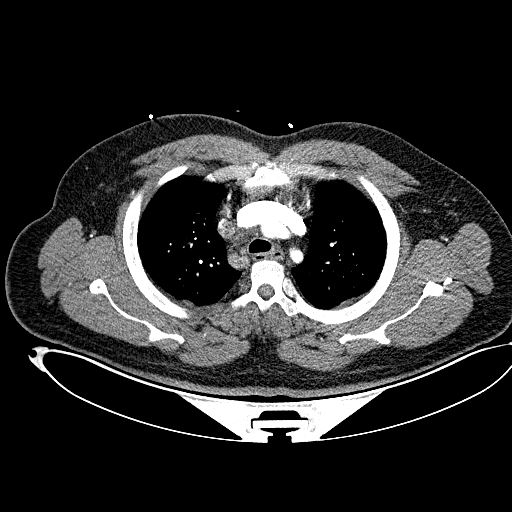
[im 213/230  lung]
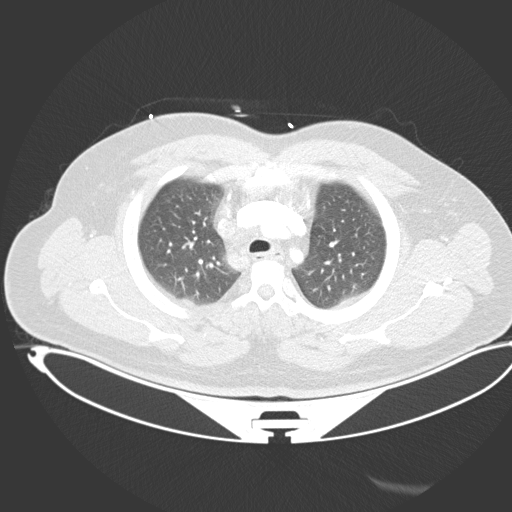

[Series 8: coronals · coronal · 0.96mm/px · 1 of 170 slices shown, 2 images]
[im 85/170  mediastinal]
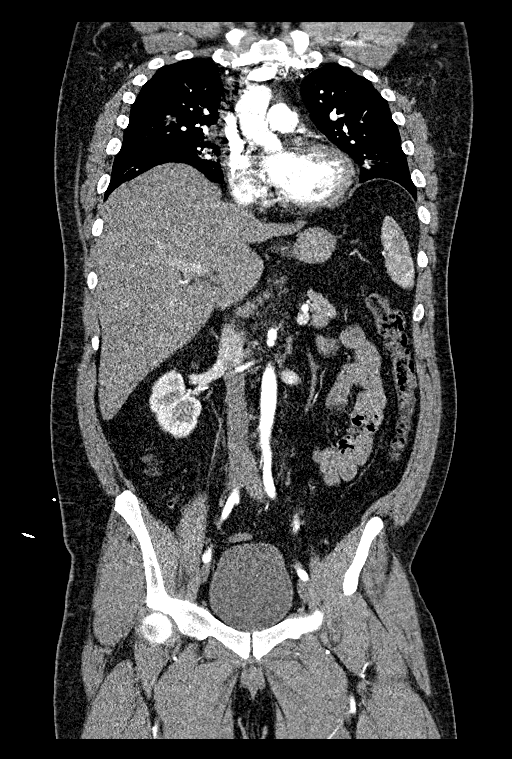
[im 85/170  bone]
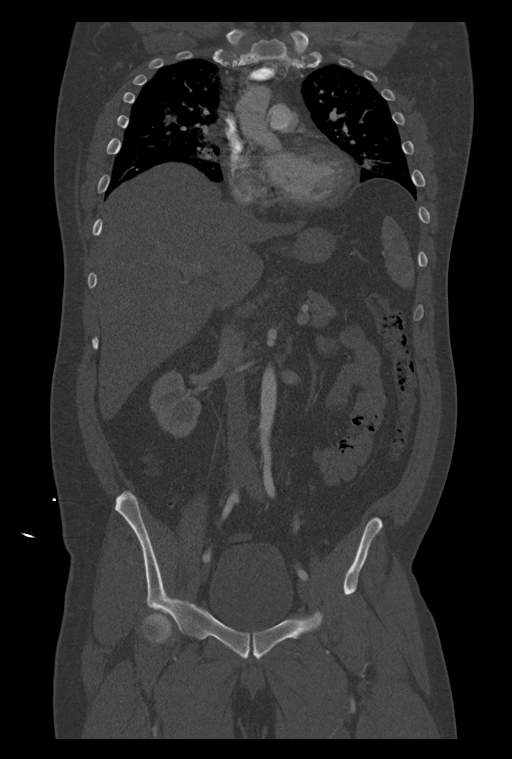

[11 of 36 positions shown; findings below may reference images not displayed]

Multidetector CT imaging through the chest, abdomen and pelvis was
performed using the standard protocol during bolus administration of
intravenous contrast. Multiplanar reconstructed images and MIPs were
obtained and reviewed to evaluate the vascular anatomy.

RADIATION DOSE REDUCTION: This exam was performed according to the
departmental dose-optimization program which includes automated
exposure control, adjustment of the mA and/or kV according to
patient size and/or use of iterative reconstruction technique.

CONTRAST:  100mL OMNIPAQUE IOHEXOL 350 MG/ML SOLN
FINDINGS: CTA CHEST FINDINGS

Cardiovascular: Preferential opacification of the thoracic aorta. No
evidence of thoracic aortic aneurysm or dissection. Normal heart
size. No pericardial effusion.

Mediastinum/Nodes: There is pathologic right paratracheal and right
hilar adenopathy with the index lymph node measuring 16 mm in short
axis diameter within the right hilum at axial image # 40/7.
Visualized thyroid is unremarkable. Esophagus is unremarkable.

Lungs/Pleura: There are scattered areas of subsegmental atelectasis
within the lung bases bilaterally,. Focal pulmonary infiltrate is
noted within the right upper lobe. There is asymmetric bronchial
wall thickening, more severe within the right lung in keeping with
airway inflammation. Focal scarring within the right middle lobe
with volume loss, pleural retraction, and traction bronchiolectasis
is noted. No central obstructing lesion. No pneumothorax or pleural
effusion.

Musculoskeletal: No acute bone abnormality peer

Review of the MIP images confirms the above findings.

CTA ABDOMEN AND PELVIS FINDINGS

VASCULAR

Aorta: Normal caliber aorta without aneurysm, dissection, vasculitis
or significant stenosis.

Celiac: Patent without evidence of aneurysm, dissection, vasculitis
or significant stenosis.

SMA: Patent without evidence of aneurysm, dissection, vasculitis or
significant stenosis.

Renals: Both renal arteries are patent without evidence of aneurysm,
dissection, vasculitis, fibromuscular dysplasia or significant
stenosis.

IMA: Patent without evidence of aneurysm, dissection, vasculitis or
significant stenosis.

Inflow: Patent without evidence of aneurysm, dissection, vasculitis
or significant stenosis.

Veins: No obvious venous abnormality within the limitations of this
arterial phase study.

Review of the MIP images confirms the above findings.

NON-VASCULAR

Hepatobiliary: No focal liver abnormality is seen. No gallstones,
gallbladder wall thickening, or biliary dilatation.

Pancreas: Unremarkable

Spleen: Normal in size without focal abnormality.

Adrenals/Urinary Tract: Adrenal glands are unremarkable. Kidneys are
normal, without renal calculi, focal lesion, or hydronephrosis.
Bladder is unremarkable.

Stomach/Bowel: Mild cecal diverticulosis without superimposed acute
inflammatory change. The stomach, small bowel, and large bowel are
otherwise unremarkable. Appendix normal. No free intraperitoneal gas
or fluid.

Lymphatic: No pathologic adenopathy within the abdomen and pelvis.

Reproductive: Prostate is unremarkable.

Other: No abdominal wall hernia.

Musculoskeletal: Bilateral L5 pars defects are present with grade 1
anterolisthesis L5-S1. Superimposed degenerative changes are noted
at this level. No acute bone abnormality. No lytic or blastic bone
lesion identified.

Review of the MIP images confirms the above findings.
IMPRESSION: Scattered areas of subsegmental atelectasis, airway inflammation,
and focal nodular infiltrate within the right upper lobe, in keeping
with atypical infection in the appropriate clinical setting.
Pathologic right paratracheal and right hilar adenopathy may be
reactive in nature. Follow-up examination in 3 months would be
helpful in documenting resolution once the patient's acute issues
have resolved.

Mild cecal diverticulosis.

Bilateral L5 pars defects with associated grade 1 anterolisthesis.

## 2022-08-29 ENCOUNTER — Inpatient Hospital Stay: Payer: BC Managed Care – PPO | Admitting: Oncology

## 2022-08-29 ENCOUNTER — Inpatient Hospital Stay: Payer: BC Managed Care – PPO

## 2022-09-05 ENCOUNTER — Inpatient Hospital Stay: Payer: BC Managed Care – PPO

## 2022-09-05 ENCOUNTER — Inpatient Hospital Stay: Payer: BC Managed Care – PPO | Admitting: Oncology

## 2022-09-21 IMAGING — CT CT BIOPSY
1 of 2 series · 15 of 31 positions shown, 19 images · non-contrast
Comparison: none

INDICATION: 40-year-old male with history of neutropenia and thrombocytopenia.

[Series 2: i-spiral 5.0 br40 · axial · 0.93mm/px · z∈[+1032,+1194]mm · 15 of 51 slices shown, 19 images]
[im 3/51  mediastinal]
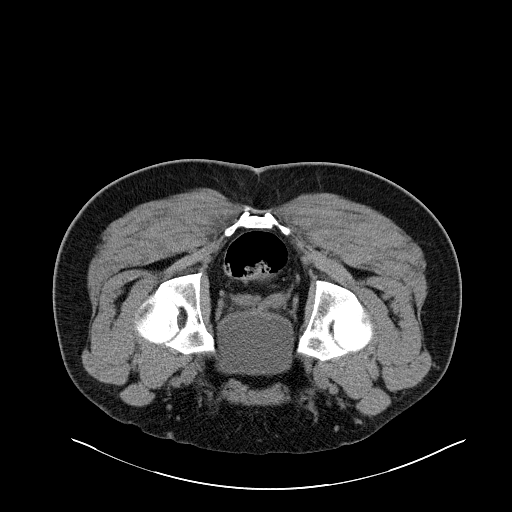
[im 3/51  lung]
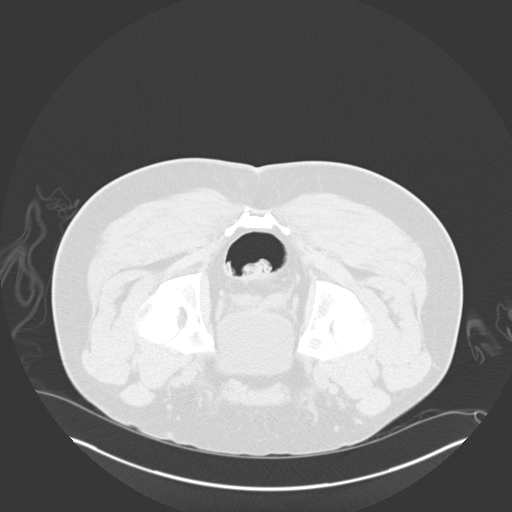
[im 7/51  lung]
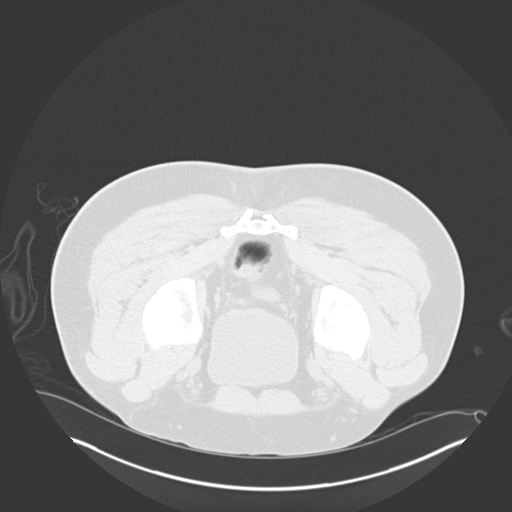
[im 11/51  lung]
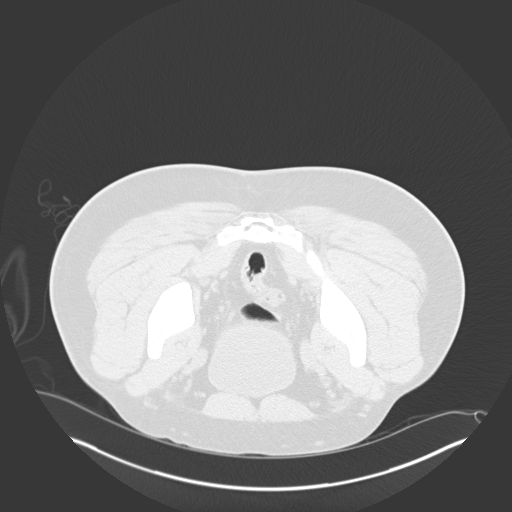
[im 15/51  lung]
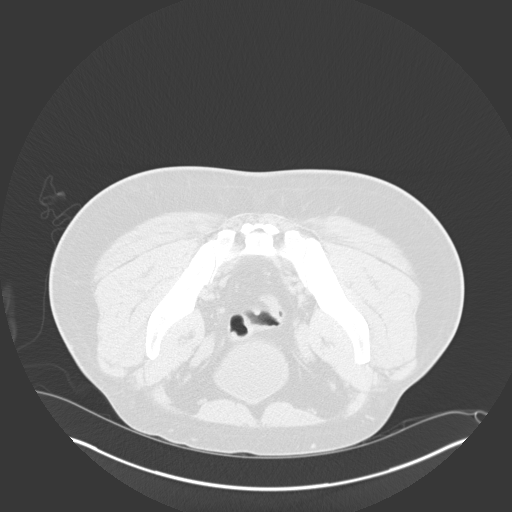
[im 19/51  mediastinal]
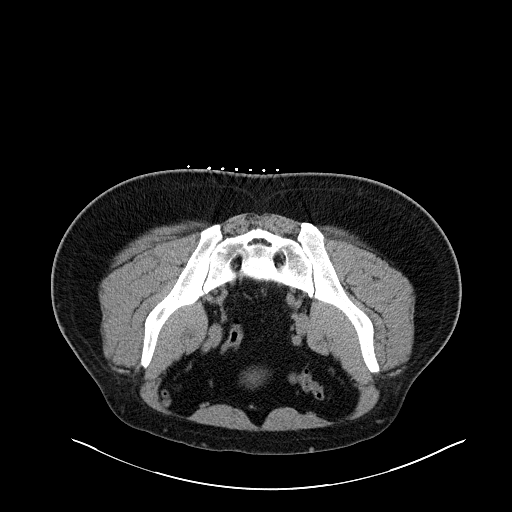
[im 19/51  lung]
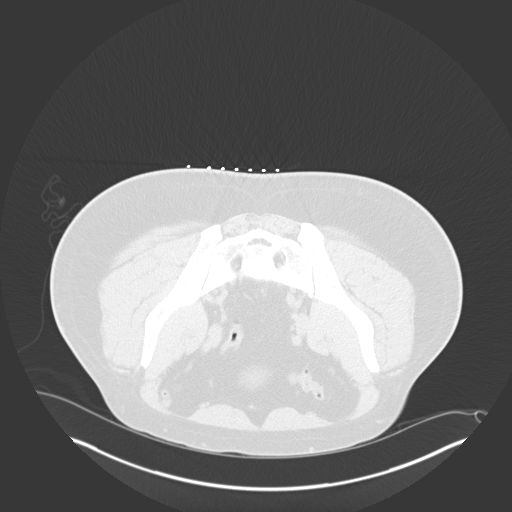
[im 21/51  lung]
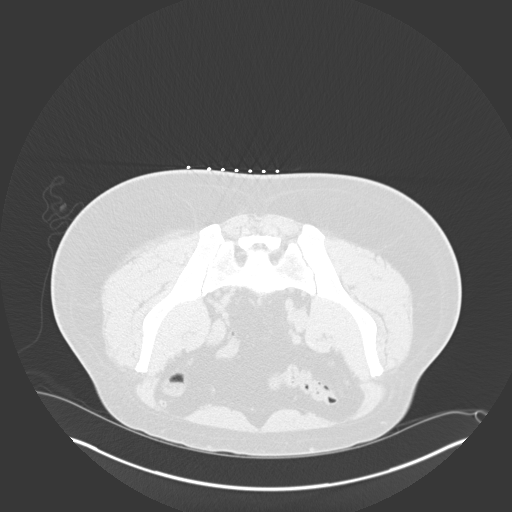
[im 24/51  lung]
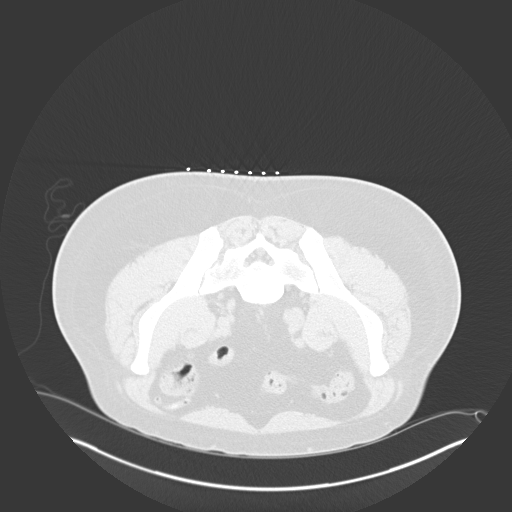
[im 26/51  lung]
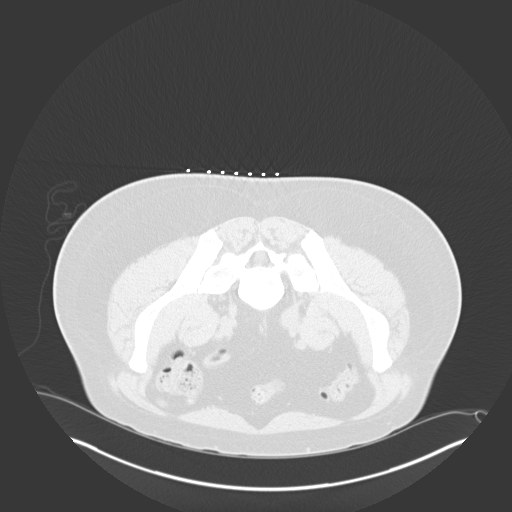
[im 29/51  mediastinal]
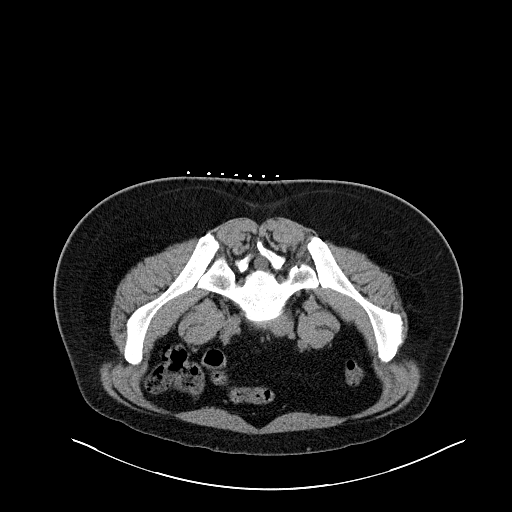
[im 29/51  lung]
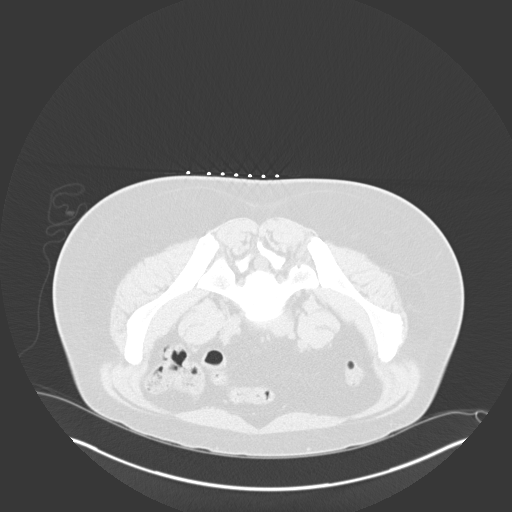
[im 33/51  lung]
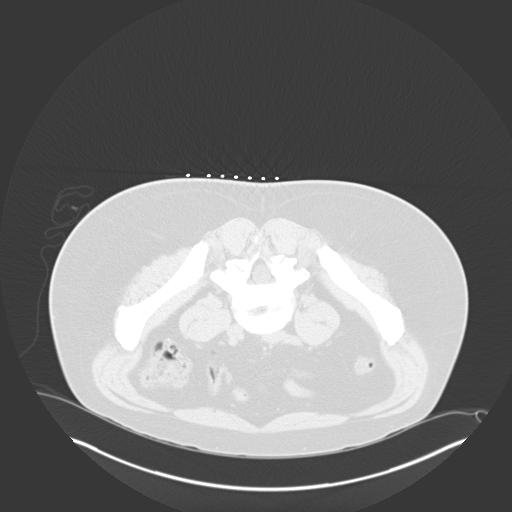
[im 35/51  lung]
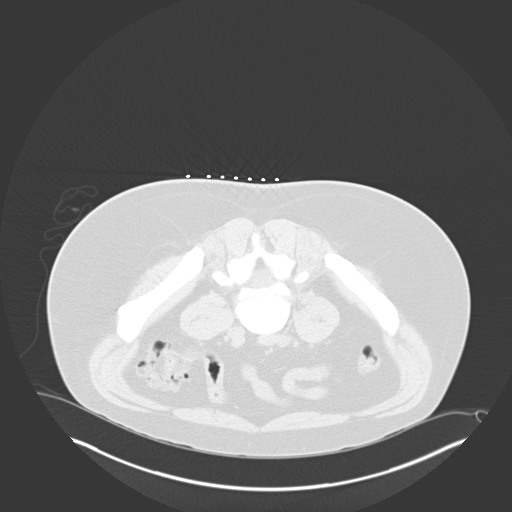
[im 38/51  lung]
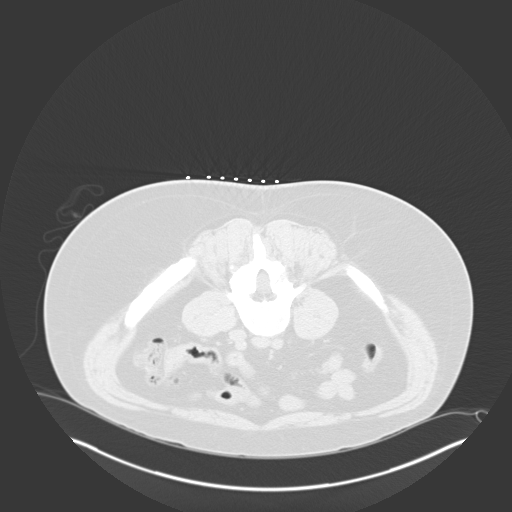
[im 41/51  mediastinal]
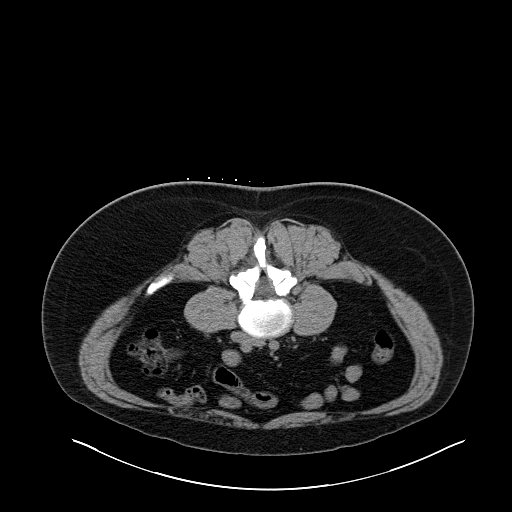
[im 41/51  lung]
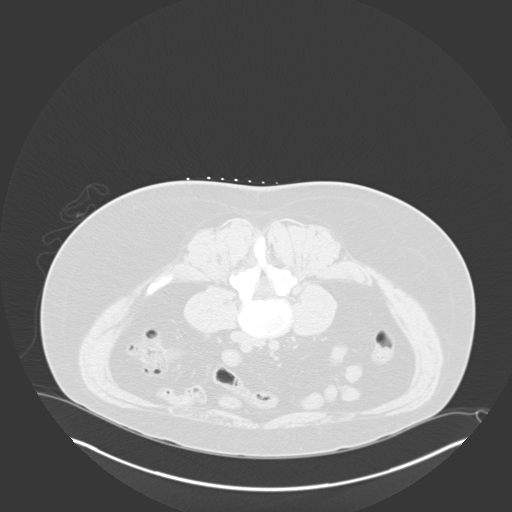
[im 45/51  lung]
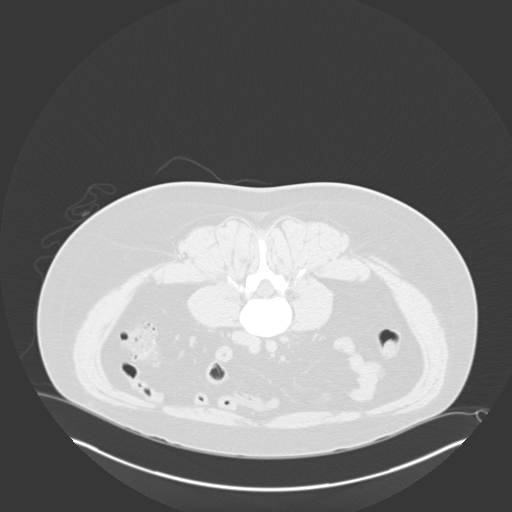
[im 49/51  lung]
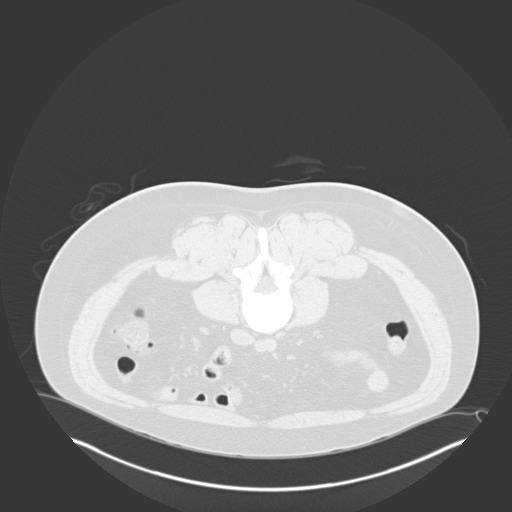

[15 of 31 positions shown; findings below may reference images not displayed]

EXAM:
CT-GUIDED BONE MARROW BIOPSY AND ASPIRATION

MEDICATIONS:
None

ANESTHESIA/SEDATION:
Fentanyl 100 mcg IV; Versed 2 mg IV

Sedation Time: 10 minutes; The patient was continuously monitored
during the procedure by the interventional radiology nurse under my
direct supervision.

COMPLICATIONS:
None immediate.

PROCEDURE:
Informed consent was obtained from the patient following an
explanation of the procedure, risks, benefits and alternatives. The
patient understands, agrees and consents for the procedure. All
questions were addressed. A time out was performed prior to the
initiation of the procedure.

The patient was positioned prone and non-contrast localization CT
was performed of the pelvis to demonstrate the iliac marrow spaces.
The operative site was prepped and draped in the usual sterile
fashion.

Under sterile conditions and local anesthesia, a 22 gauge spinal
needle was utilized for procedural planning. Next, an 11 gauge
coaxial bone biopsy needle was advanced into the right iliac marrow
space. Needle position was confirmed with CT imaging. Initially, a
bone marrow aspiration was performed. Next, a bone marrow biopsy was
obtained with the 11 gauge outer bone marrow device. Samples were
prepared with the cytotechnologist and deemed adequate. The needle
was removed and superficial hemostasis was obtained with manual
compression. A dressing was applied. The patient tolerated the
procedure well without immediate post procedural complication.
IMPRESSION: Successful CT guided right iliac bone marrow aspiration and core
biopsy.

## 2022-09-30 DIAGNOSIS — H10021 Other mucopurulent conjunctivitis, right eye: Secondary | ICD-10-CM | POA: Diagnosis not present

## 2022-10-01 DIAGNOSIS — J069 Acute upper respiratory infection, unspecified: Secondary | ICD-10-CM | POA: Diagnosis not present

## 2022-10-01 DIAGNOSIS — Z862 Personal history of diseases of the blood and blood-forming organs and certain disorders involving the immune mechanism: Secondary | ICD-10-CM | POA: Diagnosis not present

## 2022-10-01 DIAGNOSIS — H109 Unspecified conjunctivitis: Secondary | ICD-10-CM | POA: Diagnosis not present

## 2022-10-01 DIAGNOSIS — R059 Cough, unspecified: Secondary | ICD-10-CM | POA: Diagnosis not present

## 2022-10-13 ENCOUNTER — Telehealth: Payer: Self-pay | Admitting: *Deleted

## 2022-10-13 ENCOUNTER — Inpatient Hospital Stay: Payer: BC Managed Care – PPO | Admitting: Oncology

## 2022-10-13 ENCOUNTER — Inpatient Hospital Stay: Payer: BC Managed Care – PPO

## 2022-10-13 NOTE — Telephone Encounter (Signed)
PC to patient regarding missed appointment this a.m., no answer, left VM - informed patient scheduling will contact him to reschedule.  Scheduling message sent.

## 2022-10-19 DIAGNOSIS — M791 Myalgia, unspecified site: Secondary | ICD-10-CM | POA: Diagnosis not present

## 2022-10-19 DIAGNOSIS — R52 Pain, unspecified: Secondary | ICD-10-CM | POA: Diagnosis not present

## 2022-10-25 ENCOUNTER — Inpatient Hospital Stay (HOSPITAL_BASED_OUTPATIENT_CLINIC_OR_DEPARTMENT_OTHER): Payer: BC Managed Care – PPO | Admitting: Oncology

## 2022-10-25 ENCOUNTER — Inpatient Hospital Stay: Payer: BC Managed Care – PPO | Attending: Oncology

## 2022-10-25 VITALS — BP 124/88 | HR 83 | Temp 98.1°F | Resp 15 | Wt 255.6 lb

## 2022-10-25 DIAGNOSIS — Z862 Personal history of diseases of the blood and blood-forming organs and certain disorders involving the immune mechanism: Secondary | ICD-10-CM | POA: Insufficient documentation

## 2022-10-25 DIAGNOSIS — D696 Thrombocytopenia, unspecified: Secondary | ICD-10-CM | POA: Diagnosis not present

## 2022-10-25 LAB — CBC WITH DIFFERENTIAL (CANCER CENTER ONLY)
Abs Immature Granulocytes: 0.01 10*3/uL (ref 0.00–0.07)
Basophils Absolute: 0 10*3/uL (ref 0.0–0.1)
Basophils Relative: 0 %
Eosinophils Absolute: 0 10*3/uL (ref 0.0–0.5)
Eosinophils Relative: 1 %
HCT: 41.3 % (ref 39.0–52.0)
Hemoglobin: 14.4 g/dL (ref 13.0–17.0)
Immature Granulocytes: 0 %
Lymphocytes Relative: 41 %
Lymphs Abs: 1.9 10*3/uL (ref 0.7–4.0)
MCH: 31.9 pg (ref 26.0–34.0)
MCHC: 34.9 g/dL (ref 30.0–36.0)
MCV: 91.4 fL (ref 80.0–100.0)
Monocytes Absolute: 0.5 10*3/uL (ref 0.1–1.0)
Monocytes Relative: 11 %
Neutro Abs: 2.2 10*3/uL (ref 1.7–7.7)
Neutrophils Relative %: 47 %
Platelet Count: 193 10*3/uL (ref 150–400)
RBC: 4.52 MIL/uL (ref 4.22–5.81)
RDW: 12.6 % (ref 11.5–15.5)
WBC Count: 4.6 10*3/uL (ref 4.0–10.5)
nRBC: 0 % (ref 0.0–0.2)

## 2022-10-25 NOTE — Progress Notes (Signed)
Hematology and Oncology Follow Up Visit  Mark Duarte [id number] 1981/07/04 41 y.o. 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     Prior Therapy:   He is status post platelet transfusion given on April 01, 2022.  He is status post a bone marrow biopsy completed on Apr 29, 2022.  The results did not show any infiltrative bone marrow process.  Prednisone 60 mg daily started on May 31, 2022.  He completely tapered off prednisone in September 2023.  Current therapy:  Interim History: Mark Duarte returns today for a follow-up.  Since last visit, he reports feeling well without any major complaints.  He completed the prednisone taper without any residual issues.  Eating gaining weight and has some arthralgias afterwards but no bruising or bleeding complications.  He denies any recurrent infections for lymphadenopathy.     Medications: Updated on review. Current Outpatient Medications  Medication Sig Dispense Refill   albuterol (VENTOLIN HFA) 108 (90 Base) MCG/ACT inhaler Inhale 2 puffs into the lungs every 4 (four) hours as needed for wheezing or shortness of breath (cough, shortness of breath or wheezing.). 1 each 1   azithromycin (ZITHROMAX Z-PAK) 250 MG tablet Take 2 tablets (500 mg) on day one, then one tablet (250 mg) for the next four days 6 each 0   benzonatate (TESSALON) 100 MG capsule Take 1 capsule (100 mg total) by mouth every 8 (eight) hours. (Patient taking differently: Take 100 mg by mouth 3 (three) times daily as needed for cough.) 21 capsule 0   fexofenadine (ALLEGRA) 180 MG tablet Take 180 mg by mouth daily as needed for allergies or rhinitis.     fluticasone (FLONASE) 50 MCG/ACT nasal spray Place 2 sprays into both nostrils daily. (Patient taking differently: Place 2 sprays into both nostrils daily as needed for  allergies or rhinitis.) 1 g 0   HYDROMET 5-1.5 MG/5ML syrup Take 5 mLs by mouth at bedtime as needed for cough.     predniSONE (DELTASONE) 20 MG tablet Take 2.5 tablets (50 mg total) by mouth daily with breakfast. 90 tablet 1   Probiotic Product (PROBIOTIC GUMMIES) 30 MG CHEW Chew 30 g/day by mouth every morning. Taking two po daily     No current facility-administered medications for this visit.     Allergies: No Known Allergies    Physical Exam:    Blood pressure 124/88, pulse 83, temperature 98.1 F (36.7 C), temperature source Oral, resp. rate 15, weight 255 lb 9.6 oz (115.9 kg), SpO2 95 %.   ECOG: 0   General appearance: Alert, awake without any distress. Head: Atraumatic without abnormalities Oropharynx: Without any thrush or ulcers. Eyes: No scleral icterus. Lymph nodes: No lymphadenopathy noted in the cervical, supraclavicular, or axillary nodes Heart:regular rate and rhythm, without any murmurs or gallops.   Lung: Clear to auscultation without any rhonchi, wheezes or dullness to percussion. Abdomin: Soft, nontender without any shifting dullness or ascites. Musculoskeletal: No clubbing or cyanosis. Neurological: No motor or sensory deficits. Skin: No rashes or lesions.     Lab Results: Lab Results  Component Value Date   WBC 4.6 10/25/2022   HGB 14.4 10/25/2022   HCT 41.3 10/25/2022   MCV 91.4 10/25/2022   PLT 193 10/25/2022     Chemistry      Component Value Date/Time   NA 137 04/07/2022 0809   K  4.1 04/07/2022 0809   CL 105 04/07/2022 0809   CO2 26 04/07/2022 0809   BUN 20 04/07/2022 0809   CREATININE 1.02 04/07/2022 0809      Component Value Date/Time   CALCIUM 9.2 04/07/2022 0809   ALKPHOS 54 04/07/2022 0809   AST 23 04/07/2022 0809   ALT 60 (H) 04/07/2022 0809   BILITOT 0.8 04/07/2022 0809         Impression and Plan:   42 year old with:  1.  Autoimmune thrombocytopenia and neutropenia related to viral infection noted in April  2023.  He achieved a complete response to steroid therapy.  Laboratory data on October 25, 2022 were personally reviewed and showed complete normalization of his counts including neutrophils and platelets.  Treatment choices moving forward were discussed.  At this time he does not require any treatment and appears to have recovered from this acute illness  No further work-up or a follow-up visit needed at this time.  I given clear instructions to reach out to our clinic if he develops any symptoms of bruising or recurrent bleeding.   2.  Follow-up: We will be as needed in the future.   30  minutes were dedicated to this encounter.  The time was spent on updating disease status, treatment choices and outlining future plan of care review.    Zola Button, MD 11/7/20239:43 AM This encounter was created in error - please disregard.

## 2022-11-06 LAB — SAVE SMEAR(SSMR), FOR PROVIDER SLIDE REVIEW

## 2022-11-23 DIAGNOSIS — R52 Pain, unspecified: Secondary | ICD-10-CM | POA: Diagnosis not present

## 2022-11-23 DIAGNOSIS — R509 Fever, unspecified: Secondary | ICD-10-CM | POA: Diagnosis not present

## 2022-11-23 DIAGNOSIS — R0981 Nasal congestion: Secondary | ICD-10-CM | POA: Diagnosis not present

## 2022-11-23 DIAGNOSIS — Z20828 Contact with and (suspected) exposure to other viral communicable diseases: Secondary | ICD-10-CM | POA: Diagnosis not present

## 2022-12-01 DIAGNOSIS — R197 Diarrhea, unspecified: Secondary | ICD-10-CM | POA: Diagnosis not present

## 2022-12-01 DIAGNOSIS — R109 Unspecified abdominal pain: Secondary | ICD-10-CM | POA: Diagnosis not present

## 2023-03-08 DIAGNOSIS — D696 Thrombocytopenia, unspecified: Secondary | ICD-10-CM | POA: Diagnosis not present

## 2023-03-08 DIAGNOSIS — K1379 Other lesions of oral mucosa: Secondary | ICD-10-CM | POA: Diagnosis not present

## 2023-03-13 ENCOUNTER — Telehealth: Payer: Self-pay | Admitting: Oncology

## 2023-03-13 NOTE — Telephone Encounter (Signed)
Scheduled per 03/25 scheduling message, patient hs been called and notified.

## 2023-03-28 ENCOUNTER — Inpatient Hospital Stay: Payer: BC Managed Care – PPO | Attending: Hematology and Oncology

## 2023-03-28 DIAGNOSIS — D693 Immune thrombocytopenic purpura: Secondary | ICD-10-CM | POA: Diagnosis not present

## 2023-03-28 DIAGNOSIS — D696 Thrombocytopenia, unspecified: Secondary | ICD-10-CM

## 2023-03-28 DIAGNOSIS — Z87891 Personal history of nicotine dependence: Secondary | ICD-10-CM | POA: Diagnosis not present

## 2023-03-28 LAB — CBC WITH DIFFERENTIAL (CANCER CENTER ONLY)
Abs Immature Granulocytes: 0 10*3/uL (ref 0.00–0.07)
Basophils Absolute: 0 10*3/uL (ref 0.0–0.1)
Basophils Relative: 1 %
Eosinophils Absolute: 0 10*3/uL (ref 0.0–0.5)
Eosinophils Relative: 1 %
HCT: 41.3 % (ref 39.0–52.0)
Hemoglobin: 14.9 g/dL (ref 13.0–17.0)
Immature Granulocytes: 0 %
Lymphocytes Relative: 39 %
Lymphs Abs: 1.2 10*3/uL (ref 0.7–4.0)
MCH: 30.8 pg (ref 26.0–34.0)
MCHC: 36.1 g/dL — ABNORMAL HIGH (ref 30.0–36.0)
MCV: 85.3 fL (ref 80.0–100.0)
Monocytes Absolute: 0.5 10*3/uL (ref 0.1–1.0)
Monocytes Relative: 16 %
Neutro Abs: 1.3 10*3/uL — ABNORMAL LOW (ref 1.7–7.7)
Neutrophils Relative %: 43 %
Platelet Count: 83 10*3/uL — ABNORMAL LOW (ref 150–400)
RBC: 4.84 MIL/uL (ref 4.22–5.81)
RDW: 13.4 % (ref 11.5–15.5)
WBC Count: 3.1 10*3/uL — ABNORMAL LOW (ref 4.0–10.5)
nRBC: 0 % (ref 0.0–0.2)

## 2023-03-28 NOTE — Progress Notes (Signed)
[facility] [phone]   [phone]  PROGRESS NOTE  Patient Care Team: Soundra Pilon, FNP as PCP - General (Family Medicine)  Hematological/Oncological History # Autoimmune Thrombocytopenia/Neutropenia 04/01/2022: platelet transfusion for Plt of 8, WBC 2.2.  04/29/2022: bone marrow biopsy showed no evidence of underlying bone marrow disorder 05/31/2022: steroid taper that continued until Sept 2023.  10/25/2022: last visit with Dr. Clelia Croft 03/29/2023: transition care to Dr. Leonides Schanz   Interval History:  Mark Duarte 42 y.o. male with medical history significant for autoimmune thrombocytopenia/neutropenia who presents for a follow up visit. The patient's last visit was on 10/25/2022. In the interim since the last visit he has had no major changes in his health.  On exam today [name] is accompanied by his wife.  He reports that his last visit was in November 2023 and he has been doing well since that time.  He did have 1 bout of viral illness and his blood was checked at that time but his counts were stable.  He notes that he has not had any issues with fevers, chills, sweats.  He notes that the viral illness he had was likely RSV and he caught it from his children.  He also notes that when he was initially diagnosed he had a lesion on his tongue that he thought was an indicator of his counts dropping.  Unfortunately he has developed this lesion last week.  He notes that he has not had any overt signs of bleeding or bruising.  He is somewhat prone to bruising and does have a bruise on the right side of his abdomen.  He does not have any petechiae.  He reports his energy is good and overall he feels well.  He reports he typically has normal energy.  He does eat a regular diet but avoids red meat and pork.  He prefers fish and chicken and has done so for 14 years.  He reports that he did respond well to steroid therapy last time but it did increase his weight  considerably.  He otherwise denies any fevers, chills, sweats, nausea, vomiting or diarrhea.  A full 10 point ROS is otherwise negative.  MEDICAL HISTORY:  Past Medical History:  Diagnosis Date   Allergy    Asthma     SURGICAL HISTORY: Past Surgical History:  Procedure Laterality Date   CYST REMOVAL TRUNK      SOCIAL HISTORY: Social History   Socioeconomic History   Marital status: Single    Spouse name: Not on file   Number of children: Not on file   Years of education: Not on file   Highest education level: Not on file  Occupational History   Not on file  Tobacco Use   Smoking status: Former    Types: Cigarettes    Quit date: 10/11/2013    Years since quitting: 9.4   Smokeless tobacco: Never  Vaping Use   Vaping Use: Never used  Substance and Sexual Activity   Alcohol use: Yes    Alcohol/week: 5.0 standard drinks of alcohol    Types: 5 Standard drinks or equivalent per week    Comment: 2 beers/day 5 days a week   Drug use: No   Sexual activity: Not on file  Other Topics Concern   Not on file  Social History Narrative   Not on file   Social Determinants of Health   Financial Resource Strain: Not on file  Food Insecurity: Not on file  Transportation  Needs: Not on file  Physical Activity: Not on file  Stress: Not on file  Social Connections: Not on file  Intimate Partner Violence: Not on file    FAMILY HISTORY: Family History  Problem Relation Age of Onset   Diabetes Mother    Hypertension Mother    High Cholesterol Mother    Hypertension Father    High Cholesterol Father    Cancer Maternal Grandmother    Heart disease Maternal Grandfather    Diabetes Maternal Grandfather    High Cholesterol Maternal Grandfather    Stroke Maternal Grandfather    Mental illness Maternal Grandfather     ALLERGIES:  has No Known Allergies.  MEDICATIONS:  Current Outpatient Medications  Medication Sig Dispense Refill   albuterol (VENTOLIN HFA) 108 (90 Base)  MCG/ACT inhaler Inhale 2 puffs into the lungs every 4 (four) hours as needed for wheezing or shortness of breath (cough, shortness of breath or wheezing.). 1 each 1   azithromycin (ZITHROMAX Z-PAK) 250 MG tablet Take 2 tablets (500 mg) on day one, then one tablet (250 mg) for the next four days 6 each 0   benzonatate (TESSALON) 100 MG capsule Take 1 capsule (100 mg total) by mouth every 8 (eight) hours. (Patient taking differently: Take 100 mg by mouth 3 (three) times daily as needed for cough.) 21 capsule 0   fexofenadine (ALLEGRA) 180 MG tablet Take 180 mg by mouth daily as needed for allergies or rhinitis.     fluticasone (FLONASE) 50 MCG/ACT nasal spray Place 2 sprays into both nostrils daily. (Patient taking differently: Place 2 sprays into both nostrils daily as needed for allergies or rhinitis.) 1 g 0   HYDROMET 5-1.5 MG/5ML syrup Take 5 mLs by mouth at bedtime as needed for cough.     predniSONE (DELTASONE) 20 MG tablet Take 2.5 tablets (50 mg total) by mouth daily with breakfast. 90 tablet 1   Probiotic Product (PROBIOTIC GUMMIES) 30 MG CHEW Chew 30 g/day by mouth every morning. Taking two po daily     No current facility-administered medications for this visit.    REVIEW OF SYSTEMS:   Constitutional: ( - ) fevers, ( - )  chills , ( - ) night sweats Eyes: ( - ) blurriness of vision, ( - ) double vision, ( - ) watery eyes Ears, nose, mouth, throat, and face: ( - ) mucositis, ( - ) sore throat Respiratory: ( - ) cough, ( - ) dyspnea, ( - ) wheezes Cardiovascular: ( - ) palpitation, ( - ) chest discomfort, ( - ) lower extremity swelling Gastrointestinal:  ( - ) nausea, ( - ) heartburn, ( - ) change in bowel habits Skin: ( - ) abnormal skin rashes Lymphatics: ( - ) new lymphadenopathy, ( - ) easy bruising Neurological: ( - ) numbness, ( - ) tingling, ( - ) new weaknesses Behavioral/Psych: ( - ) mood change, ( - ) new changes  All other systems were reviewed with the patient and are  negative.  PHYSICAL EXAMINATION:  Vitals:   03/29/23 0840  BP: 115/78  Pulse: 79  Resp: 14  Temp: (!) 97.5 F (36.4 C)  SpO2: 98%   Filed Weights   03/29/23 0840  Weight: 255 lb 6.4 oz (115.8 kg)    GENERAL: well-appearing middle-aged Caucasian male, alert, no distress and comfortable SKIN: skin color, texture, turgor are normal, no rashes or significant lesions EYES: conjunctiva are pink and non-injected, sclera clear LUNGS: clear to auscultation and percussion with  normal breathing effort HEART: regular rate & rhythm and no murmurs and no lower extremity edema Musculoskeletal: no cyanosis of digits and no clubbing  PSYCH: alert & oriented x 3, fluent speech NEURO: no focal motor/sensory deficits  LABORATORY DATA:  I have reviewed the data as listed    Latest Ref Rng & Units 03/29/2023    9:01 AM 03/28/2023    9:41 AM 10/25/2022    9:29 AM  CBC  WBC 4.0 - 10.5 K/uL 2.6  3.1  4.6   Hemoglobin 13.0 - 17.0 g/dL 44.0  34.7  42.5   Hematocrit 39.0 - 52.0 % 43.1  41.3  41.3   Platelets 150 - 400 K/uL 87  83  193        Latest Ref Rng & Units 03/29/2023    9:01 AM 04/07/2022    8:09 AM 04/04/2022    4:14 AM  CMP  Glucose 70 - 99 mg/dL 95  956  387   BUN 6 - 20 mg/dL 17  20  17    Creatinine 0.61 - 1.24 mg/dL 5.64  3.32  9.51   Sodium 135 - 145 mmol/L 141  137  143   Potassium 3.5 - 5.1 mmol/L 4.2  4.1  3.9   Chloride 98 - 111 mmol/L 107  105  110   CO2 22 - 32 mmol/L 29  26  26    Calcium 8.9 - 10.3 mg/dL 9.1  9.2  8.6   Total Protein 6.5 - 8.1 g/dL 7.1  7.2  6.3   Total Bilirubin 0.3 - 1.2 mg/dL 0.8  0.8  0.4   Alkaline Phos 38 - 126 U/L 73  54  45   AST 15 - 41 U/L 18  23  14    ALT 0 - 44 U/L 25  60  23     RADIOGRAPHIC STUDIES: No results found.  ASSESSMENT & PLAN DARRAGH GRUENWALD 42 y.o. male with medical history significant for autoimmune thrombocytopenia/neutropenia who presents for a follow up visit.   # Autoimmune Thrombocytopenia/Neutropenia -- Labs  today show white blood cell count 3.1, hemoglobin 14.9, MCV 85.3, and platelets of 83.  This is down from his prior platelets of 193 on 10/25/2022. -- Will order thrombocytopenia labs to include immature platelet fraction, nutritional panels, and review of peripheral blood film. -- Due to drop in platelet counts will order platelets in 1 week, 2 weeks, and 4 weeks.  We will have put a placeholder clinic visit in 3 months time.  Orders Placed This Encounter  Procedures   CBC with Differential (Cancer Center Only)    Standing Status:   Future    Number of Occurrences:   1    Standing Expiration Date:   03/28/2024   CMP (Cancer Center only)    Standing Status:   Future    Number of Occurrences:   1    Standing Expiration Date:   03/28/2024   Vitamin B12    Standing Status:   Future    Number of Occurrences:   1    Standing Expiration Date:   03/28/2024   Folate, Serum    Standing Status:   Future    Number of Occurrences:   1    Standing Expiration Date:   03/28/2024   Methylmalonic acid, serum    Standing Status:   Future    Number of Occurrences:   1    Standing Expiration Date:   03/28/2024   Immature Platelet Fraction  Standing Status:   Future    Number of Occurrences:   1    Standing Expiration Date:   03/28/2024   Retic Panel    Standing Status:   Future    Number of Occurrences:   1    Standing Expiration Date:   03/28/2024    All questions were answered. The patient knows to call the clinic with any problems, questions or concerns.  A total of more than 40 minutes were spent on this encounter with face-to-face time and non-face-to-face time, including preparing to see the patient, ordering tests and/or medications, counseling the patient and coordination of care as outlined above.   Ulysees Barns, MD Department of Hematology/Oncology [facility] Phone: [phone] Pager: [phone] Email: [email or web address]  03/30/2023  4:45 PM

## 2023-03-29 ENCOUNTER — Inpatient Hospital Stay: Payer: BC Managed Care – PPO

## 2023-03-29 ENCOUNTER — Inpatient Hospital Stay (HOSPITAL_BASED_OUTPATIENT_CLINIC_OR_DEPARTMENT_OTHER): Payer: BC Managed Care – PPO | Admitting: Hematology and Oncology

## 2023-03-29 VITALS — BP 115/78 | HR 79 | Temp 97.5°F | Resp 14 | Wt 255.4 lb

## 2023-03-29 DIAGNOSIS — Z87891 Personal history of nicotine dependence: Secondary | ICD-10-CM | POA: Diagnosis not present

## 2023-03-29 DIAGNOSIS — D709 Neutropenia, unspecified: Secondary | ICD-10-CM

## 2023-03-29 DIAGNOSIS — D696 Thrombocytopenia, unspecified: Secondary | ICD-10-CM

## 2023-03-29 DIAGNOSIS — D693 Immune thrombocytopenic purpura: Secondary | ICD-10-CM | POA: Diagnosis not present

## 2023-03-29 LAB — RETIC PANEL
Immature Retic Fract: 12.6 % (ref 2.3–15.9)
RBC.: 4.91 MIL/uL (ref 4.22–5.81)
Retic Count, Absolute: 101.1 10*3/uL (ref 19.0–186.0)
Retic Ct Pct: 2.1 % (ref 0.4–3.1)
Reticulocyte Hemoglobin: 33.8 pg (ref >27.9)

## 2023-03-29 LAB — CMP (CANCER CENTER ONLY)
ALT: 25 U/L (ref 0–44)
AST: 18 U/L (ref 15–41)
Albumin: 4.3 g/dL (ref 3.5–5.0)
Alkaline Phosphatase: 73 U/L (ref 38–126)
Anion gap: 5 (ref 5–15)
BUN: 17 mg/dL (ref 6–20)
CO2: 29 mmol/L (ref 22–32)
Calcium: 9.1 mg/dL (ref 8.9–10.3)
Chloride: 107 mmol/L (ref 98–111)
Creatinine: 1.01 mg/dL (ref 0.61–1.24)
GFR, Estimated: >60 mL/min (ref >60)
Glucose, Bld: 95 mg/dL (ref 70–99)
Potassium: 4.2 mmol/L (ref 3.5–5.1)
Sodium: 141 mmol/L (ref 135–145)
Total Bilirubin: 0.8 mg/dL (ref 0.3–1.2)
Total Protein: 7.1 g/dL (ref 6.5–8.1)

## 2023-03-29 LAB — FOLATE: Folate: 17.6 ng/mL (ref >5.9)

## 2023-03-29 LAB — CBC WITH DIFFERENTIAL (CANCER CENTER ONLY)
Abs Immature Granulocytes: 0 10*3/uL (ref 0.00–0.07)
Basophils Absolute: 0 10*3/uL (ref 0.0–0.1)
Basophils Relative: 0 %
Eosinophils Absolute: 0 10*3/uL (ref 0.0–0.5)
Eosinophils Relative: 1 %
HCT: 43.1 % (ref 39.0–52.0)
Hemoglobin: 14.9 g/dL (ref 13.0–17.0)
Immature Granulocytes: 0 %
Lymphocytes Relative: 50 %
Lymphs Abs: 1.3 10*3/uL (ref 0.7–4.0)
MCH: 30 pg (ref 26.0–34.0)
MCHC: 34.6 g/dL (ref 30.0–36.0)
MCV: 86.9 fL (ref 80.0–100.0)
Monocytes Absolute: 0.4 10*3/uL (ref 0.1–1.0)
Monocytes Relative: 16 %
Neutro Abs: 0.8 10*3/uL — ABNORMAL LOW (ref 1.7–7.7)
Neutrophils Relative %: 33 %
Platelet Count: 87 10*3/uL — ABNORMAL LOW (ref 150–400)
RBC: 4.96 MIL/uL (ref 4.22–5.81)
RDW: 13.3 % (ref 11.5–15.5)
Smear Review: NORMAL
WBC Count: 2.6 10*3/uL — ABNORMAL LOW (ref 4.0–10.5)
nRBC: 0 % (ref 0.0–0.2)

## 2023-03-29 LAB — VITAMIN B12: Vitamin B-12: 398 pg/mL (ref 180–914)

## 2023-03-29 LAB — IMMATURE PLATELET FRACTION: Immature Platelet Fraction: 2.9 % (ref 1.2–8.6)

## 2023-03-31 ENCOUNTER — Telehealth: Payer: Self-pay | Admitting: *Deleted

## 2023-03-31 ENCOUNTER — Telehealth: Payer: Self-pay | Admitting: Hematology and Oncology

## 2023-03-31 NOTE — Telephone Encounter (Signed)
-----   Message from Jaci Standard, MD sent at 03/30/2023  4:45 PM EDT ----- Please let Mark Duarte know that his lab work did not show any abnormalities other than his low white blood cell count and low platelets.  At this time would recommend we repeat his labs in 1 week, 2 weeks, in 4 weeks.  We will monitor the trend and if it looks like he is dropping we can consider starting a pulse of steroids.  I also put a placeholder clinic visit in 3 months time, but if there are concerning abnormalities with his blood work we will certainly see him sooner.  ----- Message ----- From: Leory Plowman, [facility] [place] Sent: 03/29/2023   9:16 AM EDT To: Jaci Standard, MD

## 2023-03-31 NOTE — Telephone Encounter (Signed)
TCT patient regarding recent lab results. Spoke with him. Advised that his lab work did not show any abnormalities other than his low white blood cell count and low platelets. At this time would recommend we repeat his labs in 1 week, 2 weeks, in 4 weeks. We will monitor the trend and if it looks like he is dropping we can consider starting a pulse of steroids. I also put a placeholder clinic visit in 3 months time, but if there are concerning abnormalities with his blood work we will certainly see him sooner. Pt voiced understanding and is aware of his upcoming appts.

## 2023-03-31 NOTE — Telephone Encounter (Signed)
Reached out to patient per 4/10 LOS, patient aware of date and times of appointments.

## 2023-04-04 LAB — METHYLMALONIC ACID, SERUM: Methylmalonic Acid, Quantitative: 142 nmol/L (ref 0–378)

## 2023-04-05 ENCOUNTER — Inpatient Hospital Stay: Payer: BC Managed Care – PPO

## 2023-04-06 ENCOUNTER — Other Ambulatory Visit: Payer: Self-pay | Admitting: *Deleted

## 2023-04-06 ENCOUNTER — Inpatient Hospital Stay: Payer: BC Managed Care – PPO

## 2023-04-06 ENCOUNTER — Telehealth: Payer: Self-pay | Admitting: *Deleted

## 2023-04-06 DIAGNOSIS — D693 Immune thrombocytopenic purpura: Secondary | ICD-10-CM | POA: Diagnosis not present

## 2023-04-06 DIAGNOSIS — D696 Thrombocytopenia, unspecified: Secondary | ICD-10-CM

## 2023-04-06 DIAGNOSIS — Z87891 Personal history of nicotine dependence: Secondary | ICD-10-CM | POA: Diagnosis not present

## 2023-04-06 LAB — CBC WITH DIFFERENTIAL (CANCER CENTER ONLY)
Abs Immature Granulocytes: 0 10*3/uL (ref 0.00–0.07)
Basophils Absolute: 0 10*3/uL (ref 0.0–0.1)
Basophils Relative: 0 %
Eosinophils Absolute: 0 10*3/uL (ref 0.0–0.5)
Eosinophils Relative: 1 %
HCT: 42.1 % (ref 39.0–52.0)
Hemoglobin: 14.8 g/dL (ref 13.0–17.0)
Immature Granulocytes: 0 %
Lymphocytes Relative: 65 %
Lymphs Abs: 1.5 10*3/uL (ref 0.7–4.0)
MCH: 30.4 pg (ref 26.0–34.0)
MCHC: 35.2 g/dL (ref 30.0–36.0)
MCV: 86.4 fL (ref 80.0–100.0)
Monocytes Absolute: 0.4 10*3/uL (ref 0.1–1.0)
Monocytes Relative: 16 %
Neutro Abs: 0.4 10*3/uL — CL (ref 1.7–7.7)
Neutrophils Relative %: 18 %
Platelet Count: 104 10*3/uL — ABNORMAL LOW (ref 150–400)
RBC: 4.87 MIL/uL (ref 4.22–5.81)
RDW: 13.2 % (ref 11.5–15.5)
Smear Review: NORMAL
WBC Count: 2.3 10*3/uL — ABNORMAL LOW (ref 4.0–10.5)
nRBC: 0 % (ref 0.0–0.2)

## 2023-04-06 MED ORDER — DEXAMETHASONE 20 MG PO TABS: 40.0000 mg | ORAL_TABLET | Freq: Every morning | ORAL | 0 refills | Status: DC

## 2023-04-06 NOTE — Telephone Encounter (Signed)
CRITICAL VALUE STICKER  CRITICAL VALUE: ANC 0.4  RECEIVER (on-site recipient of call): Binnie Rail, RN  DATE & TIME NOTIFIED: 9:30  MESSENGER (representative from lab): Hilda Lias  MD NOTIFIED: Dr. Leonides Schanz  TIME OF NOTIFICATION: 9:35 am  RESPONSE:  Dr. Leonides Schanz ordered Dexamethasone 40 mg po x 4 days. Recheck labs in 1 week  PT to hold prednisone for 4 days. Resume after completes Dex   Pt made aware via phone call.

## 2023-04-07 ENCOUNTER — Telehealth: Payer: Self-pay | Admitting: *Deleted

## 2023-04-07 ENCOUNTER — Other Ambulatory Visit (HOSPITAL_COMMUNITY): Payer: Self-pay

## 2023-04-07 ENCOUNTER — Other Ambulatory Visit: Payer: Self-pay | Admitting: *Deleted

## 2023-04-07 MED ORDER — DEXAMETHASONE 4 MG PO TABS
40.0000 mg | ORAL_TABLET | Freq: Every morning | ORAL | 0 refills | Status: DC
  Filled 2023-04-07: qty 40, 4d supply, fill #0

## 2023-04-07 NOTE — Telephone Encounter (Signed)
Received call from pt stating tht his pharmacy doesn't carry Dexamethsone 20 mg tabs and if they did it would cost $300. Advised that I would send prescription to [facility]. Pt can then ask them the cost. Advised that if it was too much to call me back and I would discuss another option with Dr. Leonides Schanz. Patient also asking to review neutropenic precautions as his wife and daughter have URI right now Advised on mask wearing, handwashing. Advised to call here with Temp>100.4  Pt voiced understanding.

## 2023-04-07 NOTE — Telephone Encounter (Signed)
See previous note

## 2023-04-10 ENCOUNTER — Telehealth: Payer: Self-pay | Admitting: *Deleted

## 2023-04-10 NOTE — Telephone Encounter (Signed)
After discussing pt's situation with the prednisone with Dr. Leonides Schanz, Dr. Leonides Schanz has decided to hold the prednisone for now. He will review his labs that will be done on Thursday and then decide next course of action. Pt called and notified of the above change. Pt voiced understanding

## 2023-04-10 NOTE — Telephone Encounter (Signed)
Received call from pt stated that he had some side effects from the Dexamthasone over the weekend-chest discomfort, muscle aches and fatigue. He states he completed the 4 days of Dex. Advised to resume the Prednisone 20 mg daily tomorrow. He voiced understanding. But he siad he made a mistake last week. He states he never started the daily prednisone that had been ordered by Dr. Clelia Croft. Advised that we were under the impression that he was taking it. Pt apologized for not letting us know. Will advise Dr. Leonides Schanz about the above.

## 2023-04-12 ENCOUNTER — Inpatient Hospital Stay: Payer: BC Managed Care – PPO

## 2023-04-12 ENCOUNTER — Telehealth: Payer: Self-pay | Admitting: *Deleted

## 2023-04-12 NOTE — Telephone Encounter (Signed)
Received call from pt. He states he has an URI -he caught thsi from his wife and daughter. They both tested negative for Covid. He staes he does not have a fever but endorses sore throat, muscle aches, sinus congestion. Advised to treat his URI symptomatically but if he develops a fever  he needs to call back ASAP. Encouraged increased fluid intake,ibuprofen for sore throat and a decongestant for his sinuses. Advised to call back if symptoms get worse.

## 2023-04-13 ENCOUNTER — Other Ambulatory Visit: Payer: Self-pay

## 2023-04-13 ENCOUNTER — Inpatient Hospital Stay: Payer: BC Managed Care – PPO

## 2023-04-13 ENCOUNTER — Telehealth: Payer: Self-pay | Admitting: *Deleted

## 2023-04-13 ENCOUNTER — Telehealth: Payer: Self-pay | Admitting: Hematology and Oncology

## 2023-04-13 ENCOUNTER — Other Ambulatory Visit: Payer: Self-pay | Admitting: Hematology and Oncology

## 2023-04-13 DIAGNOSIS — D696 Thrombocytopenia, unspecified: Secondary | ICD-10-CM

## 2023-04-13 DIAGNOSIS — D693 Immune thrombocytopenic purpura: Secondary | ICD-10-CM | POA: Diagnosis not present

## 2023-04-13 DIAGNOSIS — Z87891 Personal history of nicotine dependence: Secondary | ICD-10-CM | POA: Diagnosis not present

## 2023-04-13 LAB — CMP (CANCER CENTER ONLY)
ALT: 26 U/L (ref 0–44)
AST: 11 U/L — ABNORMAL LOW (ref 15–41)
Albumin: 4 g/dL (ref 3.5–5.0)
Alkaline Phosphatase: 53 U/L (ref 38–126)
Anion gap: 7 (ref 5–15)
BUN: 19 mg/dL (ref 6–20)
CO2: 28 mmol/L (ref 22–32)
Calcium: 8.6 mg/dL — ABNORMAL LOW (ref 8.9–10.3)
Chloride: 105 mmol/L (ref 98–111)
Creatinine: 1.08 mg/dL (ref 0.61–1.24)
GFR, Estimated: >60 mL/min (ref >60)
Glucose, Bld: 155 mg/dL — ABNORMAL HIGH (ref 70–99)
Potassium: 3.9 mmol/L (ref 3.5–5.1)
Sodium: 140 mmol/L (ref 135–145)
Total Bilirubin: 0.7 mg/dL (ref 0.3–1.2)
Total Protein: 6.3 g/dL — ABNORMAL LOW (ref 6.5–8.1)

## 2023-04-13 LAB — CBC WITH DIFFERENTIAL (CANCER CENTER ONLY)
Abs Immature Granulocytes: 0.14 10*3/uL — ABNORMAL HIGH (ref 0.00–0.07)
Basophils Absolute: 0 10*3/uL (ref 0.0–0.1)
Basophils Relative: 0 %
Eosinophils Absolute: 0.1 10*3/uL (ref 0.0–0.5)
Eosinophils Relative: 1 %
HCT: 43.1 % (ref 39.0–52.0)
Hemoglobin: 15.1 g/dL (ref 13.0–17.0)
Immature Granulocytes: 2 %
Lymphocytes Relative: 29 %
Lymphs Abs: 2.2 10*3/uL (ref 0.7–4.0)
MCH: 30.1 pg (ref 26.0–34.0)
MCHC: 35 g/dL (ref 30.0–36.0)
MCV: 86 fL (ref 80.0–100.0)
Monocytes Absolute: 0.5 10*3/uL (ref 0.1–1.0)
Monocytes Relative: 7 %
Neutro Abs: 4.6 10*3/uL (ref 1.7–7.7)
Neutrophils Relative %: 61 %
Platelet Count: 172 10*3/uL (ref 150–400)
RBC: 5.01 MIL/uL (ref 4.22–5.81)
RDW: 13.2 % (ref 11.5–15.5)
WBC Count: 7.5 10*3/uL (ref 4.0–10.5)
nRBC: 0.3 % — ABNORMAL HIGH (ref 0.0–0.2)

## 2023-04-13 LAB — LACTATE DEHYDROGENASE: LDH: 150 U/L (ref 98–192)

## 2023-04-13 NOTE — Telephone Encounter (Signed)
Reached out to patient to add office visit per 4/25 IB message, left voicemail.

## 2023-04-13 NOTE — Telephone Encounter (Signed)
-----   Message from Ulysees Barns IV, MD sent at 04/13/2023 10:38 AM EDT ----- Please let Mr. Churchwell know that both his platelets and his white blood cell count have robustly rebounded into the normal range.  We will continue to monitor his blood counts in order to assure this is sustained. We will collect labs on 5/9 as scheduled.  Please schedule a clinic visit in 5 weeks time with labs. ----- Message ----- From: Leory Plowman, Lab In [place] Sent: 04/13/2023   9:38 AM EDT To: Jaci Standard, MD

## 2023-04-13 NOTE — Telephone Encounter (Signed)
TCT patient regarding recent lab results. Spoke with him. Advised that both his platelets and his white blood cell count have robustly rebounded into the normal range. We will continue to monitor his blood counts in order to assure this is sustained. We will collect labs on 5/9 as scheduled. Also advised that we will schedule a clinic visit in 5 weeks with Dr. Leonides Schanz with lab appt prior to clinic visit. Pt voiced understanding. Inquired about the state of his URI. He states he still pretty rough with sinus congestion and sore throat. Advised to call PCP and see him today if possible. Pt voiced understanding to all of the above.  Scheduling message sent

## 2023-04-14 DIAGNOSIS — R059 Cough, unspecified: Secondary | ICD-10-CM | POA: Diagnosis not present

## 2023-04-14 DIAGNOSIS — J320 Chronic maxillary sinusitis: Secondary | ICD-10-CM | POA: Diagnosis not present

## 2023-04-26 ENCOUNTER — Other Ambulatory Visit: Payer: Self-pay

## 2023-04-26 ENCOUNTER — Inpatient Hospital Stay: Payer: BC Managed Care – PPO

## 2023-04-26 DIAGNOSIS — D709 Neutropenia, unspecified: Secondary | ICD-10-CM

## 2023-04-26 DIAGNOSIS — D696 Thrombocytopenia, unspecified: Secondary | ICD-10-CM

## 2023-04-27 ENCOUNTER — Other Ambulatory Visit: Payer: Self-pay

## 2023-04-27 ENCOUNTER — Inpatient Hospital Stay: Payer: BC Managed Care – PPO | Attending: Hematology and Oncology

## 2023-04-27 DIAGNOSIS — D709 Neutropenia, unspecified: Secondary | ICD-10-CM

## 2023-04-27 DIAGNOSIS — D693 Immune thrombocytopenic purpura: Secondary | ICD-10-CM | POA: Diagnosis not present

## 2023-04-27 DIAGNOSIS — D696 Thrombocytopenia, unspecified: Secondary | ICD-10-CM

## 2023-04-27 LAB — CMP (CANCER CENTER ONLY)
ALT: 26 U/L (ref 0–44)
AST: 14 U/L — ABNORMAL LOW (ref 15–41)
Albumin: 4.3 g/dL (ref 3.5–5.0)
Alkaline Phosphatase: 65 U/L (ref 38–126)
Anion gap: 6 (ref 5–15)
BUN: 21 mg/dL — ABNORMAL HIGH (ref 6–20)
CO2: 27 mmol/L (ref 22–32)
Calcium: 8.6 mg/dL — ABNORMAL LOW (ref 8.9–10.3)
Chloride: 107 mmol/L (ref 98–111)
Creatinine: 1.12 mg/dL (ref 0.61–1.24)
GFR, Estimated: >60 mL/min (ref >60)
Glucose, Bld: 89 mg/dL (ref 70–99)
Potassium: 4.3 mmol/L (ref 3.5–5.1)
Sodium: 140 mmol/L (ref 135–145)
Total Bilirubin: 0.8 mg/dL (ref 0.3–1.2)
Total Protein: 6.9 g/dL (ref 6.5–8.1)

## 2023-04-27 LAB — CBC WITH DIFFERENTIAL (CANCER CENTER ONLY)
Abs Immature Granulocytes: 0.02 10*3/uL (ref 0.00–0.07)
Basophils Absolute: 0 10*3/uL (ref 0.0–0.1)
Basophils Relative: 0 %
Eosinophils Absolute: 0 10*3/uL (ref 0.0–0.5)
Eosinophils Relative: 0 %
HCT: 41.1 % (ref 39.0–52.0)
Hemoglobin: 14.3 g/dL (ref 13.0–17.0)
Immature Granulocytes: 0 %
Lymphocytes Relative: 19 %
Lymphs Abs: 1.4 10*3/uL (ref 0.7–4.0)
MCH: 30.7 pg (ref 26.0–34.0)
MCHC: 34.8 g/dL (ref 30.0–36.0)
MCV: 88.2 fL (ref 80.0–100.0)
Monocytes Absolute: 0.5 10*3/uL (ref 0.1–1.0)
Monocytes Relative: 6 %
Neutro Abs: 5.6 10*3/uL (ref 1.7–7.7)
Neutrophils Relative %: 75 %
Platelet Count: 131 10*3/uL — ABNORMAL LOW (ref 150–400)
RBC: 4.66 MIL/uL (ref 4.22–5.81)
RDW: 13 % (ref 11.5–15.5)
WBC Count: 7.5 10*3/uL (ref 4.0–10.5)
nRBC: 0 % (ref 0.0–0.2)

## 2023-05-01 ENCOUNTER — Telehealth: Payer: Self-pay | Admitting: *Deleted

## 2023-05-01 ENCOUNTER — Telehealth: Payer: Self-pay | Admitting: Hematology and Oncology

## 2023-05-01 NOTE — Telephone Encounter (Signed)
TCT patient regarding recent lab results. Spoke with pt. Advised that labs show WBC and neutrophil counts are normal. Plts have oscillated the last several weeks and now is 131K, which we can monitor.  Pt happy with these results. Advised that we plan to see him in the clinic on 05/18/23 with labs prior to clinic visit. Pt voiced understanding. He states he ended up taking an antibiotic for an URI. He is feeling much better after 3 days on the antibiotics.   No further questions or concerns.

## 2023-05-01 NOTE — Telephone Encounter (Signed)
-----   Message from Briant Cedar, PA-C sent at 05/01/2023 10:01 AM EDT ----- Can you notify patient that labs show WBC and neutrophil counts are normal. Plts have oscillated the last several weeks and now is 131K, which we can monitor. He is scheduled to see Dr. Leonides Schanz on 5/30. Can we add a lab appt same day?   ----- Message ----- From: Interface, Lab In [place] Sent: 04/27/2023   9:21 AM EDT To: Briant Cedar, PA-C

## 2023-05-18 ENCOUNTER — Inpatient Hospital Stay: Payer: BC Managed Care – PPO

## 2023-05-18 ENCOUNTER — Inpatient Hospital Stay (HOSPITAL_BASED_OUTPATIENT_CLINIC_OR_DEPARTMENT_OTHER): Payer: BC Managed Care – PPO | Admitting: Hematology and Oncology

## 2023-05-18 ENCOUNTER — Other Ambulatory Visit: Payer: Self-pay | Admitting: Hematology and Oncology

## 2023-05-18 VITALS — BP 117/76 | HR 78 | Temp 98.1°F | Resp 15 | Wt 252.5 lb

## 2023-05-18 DIAGNOSIS — D696 Thrombocytopenia, unspecified: Secondary | ICD-10-CM

## 2023-05-18 DIAGNOSIS — D709 Neutropenia, unspecified: Secondary | ICD-10-CM | POA: Diagnosis not present

## 2023-05-18 DIAGNOSIS — D693 Immune thrombocytopenic purpura: Secondary | ICD-10-CM | POA: Diagnosis not present

## 2023-05-18 LAB — CMP (CANCER CENTER ONLY)
ALT: 27 U/L (ref 0–44)
AST: 17 U/L (ref 15–41)
Albumin: 4.3 g/dL (ref 3.5–5.0)
Alkaline Phosphatase: 67 U/L (ref 38–126)
Anion gap: 6 (ref 5–15)
BUN: 18 mg/dL (ref 6–20)
CO2: 26 mmol/L (ref 22–32)
Calcium: 8.7 mg/dL — ABNORMAL LOW (ref 8.9–10.3)
Chloride: 108 mmol/L (ref 98–111)
Creatinine: 1.1 mg/dL (ref 0.61–1.24)
GFR, Estimated: >60 mL/min (ref >60)
Glucose, Bld: 132 mg/dL — ABNORMAL HIGH (ref 70–99)
Potassium: 4.2 mmol/L (ref 3.5–5.1)
Sodium: 140 mmol/L (ref 135–145)
Total Bilirubin: 0.7 mg/dL (ref 0.3–1.2)
Total Protein: 6.8 g/dL (ref 6.5–8.1)

## 2023-05-18 LAB — CBC WITH DIFFERENTIAL (CANCER CENTER ONLY)
Abs Immature Granulocytes: 0.01 10*3/uL (ref 0.00–0.07)
Basophils Absolute: 0 10*3/uL (ref 0.0–0.1)
Basophils Relative: 0 %
Eosinophils Absolute: 0 10*3/uL (ref 0.0–0.5)
Eosinophils Relative: 0 %
HCT: 41.5 % (ref 39.0–52.0)
Hemoglobin: 14.6 g/dL (ref 13.0–17.0)
Immature Granulocytes: 0 %
Lymphocytes Relative: 29 %
Lymphs Abs: 1.5 10*3/uL (ref 0.7–4.0)
MCH: 30.9 pg (ref 26.0–34.0)
MCHC: 35.2 g/dL (ref 30.0–36.0)
MCV: 87.9 fL (ref 80.0–100.0)
Monocytes Absolute: 0.4 10*3/uL (ref 0.1–1.0)
Monocytes Relative: 8 %
Neutro Abs: 3.3 10*3/uL (ref 1.7–7.7)
Neutrophils Relative %: 63 %
Platelet Count: 165 10*3/uL (ref 150–400)
RBC: 4.72 MIL/uL (ref 4.22–5.81)
RDW: 12.7 % (ref 11.5–15.5)
WBC Count: 5.1 10*3/uL (ref 4.0–10.5)
nRBC: 0 % (ref 0.0–0.2)

## 2023-05-18 NOTE — Progress Notes (Signed)
[facility] [phone]   [phone]) [phone]  PROGRESS NOTE  Patient Care Team: Soundra Pilon, FNP as PCP - General (Family Medicine)  Hematological/Oncological History # Autoimmune Thrombocytopenia/Neutropenia 04/01/2022: platelet transfusion for Plt of 8, WBC 2.2.  04/29/2022: bone marrow biopsy showed no evidence of underlying bone marrow disorder 05/31/2022: steroid taper that continued until Sept 2023.  10/25/2022: last visit with Dr. Clelia Croft 03/29/2023: transition care to Dr. Leonides Schanz  04/07/2023: Dex 40 mg x 4 days pulse. Patient's counts responded.   Interval History:  Mark Duarte 42 y.o. male with medical history significant for autoimmune thrombocytopenia/neutropenia who presents for a follow up visit. The patient's last visit was on 03/29/2023. In the interim since the last visit he has had no major changes in his health.  On exam today Mark Duarte is accompanied by his wife.  He reports he completed his Dex pulse last month but it was "intense".  He reports he was having chest issues and felt his airway was tightening up.  He notes it was pretty hard on his body and he asked you to take a day off work.  He was having shortness of breath and low energy but by day 4 he had adapted.  He reports that he did not have any subsequent bleeding or bruising.  He did develop a cough and cold about 1 week after completing the steroids but it did resolve.  He reports that over the last week he is felt quite well, recently getting over a residual cough.  He notes now he is back to "100%".  He otherwise denies any fevers, chills, sweats, nausea, vomiting or diarrhea.  A full 10 point ROS is otherwise negative.  MEDICAL HISTORY:  Past Medical History:  Diagnosis Date   Allergy    Asthma     SURGICAL HISTORY: Past Surgical History:  Procedure Laterality Date   CYST REMOVAL TRUNK      SOCIAL HISTORY: Social History   Socioeconomic History   Marital status:  Single    Spouse name: Not on file   Number of children: Not on file   Years of education: Not on file   Highest education level: Not on file  Occupational History   Not on file  Tobacco Use   Smoking status: Former    Types: Cigarettes    Quit date: 10/11/2013    Years since quitting: 9.6   Smokeless tobacco: Never  Vaping Use   Vaping Use: Never used  Substance and Sexual Activity   Alcohol use: Yes    Alcohol/week: 5.0 standard drinks of alcohol    Types: 5 Standard drinks or equivalent per week    Comment: 2 beers/day 5 days a week   Drug use: No   Sexual activity: Not on file  Other Topics Concern   Not on file  Social History Narrative   Not on file   Social Determinants of Health   Financial Resource Strain: Not on file  Food Insecurity: Not on file  Transportation Needs: Not on file  Physical Activity: Not on file  Stress: Not on file  Social Connections: Not on file  Intimate Partner Violence: Not on file    FAMILY HISTORY: Family History  Problem Relation Age of Onset   Diabetes Mother    Hypertension Mother    High Cholesterol Mother    Hypertension Father    High Cholesterol Father    Cancer Maternal Grandmother    Heart disease Maternal Grandfather  Diabetes Maternal Grandfather    High Cholesterol Maternal Grandfather    Stroke Maternal Grandfather    Mental illness Maternal Grandfather     ALLERGIES:  has No Known Allergies.  MEDICATIONS:  Current Outpatient Medications  Medication Sig Dispense Refill   albuterol (VENTOLIN HFA) 108 (90 Base) MCG/ACT inhaler Inhale 2 puffs into the lungs every 4 (four) hours as needed for wheezing or shortness of breath (cough, shortness of breath or wheezing.). 1 each 1   azithromycin (ZITHROMAX Z-PAK) 250 MG tablet Take 2 tablets (500 mg) on day one, then one tablet (250 mg) for the next four days 6 each 0   benzonatate (TESSALON) 100 MG capsule Take 1 capsule (100 mg total) by mouth every 8 (eight)  hours. (Patient taking differently: Take 100 mg by mouth 3 (three) times daily as needed for cough.) 21 capsule 0   dexamethasone (DECADRON) 4 MG tablet Take 10 tablets (40 mg total) by mouth every morning with food for 4 days 40 tablet 0   fexofenadine (ALLEGRA) 180 MG tablet Take 180 mg by mouth daily as needed for allergies or rhinitis.     fluticasone (FLONASE) 50 MCG/ACT nasal spray Place 2 sprays into both nostrils daily. (Patient taking differently: Place 2 sprays into both nostrils daily as needed for allergies or rhinitis.) 1 g 0   HYDROMET 5-1.5 MG/5ML syrup Take 5 mLs by mouth at bedtime as needed for cough.     predniSONE (DELTASONE) 20 MG tablet Take 2.5 tablets (50 mg total) by mouth daily with breakfast. 90 tablet 1   Probiotic Product (PROBIOTIC GUMMIES) 30 MG CHEW Chew 30 g/day by mouth every morning. Taking two po daily     No current facility-administered medications for this visit.    REVIEW OF SYSTEMS:   Constitutional: ( - ) fevers, ( - )  chills , ( - ) night sweats Eyes: ( - ) blurriness of vision, ( - ) double vision, ( - ) watery eyes Ears, nose, mouth, throat, and face: ( - ) mucositis, ( - ) sore throat Respiratory: ( - ) cough, ( - ) dyspnea, ( - ) wheezes Cardiovascular: ( - ) palpitation, ( - ) chest discomfort, ( - ) lower extremity swelling Gastrointestinal:  ( - ) nausea, ( - ) heartburn, ( - ) change in bowel habits Skin: ( - ) abnormal skin rashes Lymphatics: ( - ) new lymphadenopathy, ( - ) easy bruising Neurological: ( - ) numbness, ( - ) tingling, ( - ) new weaknesses Behavioral/Psych: ( - ) mood change, ( - ) new changes  All other systems were reviewed with the patient and are negative.  PHYSICAL EXAMINATION:  Vitals:   05/18/23 0842  BP: 117/76  Pulse: 78  Resp: 15  Temp: 98.1 F (36.7 C)  SpO2: 97%    Filed Weights   05/18/23 0842  Weight: 252 lb 8 oz (114.5 kg)     GENERAL: well-appearing middle-aged Caucasian male, alert, no  distress and comfortable SKIN: skin color, texture, turgor are normal, no rashes or significant lesions EYES: conjunctiva are pink and non-injected, sclera clear LUNGS: clear to auscultation and percussion with normal breathing effort HEART: regular rate & rhythm and no murmurs and no lower extremity edema Musculoskeletal: no cyanosis of digits and no clubbing  PSYCH: alert & oriented x 3, fluent speech NEURO: no focal motor/sensory deficits  LABORATORY DATA:  I have reviewed the data as listed    Latest Ref Rng & Units  05/18/2023    8:12 AM 04/27/2023    9:12 AM 04/13/2023    9:27 AM  CBC  WBC 4.0 - 10.5 K/uL 5.1  7.5  7.5   Hemoglobin 13.0 - 17.0 g/dL 16.1  09.6  04.5   Hematocrit 39.0 - 52.0 % 41.5  41.1  43.1   Platelets 150 - 400 K/uL 165  131  172        Latest Ref Rng & Units 05/18/2023    8:12 AM 04/27/2023    9:12 AM 04/13/2023    9:27 AM  CMP  Glucose 70 - 99 mg/dL 409  89  811   BUN 6 - 20 mg/dL 18  21  19    Creatinine 0.61 - 1.24 mg/dL 9.14  7.82  9.56   Sodium 135 - 145 mmol/L 140  140  140   Potassium 3.5 - 5.1 mmol/L 4.2  4.3  3.9   Chloride 98 - 111 mmol/L 108  107  105   CO2 22 - 32 mmol/L 26  27  28    Calcium 8.9 - 10.3 mg/dL 8.7  8.6  8.6   Total Protein 6.5 - 8.1 g/dL 6.8  6.9  6.3   Total Bilirubin 0.3 - 1.2 mg/dL 0.7  0.8  0.7   Alkaline Phos 38 - 126 U/L 67  65  53   AST 15 - 41 U/L 17  14  11    ALT 0 - 44 U/L 27  26  26      RADIOGRAPHIC STUDIES: No results found.  ASSESSMENT & PLAN Mark Duarte 42 y.o. male with medical history significant for autoimmune thrombocytopenia/neutropenia who presents for a follow up visit.   # Autoimmune Thrombocytopenia/Neutropenia -- Labs today show white blood cell count 5.1, hemoglobin 14.6, MCV 87.9, and platelets 165 --Responded well to Dex 40 mg x 4 days in April 2024.  Could consider repeating this if necessary. -- findings are consistent with autoimmune destruction of platelets/neutrophils. -- RTC 3 months  time for labs and 6 months for clinic visit   No orders of the defined types were placed in this encounter.   All questions were answered. The patient knows to call the clinic with any problems, questions or concerns.  A total of more than 30 minutes were spent on this encounter with face-to-face time and non-face-to-face time, including preparing to see the patient, ordering tests and/or medications, counseling the patient and coordination of care as outlined above.   Ulysees Barns, MD Department of Hematology/Oncology [facility] Phone: [phone] Pager: [phone] Email: [email or web address]  05/18/2023 9:11 AM

## 2023-05-19 ENCOUNTER — Telehealth: Payer: Self-pay | Admitting: Hematology and Oncology

## 2023-07-07 ENCOUNTER — Inpatient Hospital Stay: Payer: BC Managed Care – PPO | Admitting: Hematology and Oncology

## 2023-07-07 ENCOUNTER — Inpatient Hospital Stay: Payer: BC Managed Care – PPO

## 2023-07-17 DIAGNOSIS — J0100 Acute maxillary sinusitis, unspecified: Secondary | ICD-10-CM | POA: Diagnosis not present

## 2023-08-17 ENCOUNTER — Inpatient Hospital Stay: Payer: BC Managed Care – PPO

## 2023-08-17 ENCOUNTER — Other Ambulatory Visit: Payer: Self-pay | Admitting: Hematology and Oncology

## 2023-08-17 DIAGNOSIS — D696 Thrombocytopenia, unspecified: Secondary | ICD-10-CM

## 2023-08-17 DIAGNOSIS — D709 Neutropenia, unspecified: Secondary | ICD-10-CM

## 2023-09-27 DIAGNOSIS — D696 Thrombocytopenia, unspecified: Secondary | ICD-10-CM | POA: Diagnosis not present

## 2023-09-28 ENCOUNTER — Telehealth: Payer: Self-pay | Admitting: Hematology and Oncology

## 2023-09-28 ENCOUNTER — Telehealth: Payer: Self-pay

## 2023-09-28 NOTE — Telephone Encounter (Signed)
Pt LVM stating that his "numbers are low" according to his last PCP visit. The plt count from PCP was 50k. This RN discussed results with Dr. Leonides Schanz.   This RN called pt back to inform him that he does not currently require an IV infusion but does need to come in for a lab appt tomorrow. Per Dr. Leonides Schanz, if his labs are low tomorrow from his appt, then he will prescribe pt dexamethasone and recheck his labs next week.   A message has been sent to scheduling to call pt and schedule a lab appt. This RN provided call back number [phone] to pt.

## 2023-09-29 ENCOUNTER — Inpatient Hospital Stay: Payer: BC Managed Care – PPO | Attending: Hematology and Oncology

## 2023-09-29 DIAGNOSIS — D696 Thrombocytopenia, unspecified: Secondary | ICD-10-CM | POA: Insufficient documentation

## 2023-09-29 DIAGNOSIS — D709 Neutropenia, unspecified: Secondary | ICD-10-CM

## 2023-09-29 LAB — CMP (CANCER CENTER ONLY)
ALT: 32 U/L (ref 0–44)
AST: 22 U/L (ref 15–41)
Albumin: 4.5 g/dL (ref 3.5–5.0)
Alkaline Phosphatase: 63 U/L (ref 38–126)
Anion gap: 11 (ref 5–15)
BUN: 25 mg/dL — ABNORMAL HIGH (ref 6–20)
CO2: 24 mmol/L (ref 22–32)
Calcium: 9.1 mg/dL (ref 8.9–10.3)
Chloride: 103 mmol/L (ref 98–111)
Creatinine: 1.15 mg/dL (ref 0.61–1.24)
GFR, Estimated: >60 mL/min (ref >60)
Glucose, Bld: 101 mg/dL — ABNORMAL HIGH (ref 70–99)
Potassium: 3.9 mmol/L (ref 3.5–5.1)
Sodium: 138 mmol/L (ref 135–145)
Total Bilirubin: 0.9 mg/dL (ref 0.3–1.2)
Total Protein: 7.5 g/dL (ref 6.5–8.1)

## 2023-09-29 LAB — CBC WITH DIFFERENTIAL (CANCER CENTER ONLY)
Abs Immature Granulocytes: 0.01 K/uL (ref 0.00–0.07)
Basophils Absolute: 0 K/uL (ref 0.0–0.1)
Basophils Relative: 0 %
Eosinophils Absolute: 0 K/uL (ref 0.0–0.5)
Eosinophils Relative: 0 %
HCT: 43.4 % (ref 39.0–52.0)
Hemoglobin: 15.2 g/dL (ref 13.0–17.0)
Immature Granulocytes: 0 %
Lymphocytes Relative: 63 %
Lymphs Abs: 2.1 K/uL (ref 0.7–4.0)
MCH: 30.5 pg (ref 26.0–34.0)
MCHC: 35 g/dL (ref 30.0–36.0)
MCV: 87.1 fL (ref 80.0–100.0)
Monocytes Absolute: 0.6 K/uL (ref 0.1–1.0)
Monocytes Relative: 17 %
Neutro Abs: 0.7 K/uL — ABNORMAL LOW (ref 1.7–7.7)
Neutrophils Relative %: 20 %
Platelet Count: 48 K/uL — ABNORMAL LOW (ref 150–400)
RBC: 4.98 MIL/uL (ref 4.22–5.81)
RDW: 13 % (ref 11.5–15.5)
WBC Count: 3.4 K/uL — ABNORMAL LOW (ref 4.0–10.5)
nRBC: 0 % (ref 0.0–0.2)

## 2023-10-02 ENCOUNTER — Other Ambulatory Visit (HOSPITAL_COMMUNITY): Payer: Self-pay

## 2023-10-02 ENCOUNTER — Other Ambulatory Visit: Payer: Self-pay

## 2023-10-02 ENCOUNTER — Telehealth: Payer: Self-pay

## 2023-10-02 MED ORDER — DEXAMETHASONE 6 MG PO TABS
42.0000 mg | ORAL_TABLET | Freq: Every day | ORAL | 0 refills | Status: AC
  Filled 2023-10-02: qty 28, 4d supply, fill #0

## 2023-10-02 NOTE — Telephone Encounter (Signed)
Pt called to follow-up with Dr. Leonides Schanz regarding recent lab results from 09/29/23 with plt 48. Per Dr. Leonides Schanz, this RN informed pt that he is to be started on Dexamethasone 42 mg, 7 tablets for 4 days and then to come in for labs. Pt informed that a lab appt would be made for Friday 10/18 to see how the Dexamethasone is working. Pt informed that he can pick up the medication from [facility] and start it today. Pt verbalized understanding.

## 2023-10-06 ENCOUNTER — Telehealth: Payer: Self-pay

## 2023-10-06 ENCOUNTER — Inpatient Hospital Stay: Payer: BC Managed Care – PPO

## 2023-10-06 ENCOUNTER — Other Ambulatory Visit: Payer: Self-pay | Admitting: Hematology and Oncology

## 2023-10-06 DIAGNOSIS — D696 Thrombocytopenia, unspecified: Secondary | ICD-10-CM

## 2023-10-06 LAB — CMP (CANCER CENTER ONLY)
ALT: 29 U/L (ref 0–44)
AST: 11 U/L — ABNORMAL LOW (ref 15–41)
Albumin: 4.3 g/dL (ref 3.5–5.0)
Alkaline Phosphatase: 48 U/L (ref 38–126)
Anion gap: 8 (ref 5–15)
BUN: 32 mg/dL — ABNORMAL HIGH (ref 6–20)
CO2: 26 mmol/L (ref 22–32)
Calcium: 9.3 mg/dL (ref 8.9–10.3)
Chloride: 107 mmol/L (ref 98–111)
Creatinine: 1.31 mg/dL — ABNORMAL HIGH (ref 0.61–1.24)
GFR, Estimated: >60 mL/min (ref >60)
Glucose, Bld: 112 mg/dL — ABNORMAL HIGH (ref 70–99)
Potassium: 3.9 mmol/L (ref 3.5–5.1)
Sodium: 141 mmol/L (ref 135–145)
Total Bilirubin: 0.6 mg/dL (ref 0.3–1.2)
Total Protein: 6.6 g/dL (ref 6.5–8.1)

## 2023-10-06 LAB — CBC WITH DIFFERENTIAL (CANCER CENTER ONLY)
Abs Immature Granulocytes: 0.23 10*3/uL — ABNORMAL HIGH (ref 0.00–0.07)
Basophils Absolute: 0.1 10*3/uL (ref 0.0–0.1)
Basophils Relative: 1 %
Eosinophils Absolute: 0 10*3/uL (ref 0.0–0.5)
Eosinophils Relative: 0 %
HCT: 39.5 % (ref 39.0–52.0)
Hemoglobin: 14.6 g/dL (ref 13.0–17.0)
Immature Granulocytes: 3 %
Lymphocytes Relative: 26 %
Lymphs Abs: 2.4 10*3/uL (ref 0.7–4.0)
MCH: 31.7 pg (ref 26.0–34.0)
MCHC: 37 g/dL — ABNORMAL HIGH (ref 30.0–36.0)
MCV: 85.9 fL (ref 80.0–100.0)
Monocytes Absolute: 1.1 10*3/uL — ABNORMAL HIGH (ref 0.1–1.0)
Monocytes Relative: 12 %
Neutro Abs: 5.4 10*3/uL (ref 1.7–7.7)
Neutrophils Relative %: 58 %
Platelet Count: 156 10*3/uL (ref 150–400)
RBC: 4.6 MIL/uL (ref 4.22–5.81)
RDW: 13 % (ref 11.5–15.5)
WBC Count: 9.2 10*3/uL (ref 4.0–10.5)
nRBC: 0 % (ref 0.0–0.2)

## 2023-10-06 LAB — IMMATURE PLATELET FRACTION: Immature Platelet Fraction: 2.8 % (ref 1.2–8.6)

## 2023-10-06 NOTE — Telephone Encounter (Signed)
The below information was relayed to pt, per Dr. Leonides Schanz. Pt verbalized understanding.

## 2023-10-06 NOTE — Telephone Encounter (Signed)
-----   Message from Ulysees Barns IV sent at 10/06/2023 10:42 AM EDT ----- Please let Mr. Eichner know that his Plt have rebounded to 158. Please schedule another lab visit for him in 2 weeks and we'll see him back in November 2024 as scheduled. ----- Message ----- From: Leory Plowman, Lab In [place] Sent: 10/06/2023  10:35 AM EDT To: Jaci Standard, MD

## 2023-10-19 ENCOUNTER — Other Ambulatory Visit: Payer: Self-pay | Admitting: Hematology and Oncology

## 2023-10-19 DIAGNOSIS — D696 Thrombocytopenia, unspecified: Secondary | ICD-10-CM

## 2023-10-20 ENCOUNTER — Inpatient Hospital Stay: Payer: BC Managed Care – PPO | Attending: Hematology and Oncology

## 2023-10-20 DIAGNOSIS — D696 Thrombocytopenia, unspecified: Secondary | ICD-10-CM | POA: Insufficient documentation

## 2023-10-20 DIAGNOSIS — D709 Neutropenia, unspecified: Secondary | ICD-10-CM | POA: Diagnosis not present

## 2023-10-20 LAB — CBC WITH DIFFERENTIAL (CANCER CENTER ONLY)
Abs Immature Granulocytes: 0.01 10*3/uL (ref 0.00–0.07)
Basophils Absolute: 0 10*3/uL (ref 0.0–0.1)
Basophils Relative: 0 %
Eosinophils Absolute: 0 10*3/uL (ref 0.0–0.5)
Eosinophils Relative: 0 %
HCT: 39.2 % (ref 39.0–52.0)
Hemoglobin: 14.4 g/dL (ref 13.0–17.0)
Immature Granulocytes: 0 %
Lymphocytes Relative: 32 %
Lymphs Abs: 1.4 10*3/uL (ref 0.7–4.0)
MCH: 31.7 pg (ref 26.0–34.0)
MCHC: 36.7 g/dL — ABNORMAL HIGH (ref 30.0–36.0)
MCV: 86.3 fL (ref 80.0–100.0)
Monocytes Absolute: 0.4 10*3/uL (ref 0.1–1.0)
Monocytes Relative: 9 %
Neutro Abs: 2.6 10*3/uL (ref 1.7–7.7)
Neutrophils Relative %: 59 %
Platelet Count: 92 10*3/uL — ABNORMAL LOW (ref 150–400)
RBC: 4.54 MIL/uL (ref 4.22–5.81)
RDW: 13 % (ref 11.5–15.5)
WBC Count: 4.5 10*3/uL (ref 4.0–10.5)
nRBC: 0 % (ref 0.0–0.2)

## 2023-10-20 LAB — CMP (CANCER CENTER ONLY)
ALT: 24 U/L (ref 0–44)
AST: 11 U/L — ABNORMAL LOW (ref 15–41)
Albumin: 4.1 g/dL (ref 3.5–5.0)
Alkaline Phosphatase: 56 U/L (ref 38–126)
Anion gap: 5 (ref 5–15)
BUN: 18 mg/dL (ref 6–20)
CO2: 25 mmol/L (ref 22–32)
Calcium: 8.8 mg/dL — ABNORMAL LOW (ref 8.9–10.3)
Chloride: 109 mmol/L (ref 98–111)
Creatinine: 1.01 mg/dL (ref 0.61–1.24)
GFR, Estimated: >60 mL/min (ref >60)
Glucose, Bld: 130 mg/dL — ABNORMAL HIGH (ref 70–99)
Potassium: 4.1 mmol/L (ref 3.5–5.1)
Sodium: 139 mmol/L (ref 135–145)
Total Bilirubin: 0.5 mg/dL (ref 0.3–1.2)
Total Protein: 6.7 g/dL (ref 6.5–8.1)

## 2023-10-20 LAB — IMMATURE PLATELET FRACTION: Immature Platelet Fraction: 2.8 % (ref 1.2–8.6)

## 2023-11-09 ENCOUNTER — Inpatient Hospital Stay: Payer: BC Managed Care – PPO

## 2023-11-09 ENCOUNTER — Inpatient Hospital Stay (HOSPITAL_BASED_OUTPATIENT_CLINIC_OR_DEPARTMENT_OTHER): Payer: BC Managed Care – PPO | Admitting: Hematology and Oncology

## 2023-11-09 ENCOUNTER — Other Ambulatory Visit: Payer: Self-pay | Admitting: Hematology and Oncology

## 2023-11-09 VITALS — BP 118/67 | HR 81 | Temp 98.2°F | Resp 16 | Wt 252.9 lb

## 2023-11-09 DIAGNOSIS — D696 Thrombocytopenia, unspecified: Secondary | ICD-10-CM

## 2023-11-09 DIAGNOSIS — D709 Neutropenia, unspecified: Secondary | ICD-10-CM | POA: Diagnosis not present

## 2023-11-09 LAB — CMP (CANCER CENTER ONLY)
ALT: 27 U/L (ref 0–44)
AST: 17 U/L (ref 15–41)
Albumin: 4.3 g/dL (ref 3.5–5.0)
Alkaline Phosphatase: 64 U/L (ref 38–126)
Anion gap: 5 (ref 5–15)
BUN: 18 mg/dL (ref 6–20)
CO2: 28 mmol/L (ref 22–32)
Calcium: 9.2 mg/dL (ref 8.9–10.3)
Chloride: 105 mmol/L (ref 98–111)
Creatinine: 1.12 mg/dL (ref 0.61–1.24)
GFR, Estimated: >60 mL/min (ref >60)
Glucose, Bld: 121 mg/dL — ABNORMAL HIGH (ref 70–99)
Potassium: 4 mmol/L (ref 3.5–5.1)
Sodium: 138 mmol/L (ref 135–145)
Total Bilirubin: 0.7 mg/dL (ref <1.2)
Total Protein: 6.7 g/dL (ref 6.5–8.1)

## 2023-11-09 LAB — CBC WITH DIFFERENTIAL (CANCER CENTER ONLY)
Abs Immature Granulocytes: 0.01 10*3/uL (ref 0.00–0.07)
Basophils Absolute: 0 10*3/uL (ref 0.0–0.1)
Basophils Relative: 1 %
Eosinophils Absolute: 0 10*3/uL (ref 0.0–0.5)
Eosinophils Relative: 0 %
HCT: 41.5 % (ref 39.0–52.0)
Hemoglobin: 15 g/dL (ref 13.0–17.0)
Immature Granulocytes: 0 %
Lymphocytes Relative: 50 %
Lymphs Abs: 1.9 10*3/uL (ref 0.7–4.0)
MCH: 31.6 pg (ref 26.0–34.0)
MCHC: 36.1 g/dL — ABNORMAL HIGH (ref 30.0–36.0)
MCV: 87.6 fL (ref 80.0–100.0)
Monocytes Absolute: 0.5 10*3/uL (ref 0.1–1.0)
Monocytes Relative: 14 %
Neutro Abs: 1.3 10*3/uL — ABNORMAL LOW (ref 1.7–7.7)
Neutrophils Relative %: 35 %
Platelet Count: 118 10*3/uL — ABNORMAL LOW (ref 150–400)
RBC: 4.74 MIL/uL (ref 4.22–5.81)
RDW: 13 % (ref 11.5–15.5)
WBC Count: 3.8 10*3/uL — ABNORMAL LOW (ref 4.0–10.5)
nRBC: 0 % (ref 0.0–0.2)

## 2023-11-09 NOTE — Progress Notes (Signed)
[facility] [phone]   Fax:([phone]) [phone]  PROGRESS NOTE  Patient Care Team: Soundra Pilon, FNP as PCP - General (Family Medicine)  Hematological/Oncological History # Autoimmune Thrombocytopenia/Neutropenia 04/01/2022: platelet transfusion for Plt of 8, WBC 2.2.  04/29/2022: bone marrow biopsy showed no evidence of underlying bone marrow disorder 05/31/2022: steroid taper that continued until Sept 2023.  10/25/2022: last visit with Dr. Clelia Croft 03/29/2023: transition care to Dr. Leonides Schanz  04/07/2023: Dex 40 mg x 4 days pulse. Patient's counts responded.   Interval History:  Mark Duarte 42 y.o. male with medical history significant for autoimmune thrombocytopenia/neutropenia who presents for a follow up visit. The patient's last visit was on 05/18/2023. In the interim since the last visit he has had no major changes in his health.  On exam today Mr. Mark Duarte is accompanied by his wife.  He reports he has been well overall and interim since her last visit.  He reports that his wife and daughter had COVID but he managed to "dodge it".  He notes he did have bruising not too long ago which is when he was found to have low platelets.  He reports that the Dex 40 mg x 4 days did have a lot of side effects.  He was quite tired and had a low energy.  He reports that he put "strain on my body".  He reports that as soon as the steroid stopped he felt immediately better.  He has not had any issues with bleeding, bruising, or dark stools.  He denies any petechiae.  He notes that if he had to in the future he be okay with retrying dexamethasone 40 mg x 4 doses.  He was also interested in long-acting medications to boost his platelet count and we discussed Promacta and the indications for that medication.  He otherwise denies any fevers, chills, sweats, nausea, vomiting or diarrhea.  A full 10 point ROS is otherwise negative.  MEDICAL HISTORY:  Past Medical History:  Diagnosis Date    Allergy    Asthma     SURGICAL HISTORY: Past Surgical History:  Procedure Laterality Date   CYST REMOVAL TRUNK      SOCIAL HISTORY: Social History   Socioeconomic History   Marital status: Single    Spouse name: Not on file   Number of children: Not on file   Years of education: Not on file   Highest education level: Not on file  Occupational History   Not on file  Tobacco Use   Smoking status: Former    Current packs/day: 0.00    Types: Cigarettes    Quit date: 10/11/2013    Years since quitting: 10.0   Smokeless tobacco: Never  Vaping Use   Vaping status: Never Used  Substance and Sexual Activity   Alcohol use: Yes    Alcohol/week: 5.0 standard drinks of alcohol    Types: 5 Standard drinks or equivalent per week    Comment: 2 beers/day 5 days a week   Drug use: No   Sexual activity: Not on file  Other Topics Concern   Not on file  Social History Narrative   Not on file   Social Determinants of Health   Financial Resource Strain: Not on file  Food Insecurity: Not on file  Transportation Needs: Not on file  Physical Activity: Not on file  Stress: Not on file  Social Connections: Not on file  Intimate Partner Violence: Not on file    FAMILY HISTORY: Family  History  Problem Relation Age of Onset   Diabetes Mother    Hypertension Mother    High Cholesterol Mother    Hypertension Father    High Cholesterol Father    Cancer Maternal Grandmother    Heart disease Maternal Grandfather    Diabetes Maternal Grandfather    High Cholesterol Maternal Grandfather    Stroke Maternal Grandfather    Mental illness Maternal Grandfather     ALLERGIES:  has No Known Allergies.  MEDICATIONS:  Current Outpatient Medications  Medication Sig Dispense Refill   albuterol (VENTOLIN HFA) 108 (90 Base) MCG/ACT inhaler Inhale 2 puffs into the lungs every 4 (four) hours as needed for wheezing or shortness of breath (cough, shortness of breath or wheezing.). 1 each 1    azithromycin (ZITHROMAX Z-PAK) 250 MG tablet Take 2 tablets (500 mg) on day one, then one tablet (250 mg) for the next four days 6 each 0   benzonatate (TESSALON) 100 MG capsule Take 1 capsule (100 mg total) by mouth every 8 (eight) hours. (Patient taking differently: Take 100 mg by mouth 3 (three) times daily as needed for cough.) 21 capsule 0   dexamethasone (DECADRON) 4 MG tablet Take 10 tablets (40 mg total) by mouth every morning with food for 4 days 40 tablet 0   fexofenadine (ALLEGRA) 180 MG tablet Take 180 mg by mouth daily as needed for allergies or rhinitis.     fluticasone (FLONASE) 50 MCG/ACT nasal spray Place 2 sprays into both nostrils daily. (Patient taking differently: Place 2 sprays into both nostrils daily as needed for allergies or rhinitis.) 1 g 0   HYDROMET 5-1.5 MG/5ML syrup Take 5 mLs by mouth at bedtime as needed for cough.     predniSONE (DELTASONE) 20 MG tablet Take 2.5 tablets (50 mg total) by mouth daily with breakfast. 90 tablet 1   Probiotic Product (PROBIOTIC GUMMIES) 30 MG CHEW Chew 30 g/day by mouth every morning. Taking two po daily     No current facility-administered medications for this visit.    REVIEW OF SYSTEMS:   Constitutional: ( - ) fevers, ( - )  chills , ( - ) night sweats Eyes: ( - ) blurriness of vision, ( - ) double vision, ( - ) watery eyes Ears, nose, mouth, throat, and face: ( - ) mucositis, ( - ) sore throat Respiratory: ( - ) cough, ( - ) dyspnea, ( - ) wheezes Cardiovascular: ( - ) palpitation, ( - ) chest discomfort, ( - ) lower extremity swelling Gastrointestinal:  ( - ) nausea, ( - ) heartburn, ( - ) change in bowel habits Skin: ( - ) abnormal skin rashes Lymphatics: ( - ) new lymphadenopathy, ( - ) easy bruising Neurological: ( - ) numbness, ( - ) tingling, ( - ) new weaknesses Behavioral/Psych: ( - ) mood change, ( - ) new changes  All other systems were reviewed with the patient and are negative.  PHYSICAL EXAMINATION:  Vitals:    11/09/23 0941  BP: 118/67  Pulse: 81  Resp: 16  Temp: 98.2 F (36.8 C)  SpO2: 96%     Filed Weights   11/09/23 0941  Weight: 252 lb 14.4 oz (114.7 kg)      GENERAL: well-appearing middle-aged Caucasian male, alert, no distress and comfortable SKIN: skin color, texture, turgor are normal, no rashes or significant lesions EYES: conjunctiva are pink and non-injected, sclera clear LUNGS: clear to auscultation and percussion with normal breathing effort HEART: regular rate &  rhythm and no murmurs and no lower extremity edema Musculoskeletal: no cyanosis of digits and no clubbing  PSYCH: alert & oriented x 3, fluent speech NEURO: no focal motor/sensory deficits  LABORATORY DATA:  I have reviewed the data as listed    Latest Ref Rng & Units 11/09/2023    9:12 AM 10/20/2023   10:46 AM 10/06/2023   10:27 AM  CBC  WBC 4.0 - 10.5 K/uL 3.8  4.5  9.2   Hemoglobin 13.0 - 17.0 g/dL 09.8  11.9  14.7   Hematocrit 39.0 - 52.0 % 41.5  39.2  39.5   Platelets 150 - 400 K/uL 118  92  156        Latest Ref Rng & Units 11/09/2023    9:12 AM 10/20/2023   10:46 AM 10/06/2023   10:27 AM  CMP  Glucose 70 - 99 mg/dL 829  562  130   BUN 6 - 20 mg/dL 18  18  32   Creatinine 0.61 - 1.24 mg/dL 8.65  7.84  6.96   Sodium 135 - 145 mmol/L 138  139  141   Potassium 3.5 - 5.1 mmol/L 4.0  4.1  3.9   Chloride 98 - 111 mmol/L 105  109  107   CO2 22 - 32 mmol/L 28  25  26    Calcium 8.9 - 10.3 mg/dL 9.2  8.8  9.3   Total Protein 6.5 - 8.1 g/dL 6.7  6.7  6.6   Total Bilirubin <1.2 mg/dL 0.7  0.5  0.6   Alkaline Phos 38 - 126 U/L 64  56  48   AST 15 - 41 U/L 17  11  11    ALT 0 - 44 U/L 27  24  29      RADIOGRAPHIC STUDIES: No results found.  ASSESSMENT & PLAN DEFOREST FLOERKE 42 y.o. male with medical history significant for autoimmune thrombocytopenia/neutropenia who presents for a follow up visit.   # Autoimmune Thrombocytopenia/Neutropenia -- Labs today show white blood cell count 3.8, Hgb  15.0, MCV 87.6, Plt 118 --Responded well to Dex 40 mg x 4 days in April 2024.  Could consider repeating this if necessary. -- findings are consistent with autoimmune destruction of platelets/neutrophils. -- RTC in 6 months for clinic visit and labs.  Will get his 49-month interval labs with his PCP as this is less expensive.  No orders of the defined types were placed in this encounter.   All questions were answered. The patient knows to call the clinic with any problems, questions or concerns.  A total of more than 30 minutes were spent on this encounter with face-to-face time and non-face-to-face time, including preparing to see the patient, ordering tests and/or medications, counseling the patient and coordination of care as outlined above.   Ulysees Barns, MD Department of Hematology/Oncology [facility] at [facility] Phone: [phone] Pager: [phone] Email: [email or web address]  11/09/2023 10:16 AM

## 2023-11-14 DIAGNOSIS — J069 Acute upper respiratory infection, unspecified: Secondary | ICD-10-CM | POA: Diagnosis not present

## 2023-11-17 DIAGNOSIS — R062 Wheezing: Secondary | ICD-10-CM | POA: Diagnosis not present

## 2023-11-17 DIAGNOSIS — R051 Acute cough: Secondary | ICD-10-CM | POA: Diagnosis not present

## 2023-11-17 DIAGNOSIS — J209 Acute bronchitis, unspecified: Secondary | ICD-10-CM | POA: Diagnosis not present

## 2023-11-19 DIAGNOSIS — J189 Pneumonia, unspecified organism: Secondary | ICD-10-CM | POA: Diagnosis not present

## 2023-11-19 DIAGNOSIS — J018 Other acute sinusitis: Secondary | ICD-10-CM | POA: Diagnosis not present

## 2024-01-22 DIAGNOSIS — R0781 Pleurodynia: Secondary | ICD-10-CM | POA: Diagnosis not present

## 2024-02-19 DIAGNOSIS — M542 Cervicalgia: Secondary | ICD-10-CM | POA: Diagnosis not present

## 2024-02-19 DIAGNOSIS — D696 Thrombocytopenia, unspecified: Secondary | ICD-10-CM | POA: Diagnosis not present

## 2024-02-20 ENCOUNTER — Other Ambulatory Visit: Payer: Self-pay

## 2024-02-20 ENCOUNTER — Encounter (HOSPITAL_BASED_OUTPATIENT_CLINIC_OR_DEPARTMENT_OTHER): Payer: Self-pay | Admitting: Emergency Medicine

## 2024-02-20 ENCOUNTER — Emergency Department (HOSPITAL_BASED_OUTPATIENT_CLINIC_OR_DEPARTMENT_OTHER)
Admission: EM | Admit: 2024-02-20 | Discharge: 2024-02-20 | Disposition: A | Discharge status: Home / Self Care | Attending: Emergency Medicine | Admitting: Emergency Medicine

## 2024-02-20 ENCOUNTER — Other Ambulatory Visit (HOSPITAL_BASED_OUTPATIENT_CLINIC_OR_DEPARTMENT_OTHER): Payer: Self-pay

## 2024-02-20 ENCOUNTER — Emergency Department (HOSPITAL_BASED_OUTPATIENT_CLINIC_OR_DEPARTMENT_OTHER)

## 2024-02-20 DIAGNOSIS — J45909 Unspecified asthma, uncomplicated: Secondary | ICD-10-CM | POA: Diagnosis not present

## 2024-02-20 DIAGNOSIS — Z7951 Long term (current) use of inhaled steroids: Secondary | ICD-10-CM | POA: Insufficient documentation

## 2024-02-20 DIAGNOSIS — F17200 Nicotine dependence, unspecified, uncomplicated: Secondary | ICD-10-CM | POA: Insufficient documentation

## 2024-02-20 DIAGNOSIS — J0100 Acute maxillary sinusitis, unspecified: Secondary | ICD-10-CM | POA: Diagnosis not present

## 2024-02-20 DIAGNOSIS — G43909 Migraine, unspecified, not intractable, without status migrainosus: Secondary | ICD-10-CM | POA: Diagnosis not present

## 2024-02-20 DIAGNOSIS — R519 Headache, unspecified: Secondary | ICD-10-CM | POA: Diagnosis not present

## 2024-02-20 MED ORDER — SODIUM CHLORIDE 0.9 % IV BOLUS
1000.0000 mL | Freq: Once | INTRAVENOUS | Status: AC
  Administered 2024-02-20: 1000 mL via INTRAVENOUS

## 2024-02-20 MED ORDER — MORPHINE SULFATE (PF) 4 MG/ML IV SOLN
4.0000 mg | Freq: Once | INTRAVENOUS | Status: AC
  Administered 2024-02-20: 4 mg via INTRAVENOUS
  Filled 2024-02-20: qty 1

## 2024-02-20 MED ORDER — DIPHENHYDRAMINE HCL 50 MG/ML IJ SOLN
12.5000 mg | Freq: Once | INTRAMUSCULAR | Status: AC
  Administered 2024-02-20: 12.5 mg via INTRAVENOUS
  Filled 2024-02-20: qty 1

## 2024-02-20 MED ORDER — METOCLOPRAMIDE HCL 5 MG/ML IJ SOLN
10.0000 mg | Freq: Once | INTRAMUSCULAR | Status: AC
  Administered 2024-02-20: 10 mg via INTRAVENOUS
  Filled 2024-02-20: qty 2

## 2024-02-20 MED ORDER — LIDOCAINE 5 % EX PTCH
1.0000 | MEDICATED_PATCH | CUTANEOUS | Status: DC
  Administered 2024-02-20: 1 via TRANSDERMAL
  Filled 2024-02-20: qty 1

## 2024-02-20 MED ORDER — DEXAMETHASONE SODIUM PHOSPHATE 10 MG/ML IJ SOLN
8.0000 mg | Freq: Once | INTRAMUSCULAR | Status: AC
  Administered 2024-02-20: 8 mg via INTRAVENOUS
  Filled 2024-02-20: qty 1

## 2024-02-20 MED ORDER — AMOXICILLIN-POT CLAVULANATE 875-125 MG PO TABS
1.0000 | ORAL_TABLET | Freq: Two times a day (BID) | ORAL | 0 refills | Status: AC
Start: 2024-02-20 — End: 2024-02-27
  Filled 2024-02-20: qty 14, 7d supply, fill #0

## 2024-02-20 MED ORDER — MAGNESIUM SULFATE 2 GM/50ML IV SOLN
2.0000 g | Freq: Once | INTRAVENOUS | Status: AC
  Administered 2024-02-20: 2 g via INTRAVENOUS
  Filled 2024-02-20 (×2): qty 50

## 2024-02-20 NOTE — ED Provider Notes (Signed)
 [place] [facility]ENC[facility] DEPARTMENT AT [facility] Provider Note   CSN: [id number] Arrival date & time: 02/20/24  1015     History  Chief Complaint  Patient presents with   Headache    Mark Duarte is a 43 y.o. male with a history of ITP and asthma who presents to the ED today for a migraine. Patient reports that he began having a headache at the back of his head 7 days ago, that now radiates to the top of his head. He states that he was sitting at the bar after work a [employer] he works at when the pain started. Headache has been persistent since the onset despite taking Ibuprofen and Tylenol. This is the worst headache he has ever had. Patient saw his PCP yesterday and they gave him Hydrocodone, which only made the headache worse. Endorses associated lightheadedness, nausea, and photophobia. No additional complaints or concerns at this time.    Home Medications Prior to Admission medications   Medication Sig Start Date End Date Taking? Authorizing Provider  amoxicillin-clavulanate (AUGMENTIN) 875-125 MG tablet Take 1 tablet by mouth every 12 (twelve) hours for 7 days. 02/20/24 02/27/24 Yes Maxwell Marion, PA-C  albuterol (VENTOLIN HFA) 108 (90 Base) MCG/ACT inhaler Inhale 2 puffs into the lungs every 4 (four) hours as needed for wheezing or shortness of breath (cough, shortness of breath or wheezing.). 04/07/22   Benjiman Core, MD  azithromycin (ZITHROMAX Z-PAK) 250 MG tablet Take 2 tablets (500 mg) on day one, then one tablet (250 mg) for the next four days 05/10/22   Benjiman Core, MD  benzonatate (TESSALON) 100 MG capsule Take 1 capsule (100 mg total) by mouth every 8 (eight) hours. Patient taking differently: Take 100 mg by mouth 3 (three) times daily as needed for cough. 01/29/18   Cathie Hoops, Amy V, PA-C  dexamethasone (DECADRON) 4 MG tablet Take 10 tablets (40 mg total) by mouth every morning with food for 4 days 04/07/23   Jaci Standard, MD  fexofenadine (ALLEGRA) 180 MG  tablet Take 180 mg by mouth daily as needed for allergies or rhinitis.    [provider]  fluticasone (FLONASE) 50 MCG/ACT nasal spray Place 2 sprays into both nostrils daily. Patient taking differently: Place 2 sprays into both nostrils daily as needed for allergies or rhinitis. 01/29/18   Yu, Amy V, PA-C  HYDROMET 5-1.5 MG/5ML syrup Take 5 mLs by mouth at bedtime as needed for cough.    [provider]  predniSONE (DELTASONE) 20 MG tablet Take 2.5 tablets (50 mg total) by mouth daily with breakfast. 07/25/22   Benjiman Core, MD  Probiotic Product (PROBIOTIC GUMMIES) 30 MG CHEW Chew 30 g/day by mouth every morning. Taking two po daily    [provider]      Allergies    Patient has no known allergies.    Review of Systems   Review of Systems  Neurological:  Positive for headaches.  All other systems reviewed and are negative.   Physical Exam Updated Vital Signs BP 125/68   Pulse 75   Temp 98.2 F (36.8 C) (Oral)   Resp 18   SpO2 100%  Physical Exam Vitals and nursing note reviewed.  Constitutional:      Appearance: Normal appearance.  HENT:     Head: Normocephalic and atraumatic.     Comments: Tenderness to palpation of the maxillary sinuses    Mouth/Throat:     Mouth: Mucous membranes are moist.  Eyes:     Conjunctiva/sclera: Conjunctivae normal.     Pupils: Pupils are equal, round, and reactive to light.  Neck:     Comments: Left paravertebral tenderness to palpation without midline tenderness, step-off, or deformity Cardiovascular:     Rate and Rhythm: Normal rate and regular rhythm.     Pulses: Normal pulses.     Heart sounds: Normal heart sounds.  Pulmonary:     Effort: Pulmonary effort is normal.     Breath sounds: Normal breath sounds.  Abdominal:     Palpations: Abdomen is soft.     Tenderness: There is no abdominal tenderness.  Musculoskeletal:        General: Normal range of motion.     Cervical back: Tenderness present.   Skin:    General: Skin is warm and dry.     Findings: No rash.  Neurological:     General: No focal deficit present.     Mental Status: He is alert.     Sensory: No sensory deficit.     Motor: No weakness.  Psychiatric:        Mood and Affect: Mood normal.        Behavior: Behavior normal.    ED Results / Procedures / Treatments   Labs (all labs ordered are listed, but only abnormal results are displayed) Labs Reviewed - No data to display  EKG None  Radiology CT Head Wo Contrast Result Date: 02/20/2024 CLINICAL DATA:  43 year old male with sudden severe headache. EXAM: CT HEAD WITHOUT CONTRAST TECHNIQUE: Contiguous axial images were obtained from the base of the skull through the vertex without intravenous contrast. RADIATION DOSE REDUCTION: This exam was performed according to the departmental dose-optimization program which includes automated exposure control, adjustment of the mA and/or kV according to patient size and/or use of iterative reconstruction technique. COMPARISON:  Head CT 04/01/2022. FINDINGS: Brain: Normal cerebral volume. No midline shift, ventriculomegaly, mass effect, evidence of mass lesion, intracranial hemorrhage or evidence of cortically based acute infarction. Gray-white matter differentiation is within normal limits throughout the brain. Vascular: No suspicious intracranial vascular hyperdensity. Skull: Stable and intact.  No acute osseous abnormality identified. Sinuses/Orbits: Subtotal opacification of the left maxillary sinus with bubbly opacity there is new from last year. Other Visualized paranasal sinuses and mastoids are stable and well aerated. Tympanic cavities appear to remain clear. Other: Chronic right vertex scalp soft tissue scarring is stable on series 3, image 62. Visualized orbits and scalp soft tissues are within normal limits. IMPRESSION: 1. Stable and normal noncontrast CT appearance of the brain. 2. New left maxillary sinus inflammation since  last year. Electronically Signed   By: Odessa Fleming M.D.   On: 02/20/2024 11:33    Procedures Procedures: not indicated.   Medications Ordered in ED Medications  lidocaine (LIDODERM) 5 % 1 patch (1 patch Transdermal Patch Applied 02/20/24 1440)  sodium chloride 0.9 % bolus 1,000 mL ( Intravenous Stopped 02/20/24 1531)  morphine (PF) 4 MG/ML injection 4 mg (4 mg Intravenous Given 02/20/24 1434)  metoCLOPramide (REGLAN) injection 10 mg (10 mg Intravenous Given 02/20/24 1446)  diphenhydrAMINE (BENADRYL) injection 12.5 mg (12.5 mg Intravenous Given 02/20/24 1436)  dexamethasone (DECADRON) injection 8 mg (8 mg Intravenous Given 02/20/24 1434)  magnesium sulfate IVPB 2 g 50 mL (0 g Intravenous Stopped 02/20/24 1541)    ED Course/ Medical Decision Making/ A&P  Medical Decision Making Amount and/or Complexity of Data Reviewed Radiology: ordered.  Risk Prescription drug management.   This patient presents to the ED for concern of headache, this involves an extensive number of treatment options, and is a complaint that carries with it a high risk of complications and morbidity.   Differential diagnosis includes: SAH, SDH, EDH, tension headache, cluster headache, sinusitis, migraine, atypical migraine, etc.   Comorbidities  See HPI above   Additional History  Additional history obtained from prior records   Imaging Studies  I ordered imaging studies including CT head  I independently visualized and interpreted imaging which showed:  Stable and normal non-contrast brain CT. New left maxillary sinus inflammation. I agree with the radiologist interpretation   Problem List / ED Course / Critical Interventions / Medication Management  Patient presents the ED today with headache for the past week.  Started at the occipital region and now radiates to the crown of his head.  Additionally endorses pain bilateral maxillary sinuses.  Denies vision changes, weakness, or  head injuries prior to onset of symptoms.  Has had some associated nausea and lightheadedness. I ordered medications including: Morphine, Reglan, Benadryl, Decadron, Magnesium, and NS for migraine Lidocaine patch for neck pain  Reevaluation of the patient after these medicines showed that the patient improved. I have reviewed the patients home medicines and have made adjustments as needed   Social Determinants of Health  Tobacco use   Test / Admission - Considered  Discussed findings with patient.  All questions were answered. He is stable and safe discharge home. Return precautions given.       Final Clinical Impression(s) / ED Diagnoses Final diagnoses:  Migraine without status migrainosus, not intractable, unspecified migraine type  Acute maxillary sinusitis, recurrence not specified    Rx / DC Orders ED Discharge Orders          Ordered    amoxicillin-clavulanate (AUGMENTIN) 875-125 MG tablet  Every 12 hours        02/20/24 1614              Maxwell Marion, PA-C 02/20/24 1617    Margarita Grizzle, MD 02/24/24 1054

## 2024-02-20 NOTE — ED Triage Notes (Signed)
 C/o posterior head/neck pain since Wednesday. States OTC meds provide no relief. No hx of migraines. Saw PCP yesterday and was given hydrocodone but states pain was worse after meds. Photosensitive.

## 2024-02-20 NOTE — Discharge Instructions (Addendum)
 As discussed, your imaging is reassuring. There are no signs of intracranial abnormalities causing the migraine. It did show signs of a sinus infection so we will treat you with Augmentin. Take this medication twice a day for the next week.   Follow up with your PCP in the next 5 days for reevaluation of your symptoms.  Get help right away if: Your migraine headache gets very bad. Your migraine headache lasts more than 72 hours. You have a fever or stiff neck. You have trouble seeing. Your muscles feel weak or like you cannot control them. You lose your balance a lot. You have trouble walking. You faint. You have a seizure.

## 2024-02-21 ENCOUNTER — Encounter: Payer: Self-pay | Admitting: Family Medicine

## 2024-02-23 ENCOUNTER — Telehealth: Payer: Self-pay | Admitting: *Deleted

## 2024-02-23 NOTE — Telephone Encounter (Signed)
 Received vm message from pt inquiring about labs he had done @ urgent care. His platelts were 84k. He wanted to know if he needs to see Dr. Leonides Schanz sooner than his May 2025 appt. Attempted call back. No answer. Able to leave vm message. Advised that I would have Dr. Leonides Schanz review his lab results when he is back in the office next week and will call him back at that time. Advised that his platelets are dangerously low at this time. Advised to call back with any questions or concerns. If he develops any bleeding he should go to the ED.

## 2024-04-04 DIAGNOSIS — Z6838 Body mass index (BMI) 38.0-38.9, adult: Secondary | ICD-10-CM | POA: Diagnosis not present

## 2024-04-04 DIAGNOSIS — M79672 Pain in left foot: Secondary | ICD-10-CM | POA: Diagnosis not present

## 2024-04-04 DIAGNOSIS — R0781 Pleurodynia: Secondary | ICD-10-CM | POA: Diagnosis not present

## 2024-04-04 DIAGNOSIS — T148XXA Other injury of unspecified body region, initial encounter: Secondary | ICD-10-CM | POA: Diagnosis not present

## 2024-05-03 ENCOUNTER — Ambulatory Visit
Admission: RE | Admit: 2024-05-03 | Discharge: 2024-05-03 | Disposition: A | Discharge status: Home / Self Care | Source: Ambulatory Visit | Attending: Family Medicine | Admitting: Family Medicine

## 2024-05-03 ENCOUNTER — Other Ambulatory Visit: Payer: Self-pay | Admitting: Family Medicine

## 2024-05-03 DIAGNOSIS — T148XXA Other injury of unspecified body region, initial encounter: Secondary | ICD-10-CM

## 2024-05-03 DIAGNOSIS — M7989 Other specified soft tissue disorders: Secondary | ICD-10-CM | POA: Diagnosis not present

## 2024-05-03 DIAGNOSIS — M25571 Pain in right ankle and joints of right foot: Secondary | ICD-10-CM | POA: Diagnosis not present

## 2024-05-09 ENCOUNTER — Inpatient Hospital Stay (HOSPITAL_COMMUNITY)
Admission: RE | Admit: 2024-05-09 | Discharge: 2024-05-11 | DRG: 813 | Disposition: A | Discharge status: Home / Self Care | Source: Ambulatory Visit | Attending: Family Medicine | Admitting: Family Medicine

## 2024-05-09 ENCOUNTER — Other Ambulatory Visit: Payer: Self-pay | Admitting: Hematology and Oncology

## 2024-05-09 ENCOUNTER — Encounter (HOSPITAL_COMMUNITY): Payer: Self-pay

## 2024-05-09 ENCOUNTER — Inpatient Hospital Stay (HOSPITAL_BASED_OUTPATIENT_CLINIC_OR_DEPARTMENT_OTHER): Payer: BC Managed Care – PPO | Admitting: Hematology and Oncology

## 2024-05-09 ENCOUNTER — Inpatient Hospital Stay: Payer: BC Managed Care – PPO | Attending: Hematology and Oncology

## 2024-05-09 VITALS — BP 128/68 | HR 76 | Temp 97.2°F | Resp 13 | Wt 248.5 lb

## 2024-05-09 DIAGNOSIS — Z8249 Family history of ischemic heart disease and other diseases of the circulatory system: Secondary | ICD-10-CM | POA: Diagnosis not present

## 2024-05-09 DIAGNOSIS — Z87891 Personal history of nicotine dependence: Secondary | ICD-10-CM | POA: Insufficient documentation

## 2024-05-09 DIAGNOSIS — Z833 Family history of diabetes mellitus: Secondary | ICD-10-CM | POA: Diagnosis not present

## 2024-05-09 DIAGNOSIS — A080 Rotaviral enteritis: Secondary | ICD-10-CM | POA: Diagnosis present

## 2024-05-09 DIAGNOSIS — D696 Thrombocytopenia, unspecified: Secondary | ICD-10-CM

## 2024-05-09 DIAGNOSIS — D693 Immune thrombocytopenic purpura: Principal | ICD-10-CM

## 2024-05-09 DIAGNOSIS — R197 Diarrhea, unspecified: Secondary | ICD-10-CM | POA: Insufficient documentation

## 2024-05-09 DIAGNOSIS — Z823 Family history of stroke: Secondary | ICD-10-CM | POA: Diagnosis not present

## 2024-05-09 DIAGNOSIS — D708 Other neutropenia: Secondary | ICD-10-CM | POA: Diagnosis present

## 2024-05-09 DIAGNOSIS — M359 Systemic involvement of connective tissue, unspecified: Secondary | ICD-10-CM | POA: Diagnosis present

## 2024-05-09 DIAGNOSIS — H1132 Conjunctival hemorrhage, left eye: Secondary | ICD-10-CM | POA: Diagnosis not present

## 2024-05-09 DIAGNOSIS — Z83438 Family history of other disorder of lipoprotein metabolism and other lipidemia: Secondary | ICD-10-CM | POA: Diagnosis not present

## 2024-05-09 DIAGNOSIS — J45909 Unspecified asthma, uncomplicated: Secondary | ICD-10-CM | POA: Diagnosis not present

## 2024-05-09 DIAGNOSIS — D709 Neutropenia, unspecified: Secondary | ICD-10-CM | POA: Diagnosis not present

## 2024-05-09 DIAGNOSIS — R112 Nausea with vomiting, unspecified: Secondary | ICD-10-CM | POA: Insufficient documentation

## 2024-05-09 LAB — CMP (CANCER CENTER ONLY)
ALT: 33 U/L (ref 0–44)
AST: 22 U/L (ref 15–41)
Albumin: 4.3 g/dL (ref 3.5–5.0)
Alkaline Phosphatase: 55 U/L (ref 38–126)
Anion gap: 5 (ref 5–15)
BUN: 16 mg/dL (ref 6–20)
CO2: 27 mmol/L (ref 22–32)
Calcium: 8.7 mg/dL — ABNORMAL LOW (ref 8.9–10.3)
Chloride: 107 mmol/L (ref 98–111)
Creatinine: 1.09 mg/dL (ref 0.61–1.24)
GFR, Estimated: >60 mL/min (ref >60)
Glucose, Bld: 90 mg/dL (ref 70–99)
Potassium: 4 mmol/L (ref 3.5–5.1)
Sodium: 139 mmol/L (ref 135–145)
Total Bilirubin: 1.2 mg/dL (ref 0.0–1.2)
Total Protein: 6.8 g/dL (ref 6.5–8.1)

## 2024-05-09 LAB — LACTATE DEHYDROGENASE: LDH: 194 U/L — ABNORMAL HIGH (ref 98–192)

## 2024-05-09 LAB — CBC WITH DIFFERENTIAL (CANCER CENTER ONLY)
Abs Immature Granulocytes: 0.01 10*3/uL (ref 0.00–0.07)
Basophils Absolute: 0 10*3/uL (ref 0.0–0.1)
Basophils Relative: 0 %
Eosinophils Absolute: 0 10*3/uL (ref 0.0–0.5)
Eosinophils Relative: 1 %
HCT: 40.7 % (ref 39.0–52.0)
Hemoglobin: 14.9 g/dL (ref 13.0–17.0)
Immature Granulocytes: 1 %
Lymphocytes Relative: 57 %
Lymphs Abs: 1.2 10*3/uL (ref 0.7–4.0)
MCH: 30.3 pg (ref 26.0–34.0)
MCHC: 36.6 g/dL — ABNORMAL HIGH (ref 30.0–36.0)
MCV: 82.9 fL (ref 80.0–100.0)
Monocytes Absolute: 0.5 10*3/uL (ref 0.1–1.0)
Monocytes Relative: 25 %
Neutro Abs: 0.3 10*3/uL — CL (ref 1.7–7.7)
Neutrophils Relative %: 16 %
Platelet Count: <5 10*3/uL — CL (ref 150–400)
RBC: 4.91 MIL/uL (ref 4.22–5.81)
RDW: 12.6 % (ref 11.5–15.5)
WBC Count: 2 10*3/uL — ABNORMAL LOW (ref 4.0–10.5)
nRBC: 0 % (ref 0.0–0.2)

## 2024-05-09 MED ORDER — ONDANSETRON HCL 4 MG/2ML IJ SOLN: 4.0000 mg | Freq: Four times a day (QID) | INTRAMUSCULAR | Status: DC | PRN

## 2024-05-09 MED ORDER — ALBUTEROL SULFATE (2.5 MG/3ML) 0.083% IN NEBU: 2.5000 mg | INHALATION_SOLUTION | RESPIRATORY_TRACT | Status: DC | PRN

## 2024-05-09 MED ORDER — TRAZODONE HCL 50 MG PO TABS
25.0000 mg | ORAL_TABLET | Freq: Every evening | ORAL | Status: DC | PRN
  Administered 2024-05-09 – 2024-05-10 (×2): 25 mg via ORAL
  Filled 2024-05-09 (×2): qty 1

## 2024-05-09 MED ORDER — ONDANSETRON HCL 4 MG PO TABS: 4.0000 mg | ORAL_TABLET | Freq: Four times a day (QID) | ORAL | Status: DC | PRN

## 2024-05-09 MED ORDER — DEXAMETHASONE 4 MG PO TABS
40.0000 mg | ORAL_TABLET | Freq: Every day | ORAL | Status: DC
  Administered 2024-05-09 – 2024-05-11 (×3): 40 mg via ORAL
  Filled 2024-05-09 (×3): qty 10

## 2024-05-09 MED ORDER — ACETAMINOPHEN 650 MG RE SUPP: 650.0000 mg | Freq: Four times a day (QID) | RECTAL | Status: DC | PRN

## 2024-05-09 MED ORDER — ACETAMINOPHEN 325 MG PO TABS: 650.0000 mg | ORAL_TABLET | Freq: Four times a day (QID) | ORAL | Status: DC | PRN

## 2024-05-09 NOTE — Progress Notes (Signed)
 [facility] [phone]   [phone]  PROGRESS NOTE  Patient Care Team: Alejandro Hurt, FNP as PCP - General (Family Medicine)  Hematological/Oncological History # Autoimmune Thrombocytopenia/Neutropenia 04/01/2022: platelet transfusion for Plt of 8, WBC 2.2.  04/29/2022: bone marrow biopsy showed no evidence of underlying bone marrow disorder 05/31/2022: steroid taper that continued until Sept 2023.  10/25/2022: last visit with Dr. Dirk Fredericks 03/29/2023: transition care to Dr. Rosaline Coma  04/07/2023: Dex 40 mg x 4 days pulse. Patient's counts responded.  05/09/2024: Plt <5 during GI illness. Admitted to hospital.   Interval History:  Mark Duarte 43 y.o. male with medical history significant for autoimmune thrombocytopenia/neutropenia who presents for a follow up visit. The patient's last visit was on 11/09/2023. In the interim since the last visit he has had no major changes in his health.  On exam today Mark Duarte reports that he has not been well interim since her last visit.  He reports he developed nausea, vomiting, and diarrhea on Monday.  He reports he ate an undercooked [place] burger and has been throwing up.  He is also been having profuse watery diarrhea.  He notes he is not been having any fevers, chills, sweats or abdominal pain.  He does report some discomfort and some "gurgling" of his stomach.  He reports otherwise no overt signs of bleeding, bruising, or dark stools.  He is not having overt blood in the stool.  He is having some soft conjunctival hemorrhage which he associates with his vomiting.  He does have some petechiae on his lower extremities.  Overall he does not feel particularly well.  Today we discussed results of his blood work and my concern that he has developed a GI infection which is triggered ITP flare.  He voiced understanding of the findings and was willing and able to be admitted for close observation, workup of his diarrhea, and  steroid pulse.  MEDICAL HISTORY:  Past Medical History:  Diagnosis Date   Allergy    Asthma     SURGICAL HISTORY: Past Surgical History:  Procedure Laterality Date   CYST REMOVAL TRUNK      SOCIAL HISTORY: Social History   Socioeconomic History   Marital status: Single    Spouse name: Not on file   Number of children: Not on file   Years of education: Not on file   Highest education level: Not on file  Occupational History   Not on file  Tobacco Use   Smoking status: Former    Current packs/day: 0.00    Types: Cigarettes    Quit date: 10/11/2013    Years since quitting: 10.5   Smokeless tobacco: Never  Vaping Use   Vaping status: Never Used  Substance and Sexual Activity   Alcohol use: Yes    Alcohol/week: 5.0 standard drinks of alcohol    Types: 5 Standard drinks or equivalent per week    Comment: 2 beers/day 5 days a week   Drug use: No   Sexual activity: Not on file  Other Topics Concern   Not on file  Social History Narrative   Not on file   Social Drivers of Health   Financial Resource Strain: Not on file  Food Insecurity: No Food Insecurity (05/09/2024)   Hunger Vital Sign    Worried About Running Out of Food in the Last Year: Never true    Ran Out of Food in the Last Year: Never true  Transportation Needs: No Transportation Needs (05/09/2024)  PRAPARE - Administrator, Civil Service (Medical): No    Lack of Transportation (Non-Medical): No  Physical Activity: Not on file  Stress: Not on file  Social Connections: Not on file  Intimate Partner Violence: Not At Risk (05/09/2024)   Humiliation, Afraid, Rape, and Kick questionnaire    Fear of Current or Ex-Partner: No    Emotionally Abused: No    Physically Abused: No    Sexually Abused: No    FAMILY HISTORY: Family History  Problem Relation Age of Onset   Diabetes Mother    Hypertension Mother    High Cholesterol Mother    Hypertension Father    High Cholesterol Father    Cancer  Maternal Grandmother    Heart disease Maternal Grandfather    Diabetes Maternal Grandfather    High Cholesterol Maternal Grandfather    Stroke Maternal Grandfather    Mental illness Maternal Grandfather     ALLERGIES:  has no known allergies.  MEDICATIONS:  No current facility-administered medications for this visit.   No current outpatient medications on file.   Facility-Administered Medications Ordered in Other Visits  Medication Dose Route Frequency Provider Last Rate Last Admin   acetaminophen  (TYLENOL ) tablet 650 mg  650 mg Oral Q6H PRN Jannette Mend, Mir M, MD       Or   acetaminophen  (TYLENOL ) suppository 650 mg  650 mg Rectal Q6H PRN Jannette Mend, Mir M, MD       albuterol  (PROVENTIL ) (2.5 MG/3ML) 0.083% nebulizer solution 2.5 mg  2.5 mg Nebulization Q2H PRN Jannette Mend, Mir M, MD       dexamethasone  (DECADRON ) tablet 40 mg  40 mg Oral Daily Jannette Mend, Mir M, MD       ondansetron (ZOFRAN) tablet 4 mg  4 mg Oral Q6H PRN Jannette Mend, Mir M, MD       Or   ondansetron (ZOFRAN) injection 4 mg  4 mg Intravenous Q6H PRN Jannette Mend, Mir M, MD       traZODone (DESYREL) tablet 25 mg  25 mg Oral QHS PRN Jannette Mend, Mir M, MD        REVIEW OF SYSTEMS:   Constitutional: ( - ) fevers, ( - )  chills , ( - ) night sweats Eyes: ( - ) blurriness of vision, ( - ) double vision, ( - ) watery eyes Ears, nose, mouth, throat, and face: ( - ) mucositis, ( - ) sore throat Respiratory: ( - ) cough, ( - ) dyspnea, ( - ) wheezes Cardiovascular: ( - ) palpitation, ( - ) chest discomfort, ( - ) lower extremity swelling Gastrointestinal:  ( - ) nausea, ( - ) heartburn, ( - ) change in bowel habits Skin: ( - ) abnormal skin rashes Lymphatics: ( - ) new lymphadenopathy, ( - ) easy bruising Neurological: ( - ) numbness, ( - ) tingling, ( - ) new weaknesses Behavioral/Psych: ( - ) mood change, ( - ) new changes  All other systems were reviewed with the patient and are negative.  PHYSICAL  EXAMINATION:  Vitals:   05/09/24 1055  BP: 128/68  Pulse: 76  Resp: 13  Temp: (!) 97.2 F (36.2 C)  SpO2: 98%      Filed Weights   05/09/24 1055  Weight: 248 lb 8 oz (112.7 kg)       GENERAL: well-appearing middle-aged Caucasian male, alert, no distress and comfortable SKIN: skin color, texture, turgor are normal, no rashes or significant lesions EYES: conjunctiva are pink and non-injected, sclera  clear LUNGS: clear to auscultation and percussion with normal breathing effort HEART: regular rate & rhythm and no murmurs and no lower extremity edema Musculoskeletal: no cyanosis of digits and no clubbing  PSYCH: alert & oriented x 3, fluent speech NEURO: no focal motor/sensory deficits  LABORATORY DATA:  I have reviewed the data as listed    Latest Ref Rng & Units 05/09/2024   10:25 AM 11/09/2023    9:12 AM 10/20/2023   10:46 AM  CBC  WBC 4.0 - 10.5 K/uL 2.0  3.8  4.5   Hemoglobin 13.0 - 17.0 g/dL 91.4  78.2  95.6   Hematocrit 39.0 - 52.0 % 40.7  41.5  39.2   Platelets 150 - 400 K/uL <5  118  92        Latest Ref Rng & Units 05/09/2024   10:25 AM 11/09/2023    9:12 AM 10/20/2023   10:46 AM  CMP  Glucose 70 - 99 mg/dL 90  213  086   BUN 6 - 20 mg/dL 16  18  18    Creatinine 0.61 - 1.24 mg/dL 5.78  4.69  6.29   Sodium 135 - 145 mmol/L 139  138  139   Potassium 3.5 - 5.1 mmol/L 4.0  4.0  4.1   Chloride 98 - 111 mmol/L 107  105  109   CO2 22 - 32 mmol/L 27  28  25    Calcium 8.9 - 10.3 mg/dL 8.7  9.2  8.8   Total Protein 6.5 - 8.1 g/dL 6.8  6.7  6.7   Total Bilirubin 0.0 - 1.2 mg/dL 1.2  0.7  0.5   Alkaline Phos 38 - 126 U/L 55  64  56   AST 15 - 41 U/L 22  17  11    ALT 0 - 44 U/L 33  27  24     RADIOGRAPHIC STUDIES: DG Ankle Complete Right Result Date: 05/04/2024 CLINICAL DATA:  Sprain. Tripped over a stool at work 04/19/2024. Anterolateral pain. EXAM: RIGHT ANKLE - COMPLETE 3+ VIEW COMPARISON:  Right ankle radiographs 01/12/2019 FINDINGS: Ankle mortise is  symmetric and intact. Mild lateral malleolar soft tissue swelling, overall decreased from remote prior radiograph. There is a 6 mm chronic ossicle distal to the fibula and lateral to the lateral talar process, unchanged. The ankle mortise is symmetric and intact. Minimal chronic enthesopathic change at the Achilles insertion on the calcaneus. No acute fracture or dislocation. IMPRESSION: Mild lateral malleolar soft tissue swelling, overall decreased from remote prior radiograph. No acute fracture. Electronically Signed   By: Bertina Broccoli M.D.   On: 05/04/2024 16:21    ASSESSMENT & PLAN JASSON SIEGMANN 43 y.o. male with medical history significant for autoimmune thrombocytopenia/neutropenia who presents for a follow up visit.   # Severe Thrombocytopenia # Diarrhea -- patient has watery diarrhea x 4 days, he suspects it is 2/2 to undercooked [place] burger --illness appears to have triggered an ITP flare, Plt <5 --recommend starting Dexamethasone  40 mg PO daily x 4 days with daily labs in house. If not successful in raising his Plt will add IVIG --do not transfuse platelets unless patient has overt bleeding -- no headache/vision changes. No evidence of GI bleeding. Patient has petechiae and subconjunctival hemorrhage. --recommend admission for diarrhea workup and monitoring of Plt count.  -- Hematology will follow in house.   # Autoimmune Thrombocytopenia/Neutropenia -- Labs today show white blood cell count 2.0, Hgb 14.9, MCV 82.9, Plt <5.  -- will start Dexamethasone   40 mg PO daily x 4 days with daily labs.  -- strict return precautions for bleeding, headache, fatigue, or shortness of breath.  --Responded well to Dex 40 mg x 4 days in April 2024.  -- findings are consistent with autoimmune destruction of platelets/neutrophils. -- RTC pending d/c from hospital.   No orders of the defined types were placed in this encounter.   All questions were answered. The patient knows to call the  clinic with any problems, questions or concerns.  A total of more than 30 minutes were spent on this encounter with face-to-face time and non-face-to-face time, including preparing to see the patient, ordering tests and/or medications, counseling the patient and coordination of care as outlined above.   Rogerio Clay, MD Department of Hematology/Oncology [facility] at [facility] Phone: [phone] Pager: [phone] Email: [email or web address]  05/09/2024 2:29 PM

## 2024-05-09 NOTE — H&P (Signed)
 History and Physical  Mark Duarte:[id number] DOB: 02-23-1981 DOA: 05/09/2024  PCP: Alejandro Hurt, FNP   Chief Complaint: Low platelets  HPI: Mark Duarte is a 43 y.o. male with medical history significant for ITP being admitted to the hospital with acute ITP.  He had a scheduled 80-month follow-up with his hematologist today, lab work reveals undetectable platelet count.  For the past approximately 4 days, he has had some nausea with vomiting, and diarrhea.  Denies any hematemesis or blood in his stool.  Has not vomited actually in 3 days, but still has ongoing watery diarrhea, has already gone 5 times this morning.  Denies any fevers or chills, no sick contacts, his wife and daughter have not been sick in the last few days.  He was directly admitted from the oncology clinic this morning.  Review of Systems: Please see HPI for pertinent positives and negatives. A complete 10 system review of systems are otherwise negative.  Past Medical History:  Diagnosis Date   Allergy    Asthma    Past Surgical History:  Procedure Laterality Date   CYST REMOVAL TRUNK     Social History:  reports that he quit smoking about 10 years ago. He has never used smokeless tobacco. He reports current alcohol use of about 5.0 standard drinks of alcohol per week. He reports that he does not use drugs.  No Known Allergies  Family History  Problem Relation Age of Onset   Diabetes Mother    Hypertension Mother    High Cholesterol Mother    Hypertension Father    High Cholesterol Father    Cancer Maternal Grandmother    Heart disease Maternal Grandfather    Diabetes Maternal Grandfather    High Cholesterol Maternal Grandfather    Stroke Maternal Grandfather    Mental illness Maternal Grandfather      Prior to Admission medications   Medication Sig Start Date End Date Taking? Authorizing Provider  albuterol  (VENTOLIN  HFA) 108 (90 Base) MCG/ACT inhaler Inhale 2 puffs into the lungs every 4  (four) hours as needed for wheezing or shortness of breath (cough, shortness of breath or wheezing.). 04/07/22   Shadad, Firas N, MD  azithromycin  (ZITHROMAX  Z-PAK) 250 MG tablet Take 2 tablets (500 mg) on day one, then one tablet (250 mg) for the next four days 05/10/22   Renna Cary, MD  benzonatate  (TESSALON ) 100 MG capsule Take 1 capsule (100 mg total) by mouth every 8 (eight) hours. Patient taking differently: Take 100 mg by mouth 3 (three) times daily as needed for cough. 01/29/18   Wilhelmenia Harada, Amy V, PA-C  dexamethasone  (DECADRON ) 4 MG tablet Take 10 tablets (40 mg total) by mouth every morning with food for 4 days 04/07/23   Dorsey, John T IV, MD  fexofenadine (ALLEGRA) 180 MG tablet Take 180 mg by mouth daily as needed for allergies or rhinitis.    [provider]  fluticasone  (FLONASE ) 50 MCG/ACT nasal spray Place 2 sprays into both nostrils daily. Patient taking differently: Place 2 sprays into both nostrils daily as needed for allergies or rhinitis. 01/29/18   Yu, Amy V, PA-C  HYDROMET 5-1.5 MG/5ML syrup Take 5 mLs by mouth at bedtime as needed for cough.    [provider]  predniSONE  (DELTASONE ) 20 MG tablet Take 2.5 tablets (50 mg total) by mouth daily with breakfast. 07/25/22   Shadad, Firas N, MD  Probiotic Product (PROBIOTIC GUMMIES) 30 MG CHEW Chew 30 g/day by mouth  every morning. Taking two po daily    [provider]    Physical Exam: BP 136/79 (BP Location: Left Arm)   Pulse 87   Temp 97.6 F (36.4 C) (Oral)   Resp 16   SpO2 95%  General:  Alert, oriented, calm, in no acute distress, wife is at the bedside Eyes: EOMI, hemorrhage of conjuctivae especially in the left eye, white sclerea Cardiovascular: RRR, no murmurs or rubs, no peripheral edema  Respiratory: clear to auscultation bilaterally, no wheezes, no crackles  Abdomen: soft, nontender, nondistended, normal bowel tones heard  Skin: dry, petechial rash on the bilateral shins, as well as over the  right bicep Musculoskeletal: no joint effusions, normal range of motion  Psychiatric: appropriate affect, normal speech  Neurologic: extraocular muscles intact, clear speech, moving all extremities with intact sensorium         Labs on Admission:  Basic Metabolic Panel: Recent Labs  Lab 05/09/24 1025  NA 139  K 4.0  CL 107  CO2 27  GLUCOSE 90  BUN 16  CREATININE 1.09  CALCIUM 8.7*   Liver Function Tests: Recent Labs  Lab 05/09/24 1025  AST 22  ALT 33  ALKPHOS 55  BILITOT 1.2  PROT 6.8  ALBUMIN 4.3   No results for input(s): "LIPASE", "AMYLASE" in the last 168 hours. No results for input(s): "AMMONIA" in the last 168 hours. CBC: Recent Labs  Lab 05/09/24 1025  WBC 2.0*  NEUTROABS 0.3*  HGB 14.9  HCT 40.7  MCV 82.9  PLT <5*   Cardiac Enzymes: No results for input(s): "CKTOTAL", "CKMB", "CKMBINDEX", "TROPONINI" in the last 168 hours. BNP (last 3 results) No results for input(s): "BNP" in the last 8760 hours.  ProBNP (last 3 results) No results for input(s): "PROBNP" in the last 8760 hours.  CBG: No results for input(s): "GLUCAP" in the last 168 hours.  Radiological Exams on Admission: No results found.  Assessment/Plan This is a pleasant 43 year old gentleman with a history of ITP being admitted to the hospital with recurrent acute immune thrombocytopenia in the setting of viral GI illness.  Acute ITP-with undetectable platelets, no evidence of active bleeding. -Inpatient admission -Start p.o. Decadron  40 mg p.o. daily x 4 days -Discussed with Dr. Rosaline Coma, no platelet transfusion unless evidence of bleeding  Gastroenteritis-without focal abdominal tenderness, fever or other concerning/focalizing concerns. -Supportive care with IV fluids, nausea medication if needed -Check stool PCR, and stool culture  Leukopenia-may be a response to acute illness, will monitor with  DVT prophylaxis: SCDs only    Code Status: Full Code  Consults called:  None  Admission status: The appropriate patient status for this patient is INPATIENT. Inpatient status is judged to be reasonable and necessary in order to provide the required intensity of service to ensure the patient's safety. The patient's presenting symptoms, physical exam findings, and initial radiographic and laboratory data in the context of their chronic comorbidities is felt to place them at high risk for further clinical deterioration. Furthermore, it is not anticipated that the patient will be medically stable for discharge from the hospital within 2 midnights of admission.    I certify that at the point of admission it is my clinical judgment that the patient will require inpatient hospital care spanning beyond 2 midnights from the point of admission due to high intensity of service, high risk for further deterioration and high frequency of surveillance required  Time spent: 59 minutes  Floriene Jeschke Rickey Charm MD [facility] Pager [phone]  If 7PM-7AM, please contact night-coverage [email or web address] Password [email or web address]  05/09/2024, 1:09 PM

## 2024-05-10 ENCOUNTER — Other Ambulatory Visit: Payer: Self-pay

## 2024-05-10 ENCOUNTER — Encounter (HOSPITAL_COMMUNITY): Payer: Self-pay | Admitting: Internal Medicine

## 2024-05-10 DIAGNOSIS — D693 Immune thrombocytopenic purpura: Secondary | ICD-10-CM | POA: Diagnosis not present

## 2024-05-10 LAB — CBC
HCT: 40.8 % (ref 39.0–52.0)
Hemoglobin: 14.4 g/dL (ref 13.0–17.0)
MCH: 30.4 pg (ref 26.0–34.0)
MCHC: 35.3 g/dL (ref 30.0–36.0)
MCV: 86.3 fL (ref 80.0–100.0)
Platelets: 14 10*3/uL — CL (ref 150–400)
RBC: 4.73 MIL/uL (ref 4.22–5.81)
RDW: 12.4 % (ref 11.5–15.5)
WBC: 1.7 10*3/uL — ABNORMAL LOW (ref 4.0–10.5)
nRBC: 0 % (ref 0.0–0.2)

## 2024-05-10 LAB — HIV ANTIBODY (ROUTINE TESTING W REFLEX): HIV Screen 4th Generation wRfx: NONREACTIVE

## 2024-05-10 LAB — BASIC METABOLIC PANEL WITH GFR
Anion gap: 6 (ref 5–15)
BUN: 16 mg/dL (ref 6–20)
CO2: 23 mmol/L (ref 22–32)
Calcium: 8.7 mg/dL — ABNORMAL LOW (ref 8.9–10.3)
Chloride: 109 mmol/L (ref 98–111)
Creatinine, Ser: 0.86 mg/dL (ref 0.61–1.24)
GFR, Estimated: >60 mL/min (ref >60)
Glucose, Bld: 168 mg/dL — ABNORMAL HIGH (ref 70–99)
Potassium: 3.9 mmol/L (ref 3.5–5.1)
Sodium: 138 mmol/L (ref 135–145)

## 2024-05-10 MED ORDER — SODIUM CHLORIDE 0.9 % IV SOLN: INTRAVENOUS | Status: AC

## 2024-05-10 NOTE — Progress Notes (Signed)
 PROGRESS NOTE    Patient: Mark Duarte                            PCP: Alejandro Hurt, FNP                    DOB: 1981-11-08            DOA: 05/09/2024 MWU:[id number]             DOS: 05/10/2024, 12:18 PM   LOS: 1 day   Date of Service: The patient was seen and examined on 05/10/2024  Subjective:   The patient was seen and examined this morning. Hemodynamically stable. No issues overnight .  Brief Narrative:   Mark Duarte is a 43 y.o. male with medical history significant for ITP being admitted to the hospital with acute ITP.  He had a scheduled 27-month follow-up with his hematologist today, lab work reveals undetectable platelet count.  For the past approximately 4 days, he has had some nausea with vomiting, and diarrhea.  Denies any hematemesis or blood in his stool.  Has not vomited actually in 3 days, but still has ongoing watery diarrhea, has already gone 5 times this morning.  Denies any fevers or chills, no sick contacts, his wife and daughter have not been sick in the last few days.  He was directly admitted from the oncology clinic this morning.      Assessment & Plan:   Principal Problem:   Acute ITP (HCC)   Assessment/Plan:  Acute ITP -with undetectable platelets Plt <5 ---- 14 - No evidence of active bleeding -Start p.o. Decadron  40 mg p.o. daily x 4 days -Discussed with Dr. Rosaline Coma, no platelet transfusion unless evidence of bleeding   Gastroenteritis - without focal abdominal tenderness, fever or other concerning/focalizing concerns. -Supportive care with IV fluids, nausea medication if needed -Check stool PCR, and stool culture   Leukopenia -may be a response to acute illness, will monitor    ----------------------------------------------------------------------------------------------------------------------- Nutritional status:  The patient's BMI is: There is no height or weight on file to calculate BMI. I agree with the assessment and plan  as outlined --------------------------------------------------------------------------------------------------------------------  DVT prophylaxis:  SCDs Start: 05/09/24 1308   Code Status:   Code Status: Full Code  Family Communication: Wife present at bedside-updated -Advance care planning has been discussed.   Admission status:   Status is: Inpatient Remains inpatient appropriate because: Needing IV steroids, close monitoring for severe thrombocytopenia   Disposition: From  - home             Planning for discharge in 1-2 days: to   Procedures:   No admission procedures for hospital encounter.   Antimicrobials:  Anti-infectives (From admission, onward)    None        Medication:   dexamethasone   40 mg Oral Daily    acetaminophen  **OR** acetaminophen , albuterol , ondansetron **OR** ondansetron (ZOFRAN) IV, traZODone   Objective:   Vitals:   05/09/24 1243 05/09/24 1700  BP: 136/79 118/67  Pulse: 87 86  Resp: 16 16  Temp: 97.6 F (36.4 C) 97.7 F (36.5 C)  TempSrc: Oral Oral  SpO2: 95% 96%    Intake/Output Summary (Last 24 hours) at 05/10/2024 1218 Last data filed at 05/10/2024 0900 Gross per 24 hour  Intake 720 ml  Output --  Net 720 ml   There were no vitals filed for this visit.   Physical examination:  Constitution:  Alert, cooperative, no distress,  Appears calm and comfortable  Psychiatric:   Normal and stable mood and affect, cognition intact,   HEENT:        Normocephalic, PERRL, otherwise with in Normal limits  Chest:         Chest symmetric Cardio vascular:  S1/S2, RRR, No murmure, No Rubs or Gallops  pulmonary: Clear to auscultation bilaterally, respirations unlabored, negative wheezes / crackles Abdomen: Soft, non-tender, non-distended, bowel sounds,no masses, no organomegaly Muscular skeletal: Limited exam - in bed, able to move all 4 extremities,   Neuro: CNII-XII intact. , normal motor and sensation, reflexes intact  Extremities:  No pitting edema lower extremities, +2 pulses  Skin: Dry, warm to touch, negative for any Rashes, No open wounds Wounds: per nursing documentation -----------------------------------------------------------------------------------------------------------------------    LABs:     Latest Ref Rng & Units 05/10/2024    5:56 AM 05/09/2024   10:25 AM 11/09/2023    9:12 AM  CBC  WBC 4.0 - 10.5 K/uL 1.7  2.0  3.8   Hemoglobin 13.0 - 17.0 g/dL 41.3  24.4  01.0   Hematocrit 39.0 - 52.0 % 40.8  40.7  41.5   Platelets 150 - 400 K/uL 14  <5  118       Latest Ref Rng & Units 05/10/2024    5:56 AM 05/09/2024   10:25 AM 11/09/2023    9:12 AM  CMP  Glucose 70 - 99 mg/dL 272  90  536   BUN 6 - 20 mg/dL 16  16  18    Creatinine 0.61 - 1.24 mg/dL 6.44  0.34  7.42   Sodium 135 - 145 mmol/L 138  139  138   Potassium 3.5 - 5.1 mmol/L 3.9  4.0  4.0   Chloride 98 - 111 mmol/L 109  107  105   CO2 22 - 32 mmol/L 23  27  28    Calcium 8.9 - 10.3 mg/dL 8.7  8.7  9.2   Total Protein 6.5 - 8.1 g/dL  6.8  6.7   Total Bilirubin 0.0 - 1.2 mg/dL  1.2  0.7   Alkaline Phos 38 - 126 U/L  55  64   AST 15 - 41 U/L  22  17   ALT 0 - 44 U/L  33  27        Micro Results No results found for this or any previous visit (from the past 240 hours).  Radiology Reports No results found.  SIGNED: Bobbetta Burnet, MD, FHM. FAAFP. Arlin Benes - [facility] Time spent - 35 min.  In seeing, evaluating and examining the patient. Reviewing medical records, labs, drawn plan of care. [facility],  Pager (please use [email or web address] to page/ text) Please use Epic Secure Chat for non-urgent communication (7AM-7PM)  If 7PM-7AM, please contact night-coverage [email or web address], 05/10/2024, 12:18 PM

## 2024-05-10 NOTE — Progress Notes (Signed)
 Hematology/Oncology Progress Note  Clinical Summary: Mark Duarte 43 y.o. male with medical history significant for autoimmune thrombocytopenia/neutropenia who is currently admitted with an ITP flare likely provoked by diarrheal illness.   Interval History: --diarrhea slowing down. 1 episode today at 5:00 am. Non-bloody -- no overt signs of bleeding. Conjunctival hemorrhage improving --Plt increase to 14 -- today is Day 2 of Dex 40 mg PO. Continue x 4 days.  --denies f/c/s or N/V.  -- in good spirits and feeling better with IV hydration and steroids  O:  Vitals:   05/09/24 1700 05/10/24 1336  BP: 118/67 (!) 116/56  Pulse: 86 (!) 101  Resp: 16 16  Temp: 97.7 F (36.5 C) 97.6 F (36.4 C)  SpO2: 96% 93%      Latest Ref Rng & Units 05/10/2024    5:56 AM 05/09/2024   10:25 AM 11/09/2023    9:12 AM  CMP  Glucose 70 - 99 mg/dL 811  90  914   BUN 6 - 20 mg/dL 16  16  18    Creatinine 0.61 - 1.24 mg/dL 7.82  9.56  2.13   Sodium 135 - 145 mmol/L 138  139  138   Potassium 3.5 - 5.1 mmol/L 3.9  4.0  4.0   Chloride 98 - 111 mmol/L 109  107  105   CO2 22 - 32 mmol/L 23  27  28    Calcium 8.9 - 10.3 mg/dL 8.7  8.7  9.2   Total Protein 6.5 - 8.1 g/dL  6.8  6.7   Total Bilirubin 0.0 - 1.2 mg/dL  1.2  0.7   Alkaline Phos 38 - 126 U/L  55  64   AST 15 - 41 U/L  22  17   ALT 0 - 44 U/L  33  27       Latest Ref Rng & Units 05/10/2024    5:56 AM 05/09/2024   10:25 AM 11/09/2023    9:12 AM  CBC  WBC 4.0 - 10.5 K/uL 1.7  2.0  3.8   Hemoglobin 13.0 - 17.0 g/dL 08.6  57.8  46.9   Hematocrit 39.0 - 52.0 % 40.8  40.7  41.5   Platelets 150 - 400 K/uL 14  <5  118       GENERAL: well appearing middle aged Caucasian male in NAD  SKIN: skin color, texture, turgor are normal, no rashes or significant lesions EYES: conjunctiva are pink and non-injected, sclera clear LUNGS: clear to auscultation and percussion with normal breathing effort HEART: regular rate & rhythm and no murmurs and no  lower extremity edema Musculoskeletal: no cyanosis of digits and no clubbing  PSYCH: alert & oriented x 3, fluent speech NEURO: no focal motor/sensory deficits  Assessment/Plan:  # Severe Thrombocytopenia # Hx of Autoimmune Thrombocytopenia/Neutropenia --GI illness appears to have triggered an ITP flare, Plt <5 --continue Dexamethasone  40 mg PO daily x 4 days with daily labs in house. If not successful in raising his Plt will add IVIG --today is Day 2 of dexamethasone  40 mg PO  --do not transfuse platelets unless patient has overt bleeding -- no headache/vision changes. No evidence of GI bleeding. Patient has petechiae and subconjunctival hemorrhage (now improving) --recommend admission for diarrhea workup and monitoring of Plt count.  --labs today show WBC 1.7, Hgb 14.4, MCV 86.3, Plt 14  -- Hematology will follow in house. Recommend considering D/c if Plt are >100 and rising.    # Diarrheal Illness (improving) -- patient has  watery diarrhea x 4 days prior to admission, he suspects it is 2/2 to undercooked [place] burger --f/u GI pathology panel. Agree with continued IVF  -- improved today -- continue to monitor      Rogerio Clay, MD Department of Hematology/Oncology [facility] Phone: [phone] Pager: [phone] Email: [email or web address]

## 2024-05-10 NOTE — Hospital Course (Addendum)
 Mark Duarte is a 43 y.o. male with medical history significant for ITP being admitted to the hospital with acute ITP.  He had a scheduled 26-month follow-up with his hematologist today, lab work reveals undetectable platelet count.  For the past approximately 4 days, he has had some nausea with vomiting, and diarrhea.  Denies any hematemesis or blood in his stool.  Has not vomited actually in 3 days, but still has ongoing watery diarrhea, has already gone 5 times this morning.  Denies any fevers or chills, no sick contacts, his wife and daughter have not been sick in the last few days.  He was directly admitted from the oncology clinic this morning.      Assessment & Plan:   Principal Problem:   Acute ITP (HCC)   Assessment/Plan:  Acute ITP -with undetectable platelets Plt <5 ---- 14 - No evidence of active bleeding -Start p.o. Decadron  40 mg p.o. daily x 4 days -Discussed with Dr. Rosaline Coma, no platelet transfusion unless evidence of bleeding   Gastroenteritis - without focal abdominal tenderness, fever or other concerning/focalizing concerns. -Supportive care with IV fluids, nausea medication if needed -Check stool PCR, and stool culture   Leukopenia -may be a response to acute illness, will monitor

## 2024-05-11 DIAGNOSIS — D693 Immune thrombocytopenic purpura: Secondary | ICD-10-CM | POA: Diagnosis not present

## 2024-05-11 LAB — CBC
HCT: 38 % — ABNORMAL LOW (ref 39.0–52.0)
Hemoglobin: 13.1 g/dL (ref 13.0–17.0)
MCH: 30.5 pg (ref 26.0–34.0)
MCHC: 34.5 g/dL (ref 30.0–36.0)
MCV: 88.4 fL (ref 80.0–100.0)
Platelets: 71 10*3/uL — ABNORMAL LOW (ref 150–400)
RBC: 4.3 MIL/uL (ref 4.22–5.81)
RDW: 12.9 % (ref 11.5–15.5)
WBC: 5.1 10*3/uL (ref 4.0–10.5)
nRBC: 0 % (ref 0.0–0.2)

## 2024-05-11 LAB — GASTROINTESTINAL PANEL BY PCR, STOOL (REPLACES STOOL CULTURE)

## 2024-05-11 MED ORDER — ACETAMINOPHEN 325 MG PO TABS: 650.0000 mg | ORAL_TABLET | Freq: Four times a day (QID) | ORAL | 0 refills | Status: AC | PRN

## 2024-05-11 MED ORDER — DEXAMETHASONE 20 MG PO TABS: 40.0000 mg | ORAL_TABLET | Freq: Every day | ORAL | 0 refills | Status: AC

## 2024-05-11 NOTE — Progress Notes (Signed)
 Hematology/Oncology Progress Note  Date of service 05/11/2024   Interval History: Patient was seen with his wife at bedside for hematology follow-up.  He notes that his diarrhea has resolved and he was able to tolerate solid food last evening and this morning and has no nausea vomiting or diarrhea at this time.  Maintaining good p.o. hydration and feels much better.  No new bleeding issues.  Petechiae and subconjunctival hemorrhages improving. Tolerating his steroids without any acute issues. GI panel showed rotavirus and we discussed precautions. His platelets have improved to 71k today. He and his wife are keen for him to go home.  O:  Vitals:   05/10/24 1336 05/11/24 0500  BP: (!) 116/56 120/61  Pulse: (!) 101 87  Resp: 16 18  Temp: 97.6 F (36.4 C) 97.6 F (36.4 C)  SpO2: 93% 95%      Latest Ref Rng & Units 05/10/2024    5:56 AM 05/09/2024   10:25 AM 11/09/2023    9:12 AM  CMP  Glucose 70 - 99 mg/dL 409  90  811   BUN 6 - 20 mg/dL 16  16  18    Creatinine 0.61 - 1.24 mg/dL 9.14  7.82  9.56   Sodium 135 - 145 mmol/L 138  139  138   Potassium 3.5 - 5.1 mmol/L 3.9  4.0  4.0   Chloride 98 - 111 mmol/L 109  107  105   CO2 22 - 32 mmol/L 23  27  28    Calcium 8.9 - 10.3 mg/dL 8.7  8.7  9.2   Total Protein 6.5 - 8.1 g/dL  6.8  6.7   Total Bilirubin 0.0 - 1.2 mg/dL  1.2  0.7   Alkaline Phos 38 - 126 U/L  55  64   AST 15 - 41 U/L  22  17   ALT 0 - 44 U/L  33  27       Latest Ref Rng & Units 05/11/2024    7:40 AM 05/10/2024    5:56 AM 05/09/2024   10:25 AM  CBC  WBC 4.0 - 10.5 K/uL 5.1  1.7  2.0   Hemoglobin 13.0 - 17.0 g/dL 21.3  08.6  57.8   Hematocrit 39.0 - 52.0 % 38.0  40.8  40.7   Platelets 150 - 400 K/uL 71  14  <5     NAD GENERAL:alert, in no acute distress and comfortable SKIN: There is a linear petechiae in lower extremities, resolving ecchymosis related to IV line on right forearm LUNGS: clear to auscultation b/l with normal respiratory effort HEART: regular  rate & rhythm ABDOMEN:  normoactive bowel sounds , non tender, not distended. Extremity: no pedal edema PSYCH: alert & oriented x 3 with fluent speech NEURO: no focal motor/sensory deficits  Assessment/Plan:  # Severe Thrombocytopenia # Hx of Autoimmune Thrombocytopenia/Neutropenia # Acute diarrheal illness related to rotavirus infection Patient previously had platelets of 84k in March 2025. Thought to be having acute on chronic immune thrombocytopenia in the context of acute diarrheal illness with rotavirus. Plan - Patient is feeling much better and his diarrheal illness has resolved.  This was noted to be related to rotavirus. - He is tolerating solid food and adequate p.o. fluid intake with complete resolution of diarrhea and no abdominal symptoms. - No new bleeding issues.  No gum bleeds.  No new petechiae.  No headaches no shortness of breath or chest pain no abdominal pain. - CBC today shows rapidly improving platelet count of  71k.  His leukopenia has resolved as well.  Hemoglobin is relatively stable at 13.1 slightly low due to hemodilution after IV fluids and since he was hemoconcentrated on admission due to diarrhea. -Patient was given the option of monitoring platelet trends for 1 more day but he prefers to go home today. -Would complete the 4 days of high-dose dexamethasone  at 40 mg daily to complete a total of 4 days. - Maintain good p.o. fluids and electrolyte intake. - He was recommended to follow-up with Dr. Rosaline Coma as outpatient on Tuesday or Wednesday of next week for repeat labs to continue monitoring improvement in his platelet counts. - He knows to seek immediate attention if there is any new concerns for abnormal bleeding or bruising. - With rapid improvement in his platelet counts and a steroid responsive fashion we will hold off on IVIG or other treatments at this time considering the underlying trigger which was his diarrheal illness is much improved and resolved.  The  total time spent in the appointment was 35 minutes*.  All of the patient's questions were answered with apparent satisfaction. The patient knows to call the clinic with any problems, questions or concerns.   Jacquelyn Matt MD MS AAHIVMS [facility] Hematology/Oncology Physician [facility]  .*Total Encounter Time as defined by the Centers for Medicare and Medicaid Services includes, in addition to the face-to-face time of a patient visit (documented in the note above) non-face-to-face time: obtaining and reviewing outside history, ordering and reviewing medications, tests or procedures, care coordination (communications with other health care professionals or caregivers) and documentation in the medical record.

## 2024-05-11 NOTE — Plan of Care (Addendum)
 Patient discharged, all medications, follow up apts handed to patient and explained. PIV removed, no further needs atm.  Problem: Education: Goal: Knowledge of General Education information will improve Description: Including pain rating scale, medication(s)/side effects and non-pharmacologic comfort measures Outcome: Adequate for Discharge   Problem: Health Behavior/Discharge Planning: Goal: Ability to manage health-related needs will improve Outcome: Adequate for Discharge   Problem: Clinical Measurements: Goal: Ability to maintain clinical measurements within normal limits will improve Outcome: Adequate for Discharge Goal: Will remain free from infection Outcome: Adequate for Discharge Goal: Diagnostic test results will improve Outcome: Adequate for Discharge Goal: Respiratory complications will improve Outcome: Adequate for Discharge Goal: Cardiovascular complication will be avoided Outcome: Adequate for Discharge   Problem: Activity: Goal: Risk for activity intolerance will decrease Outcome: Adequate for Discharge   Problem: Nutrition: Goal: Adequate nutrition will be maintained Outcome: Adequate for Discharge   Problem: Coping: Goal: Level of anxiety will decrease Outcome: Adequate for Discharge   Problem: Elimination: Goal: Will not experience complications related to bowel motility Outcome: Adequate for Discharge Goal: Will not experience complications related to urinary retention Outcome: Adequate for Discharge   Problem: Pain Managment: Goal: General experience of comfort will improve and/or be controlled Outcome: Adequate for Discharge   Problem: Safety: Goal: Ability to remain free from injury will improve Outcome: Adequate for Discharge   Problem: Skin Integrity: Goal: Risk for impaired skin integrity will decrease Outcome: Adequate for Discharge   Problem: Education: Goal: Knowledge of General Education information will improve Description:  Including pain rating scale, medication(s)/side effects and non-pharmacologic comfort measures Outcome: Adequate for Discharge   Problem: Health Behavior/Discharge Planning: Goal: Ability to manage health-related needs will improve Outcome: Adequate for Discharge   Problem: Clinical Measurements: Goal: Ability to maintain clinical measurements within normal limits will improve Outcome: Adequate for Discharge Goal: Will remain free from infection Outcome: Adequate for Discharge Goal: Diagnostic test results will improve Outcome: Adequate for Discharge Goal: Respiratory complications will improve Outcome: Adequate for Discharge Goal: Cardiovascular complication will be avoided Outcome: Adequate for Discharge   Problem: Activity: Goal: Risk for activity intolerance will decrease Outcome: Adequate for Discharge   Problem: Nutrition: Goal: Adequate nutrition will be maintained Outcome: Adequate for Discharge   Problem: Coping: Goal: Level of anxiety will decrease Outcome: Adequate for Discharge   Problem: Elimination: Goal: Will not experience complications related to bowel motility Outcome: Adequate for Discharge Goal: Will not experience complications related to urinary retention Outcome: Adequate for Discharge   Problem: Pain Managment: Goal: General experience of comfort will improve and/or be controlled Outcome: Adequate for Discharge   Problem: Safety: Goal: Ability to remain free from injury will improve Outcome: Adequate for Discharge   Problem: Skin Integrity: Goal: Risk for impaired skin integrity will decrease Outcome: Adequate for Discharge

## 2024-05-11 NOTE — Discharge Summary (Signed)
 Physician Discharge Summary   Patient: Mark Duarte MRN: [id number] DOB: Mar 27, 1981  Admit date:     05/09/2024  Discharge date: 05/11/24  Discharge Physician: Bobbetta Burnet   PCP: Alejandro Hurt, FNP   Recommendations at discharge:   Follow-up with your hematologist within 1 week CBC in 1 week Follow CBC in 2-4 weeks  Discharge Diagnoses: Principal Problem:   Acute ITP (HCC)  Resolved Problems:   * No resolved hospital problems. *  Hospital Course: Mark Duarte is a 43 y.o. male with medical history significant for ITP being admitted to the hospital with acute ITP.  He had a scheduled 66-month follow-up with his hematologist today, lab work reveals undetectable platelet count.  For the past approximately 4 days, he has had some nausea with vomiting, and diarrhea.  Denies any hematemesis or blood in his stool.  Has not vomited actually in 3 days, but still has ongoing watery diarrhea, has already gone 5 times this morning.  Denies any fevers or chills, no sick contacts, his wife and daughter have not been sick in the last few days.  He was directly admitted from the oncology clinic this morning.   Acute ITP -with undetectable platelets Plt <5 ---- 14 >>>>> 71 - No evidence of active bleeding -Start p.o. Decadron  40 mg p.o. daily x 4 days -Discussed with Dr. Rosaline Coma, no platelet transfusion unless evidence of bleeding   Gastroenteritis -due to Rotavirus A -Resolved, tolerating p.o., improved diarrhea - without focal abdominal tenderness, fever or other concerning/focalizing concerns. -Supportive care with IV fluids,  - Improved nausea vomiting, tolerating p.o. - PCR stool studies positive for rotavirus A   Leukopenia -may be a response to acute illness, will monitor    Consultants: Hematologist  Disposition: Home Diet recommendation:  Discharge Diet Orders (From admission, onward)     Start     Ordered   05/11/24 0000  Diet - low sodium heart healthy         05/11/24 1119           Regular diet DISCHARGE MEDICATION: Allergies as of 05/11/2024   No Known Allergies      Medication List     STOP taking these medications    ibuprofen  800 MG tablet Commonly known as: ADVIL        TAKE these medications    acetaminophen  325 MG tablet Commonly known as: TYLENOL  Take 2 tablets (650 mg total) by mouth every 6 (six) hours as needed for mild pain (pain score 1-3) (or Fever >/= 101).   albuterol  108 (90 Base) MCG/ACT inhaler Commonly known as: VENTOLIN  HFA Inhale 2 puffs into the lungs every 4 (four) hours as needed for wheezing or shortness of breath (cough, shortness of breath or wheezing.).   cyclobenzaprine 10 MG tablet Commonly known as: FLEXERIL Take 10 mg by mouth 3 (three) times daily as needed for muscle spasms.   dexAMETHasone  20 MG Tabs Take 40 mg by mouth daily for 2 days. Start taking on: May 12, 2024   MULTIVITAL PO Take 1 tablet by mouth daily.        Discharge Exam: There were no vitals filed for this visit.      General:  AAO x 3,  cooperative, no distress;   HEENT:  Normocephalic, PERRL, otherwise with in Normal limits   Neuro:  CNII-XII intact. , normal motor and sensation, reflexes intact   Lungs:   Clear to auscultation BL, Respirations unlabored,  No wheezes /  crackles  Cardio:    S1/S2, RRR, No murmure, No Rubs or Gallops   Abdomen:  Soft, non-tender, bowel sounds active all four quadrants, no guarding or peritoneal signs.  Muscular  skeletal:  Limited exam -global generalized weaknesses - in bed, able to move all 4 extremities,   2+ pulses,  symmetric, No pitting edema  Skin:  Dry, warm to touch, negative for any Rashes,  Wounds: Please see nursing documentation       Condition at discharge: good  The results of significant diagnostics from this hospitalization (including imaging, microbiology, ancillary and laboratory) are listed below for reference.   Imaging Studies: DG Ankle  Complete Right Result Date: 05/04/2024 CLINICAL DATA:  Sprain. Tripped over a stool at work 04/19/2024. Anterolateral pain. EXAM: RIGHT ANKLE - COMPLETE 3+ VIEW COMPARISON:  Right ankle radiographs 01/12/2019 FINDINGS: Ankle mortise is symmetric and intact. Mild lateral malleolar soft tissue swelling, overall decreased from remote prior radiograph. There is a 6 mm chronic ossicle distal to the fibula and lateral to the lateral talar process, unchanged. The ankle mortise is symmetric and intact. Minimal chronic enthesopathic change at the Achilles insertion on the calcaneus. No acute fracture or dislocation. IMPRESSION: Mild lateral malleolar soft tissue swelling, overall decreased from remote prior radiograph. No acute fracture. Electronically Signed   By: Bertina Broccoli M.D.   On: 05/04/2024 16:21    Microbiology: Results for orders placed or performed during the hospital encounter of 05/09/24  Gastrointestinal Panel by PCR , Stool     Status: Abnormal   Collection Time: 05/10/24  6:00 AM   Specimen: Stool  Result Value Ref Range Status   Campylobacter species NOT DETECTED NOT DETECTED Final   Plesimonas shigelloides NOT DETECTED NOT DETECTED Final   Salmonella species NOT DETECTED NOT DETECTED Final   Yersinia enterocolitica NOT DETECTED NOT DETECTED Final   Vibrio species NOT DETECTED NOT DETECTED Final   Vibrio cholerae NOT DETECTED NOT DETECTED Final   Enteroaggregative E coli (EAEC) NOT DETECTED NOT DETECTED Final   Enteropathogenic E coli (EPEC) NOT DETECTED NOT DETECTED Final   Enterotoxigenic E coli (ETEC) NOT DETECTED NOT DETECTED Final   Shiga like toxin producing E coli (STEC) NOT DETECTED NOT DETECTED Final   Shigella/Enteroinvasive E coli (EIEC) NOT DETECTED NOT DETECTED Final   Cryptosporidium NOT DETECTED NOT DETECTED Final   Cyclospora cayetanensis NOT DETECTED NOT DETECTED Final   Entamoeba histolytica NOT DETECTED NOT DETECTED Final   Giardia lamblia NOT DETECTED NOT  DETECTED Final   Adenovirus F40/41 NOT DETECTED NOT DETECTED Final   Astrovirus NOT DETECTED NOT DETECTED Final   Norovirus GI/GII NOT DETECTED NOT DETECTED Final   Rotavirus A DETECTED (A) NOT DETECTED Final   Sapovirus (I, II, IV, and V) NOT DETECTED NOT DETECTED Final    Comment: Performed at [facility], [address] [place], [place] [address]    Labs: CBC: Recent Labs  Lab 05/09/24 1025 05/10/24 0556 05/11/24 0740  WBC 2.0* 1.7* 5.1  NEUTROABS 0.3*  --   --   HGB 14.9 14.4 13.1  HCT 40.7 40.8 38.0*  MCV 82.9 86.3 88.4  PLT <5* 14* 71*   Basic Metabolic Panel: Recent Labs  Lab 05/09/24 1025 05/10/24 0556  NA 139 138  K 4.0 3.9  CL 107 109  CO2 27 23  GLUCOSE 90 168*  BUN 16 16  CREATININE 1.09 0.86  CALCIUM 8.7* 8.7*   Liver Function Tests: Recent Labs  Lab 05/09/24 1025  AST 22  ALT 33  ALKPHOS 55  BILITOT 1.2  PROT 6.8  ALBUMIN 4.3   CBG: No results for input(s): "GLUCAP" in the last 168 hours.  Discharge time spent: greater than 30 minutes.  Signed: Bobbetta Burnet, MD [facility] 05/11/2024

## 2024-05-11 NOTE — Plan of Care (Signed)
  Problem: Clinical Measurements: Goal: Will remain free from infection Outcome: Progressing   Problem: Activity: Goal: Risk for activity intolerance will decrease Outcome: Progressing   Problem: Nutrition: Goal: Adequate nutrition will be maintained Outcome: Progressing   Problem: Elimination: Goal: Will not experience complications related to bowel motility Outcome: Progressing Goal: Will not experience complications related to urinary retention Outcome: Progressing   Problem: Pain Managment: Goal: General experience of comfort will improve and/or be controlled Outcome: Progressing   Problem: Safety: Goal: Ability to remain free from injury will improve Outcome: Progressing

## 2024-05-14 ENCOUNTER — Telehealth: Payer: Self-pay | Admitting: Hematology and Oncology

## 2024-05-14 ENCOUNTER — Ambulatory Visit: Payer: Self-pay

## 2024-05-14 ENCOUNTER — Inpatient Hospital Stay

## 2024-05-14 ENCOUNTER — Other Ambulatory Visit: Payer: Self-pay | Admitting: Hematology and Oncology

## 2024-05-14 DIAGNOSIS — R197 Diarrhea, unspecified: Secondary | ICD-10-CM | POA: Diagnosis not present

## 2024-05-14 DIAGNOSIS — D696 Thrombocytopenia, unspecified: Secondary | ICD-10-CM

## 2024-05-14 DIAGNOSIS — Z87891 Personal history of nicotine dependence: Secondary | ICD-10-CM | POA: Diagnosis not present

## 2024-05-14 DIAGNOSIS — D693 Immune thrombocytopenic purpura: Secondary | ICD-10-CM | POA: Diagnosis not present

## 2024-05-14 DIAGNOSIS — R112 Nausea with vomiting, unspecified: Secondary | ICD-10-CM | POA: Diagnosis not present

## 2024-05-14 LAB — CBC WITH DIFFERENTIAL (CANCER CENTER ONLY)
Abs Immature Granulocytes: 0.76 10*3/uL — ABNORMAL HIGH (ref 0.00–0.07)
Basophils Absolute: 0 10*3/uL (ref 0.0–0.1)
Basophils Relative: 0 %
Eosinophils Absolute: 0 10*3/uL (ref 0.0–0.5)
Eosinophils Relative: 0 %
HCT: 41.8 % (ref 39.0–52.0)
Hemoglobin: 15.2 g/dL (ref 13.0–17.0)
Immature Granulocytes: 9 %
Lymphocytes Relative: 48 %
Lymphs Abs: 4 10*3/uL (ref 0.7–4.0)
MCH: 30.5 pg (ref 26.0–34.0)
MCHC: 36.4 g/dL — ABNORMAL HIGH (ref 30.0–36.0)
MCV: 83.9 fL (ref 80.0–100.0)
Monocytes Absolute: 0.9 10*3/uL (ref 0.1–1.0)
Monocytes Relative: 10 %
Neutro Abs: 2.8 10*3/uL (ref 1.7–7.7)
Neutrophils Relative %: 33 %
Platelet Count: 178 10*3/uL (ref 150–400)
RBC: 4.98 MIL/uL (ref 4.22–5.81)
RDW: 12.8 % (ref 11.5–15.5)
Smear Review: NORMAL
WBC Count: 8.6 10*3/uL (ref 4.0–10.5)
nRBC: 0.2 % (ref 0.0–0.2)

## 2024-05-14 LAB — STOOL CULTURE: E coli, Shiga toxin Assay: NEGATIVE

## 2024-05-14 LAB — STOOL CULTURE REFLEX - CMPCXR

## 2024-05-14 LAB — STOOL CULTURE REFLEX - RSASHR

## 2024-05-14 NOTE — Telephone Encounter (Signed)
-----   Message from Rogerio Clay IV sent at 05/14/2024  1:11 PM EDT ----- Please let Mr. Hinojos know his Plt have improved to 178. We will check again on Friday as well. ----- Message ----- From: Interface, Lab In [place] Sent: 05/14/2024  12:10 PM EDT To: Ander Bame, MD

## 2024-05-14 NOTE — Telephone Encounter (Signed)
 Pt advised of increase of platelets to 178 and voiced understanding.  Appt confirmed for Friday to repeat labs

## 2024-05-17 ENCOUNTER — Inpatient Hospital Stay

## 2024-05-17 ENCOUNTER — Other Ambulatory Visit: Payer: Self-pay | Admitting: Hematology and Oncology

## 2024-05-17 DIAGNOSIS — R112 Nausea with vomiting, unspecified: Secondary | ICD-10-CM | POA: Diagnosis not present

## 2024-05-17 DIAGNOSIS — Z87891 Personal history of nicotine dependence: Secondary | ICD-10-CM | POA: Diagnosis not present

## 2024-05-17 DIAGNOSIS — D696 Thrombocytopenia, unspecified: Secondary | ICD-10-CM

## 2024-05-17 DIAGNOSIS — R197 Diarrhea, unspecified: Secondary | ICD-10-CM | POA: Diagnosis not present

## 2024-05-17 DIAGNOSIS — D693 Immune thrombocytopenic purpura: Secondary | ICD-10-CM | POA: Diagnosis not present

## 2024-05-17 LAB — CBC WITH DIFFERENTIAL (CANCER CENTER ONLY)
Abs Immature Granulocytes: 0.12 10*3/uL — ABNORMAL HIGH (ref 0.00–0.07)
Basophils Absolute: 0 10*3/uL (ref 0.0–0.1)
Basophils Relative: 0 %
Eosinophils Absolute: 0 10*3/uL (ref 0.0–0.5)
Eosinophils Relative: 0 %
HCT: 43.2 % (ref 39.0–52.0)
Hemoglobin: 15.2 g/dL (ref 13.0–17.0)
Immature Granulocytes: 2 %
Lymphocytes Relative: 36 %
Lymphs Abs: 2.5 10*3/uL (ref 0.7–4.0)
MCH: 29.8 pg (ref 26.0–34.0)
MCHC: 35.2 g/dL (ref 30.0–36.0)
MCV: 84.7 fL (ref 80.0–100.0)
Monocytes Absolute: 0.7 10*3/uL (ref 0.1–1.0)
Monocytes Relative: 10 %
Neutro Abs: 3.6 10*3/uL (ref 1.7–7.7)
Neutrophils Relative %: 52 %
Platelet Count: 71 10*3/uL — ABNORMAL LOW (ref 150–400)
RBC: 5.1 MIL/uL (ref 4.22–5.81)
RDW: 12.8 % (ref 11.5–15.5)
WBC Count: 7 10*3/uL (ref 4.0–10.5)
nRBC: 0 % (ref 0.0–0.2)

## 2024-05-20 ENCOUNTER — Telehealth: Payer: Self-pay | Admitting: *Deleted

## 2024-05-20 ENCOUNTER — Other Ambulatory Visit: Payer: Self-pay | Admitting: *Deleted

## 2024-05-20 DIAGNOSIS — D696 Thrombocytopenia, unspecified: Secondary | ICD-10-CM

## 2024-05-20 NOTE — Telephone Encounter (Signed)
 TCT patient regarding his recent lab results. Notably the platelet count of 71k. Per Dr Kyung Pi wants to do serial labs ato follow platelet count on 6/3, 6/6, 6/10, and 05/31/2024.   No answer to call. LVM for pt to call back regarding lab appts.  Scheduler aware of need for lab appts.

## 2024-05-20 NOTE — Telephone Encounter (Signed)
 Received call back from pt. Advised about the 2 x a week labs for the next 2 weeks. [name] asked if he could do the CBC at his pcp office as he has no co-pay there and has a $500 co-pay here. Advised that dR. Rosaline Coma is ok with this. Provided Fax # to this desk for results, advising that we do need them the same day as they were obtained. [name] voiced understanding.

## 2024-05-21 ENCOUNTER — Telehealth: Payer: Self-pay | Admitting: *Deleted

## 2024-05-21 ENCOUNTER — Inpatient Hospital Stay: Attending: Hematology and Oncology

## 2024-05-21 DIAGNOSIS — D693 Immune thrombocytopenic purpura: Secondary | ICD-10-CM | POA: Insufficient documentation

## 2024-05-21 DIAGNOSIS — Z87891 Personal history of nicotine dependence: Secondary | ICD-10-CM | POA: Insufficient documentation

## 2024-05-21 DIAGNOSIS — D696 Thrombocytopenia, unspecified: Secondary | ICD-10-CM

## 2024-05-21 LAB — CBC WITH DIFFERENTIAL (CANCER CENTER ONLY)
Abs Immature Granulocytes: 0.01 10*3/uL (ref 0.00–0.07)
Basophils Absolute: 0 10*3/uL (ref 0.0–0.1)
Basophils Relative: 0 %
Eosinophils Absolute: 0 10*3/uL (ref 0.0–0.5)
Eosinophils Relative: 0 %
HCT: 40.8 % (ref 39.0–52.0)
Hemoglobin: 14.5 g/dL (ref 13.0–17.0)
Immature Granulocytes: 0 %
Lymphocytes Relative: 36 %
Lymphs Abs: 2.3 10*3/uL (ref 0.7–4.0)
MCH: 30.3 pg (ref 26.0–34.0)
MCHC: 35.5 g/dL (ref 30.0–36.0)
MCV: 85.4 fL (ref 80.0–100.0)
Monocytes Absolute: 0.4 10*3/uL (ref 0.1–1.0)
Monocytes Relative: 6 %
Neutro Abs: 3.7 10*3/uL (ref 1.7–7.7)
Neutrophils Relative %: 58 %
Platelet Count: 56 10*3/uL — ABNORMAL LOW (ref 150–400)
RBC: 4.78 MIL/uL (ref 4.22–5.81)
RDW: 12.6 % (ref 11.5–15.5)
WBC Count: 6.4 10*3/uL (ref 4.0–10.5)
nRBC: 0 % (ref 0.0–0.2)

## 2024-05-21 NOTE — Telephone Encounter (Signed)
 Mr. Berthelot returned call. He said PCP cannot schedule his labs today and asked if he could have an appt here at [facility] to have labs done today. Scheduled Lab appt at 2p today. Confirmed time with patient.

## 2024-05-21 NOTE — Telephone Encounter (Signed)
 Received VM from patient - he misplaced this office's fax # he was given yesterday. His PCP is going to draw his lab tests today and patient will ask them to fax labs to Dr. Rosaline Coma.   TCT patient - LVM with office fax number.

## 2024-05-22 ENCOUNTER — Telehealth: Payer: Self-pay | Admitting: *Deleted

## 2024-05-22 MED ORDER — DEXAMETHASONE 4 MG PO TABS: 40.0000 mg | ORAL_TABLET | Freq: Every day | ORAL | 0 refills | Status: DC

## 2024-05-22 NOTE — Telephone Encounter (Signed)
 Received call back from pt. Advised that his platelet count dropped again to 56K  Advised that Dr. Rosaline Coma recommends another round of steroids.-Dexamethasone  40 mg daily x 4 days. Advised that this prescription has been sent in to his pharmacy. Pt is agreeable to this plan. He also notes that he will be able to have upcoming labs done at his PCP office and they will fax us  the results.

## 2024-05-22 NOTE — Telephone Encounter (Signed)
 TCT patient regarding lab results from yesterday. No answer but was able to leave vm message for pt to return this call to [phone]  Prescription for Dexamethasone  40 mg daily for 4 days escribed to pt's pharmacy due to continued low platelet count.

## 2024-05-24 DIAGNOSIS — R7309 Other abnormal glucose: Secondary | ICD-10-CM | POA: Diagnosis not present

## 2024-05-24 DIAGNOSIS — Z6838 Body mass index (BMI) 38.0-38.9, adult: Secondary | ICD-10-CM | POA: Diagnosis not present

## 2024-05-24 DIAGNOSIS — E669 Obesity, unspecified: Secondary | ICD-10-CM | POA: Diagnosis not present

## 2024-05-24 DIAGNOSIS — D696 Thrombocytopenia, unspecified: Secondary | ICD-10-CM | POA: Diagnosis not present

## 2024-05-28 DIAGNOSIS — D696 Thrombocytopenia, unspecified: Secondary | ICD-10-CM | POA: Diagnosis not present

## 2024-05-29 ENCOUNTER — Encounter: Payer: Self-pay | Admitting: Family Medicine

## 2024-05-29 ENCOUNTER — Other Ambulatory Visit: Payer: Self-pay | Admitting: Hematology and Oncology

## 2024-05-29 ENCOUNTER — Telehealth: Payer: Self-pay | Admitting: *Deleted

## 2024-05-29 DIAGNOSIS — D696 Thrombocytopenia, unspecified: Secondary | ICD-10-CM | POA: Diagnosis not present

## 2024-05-29 NOTE — Telephone Encounter (Signed)
 TCT patient regarding his platelet count from today. Spoke with him. He has these done @ his PCP office for cost effectiveness. Platelets 18k today. Pt's PCP called Dr. Rosaline Coma. Pt to start another dexamethasone  pulse dose today. Dr. Rosaline Coma advises pt to get IVIG as soon as possible.Advised that he has lab and MD appt set up for Monday and that we are waiting on scheduling for the IVIG. Pt states he is in line to pick up his Dexamethasone  now and is aware that his platelets have dropped down.  He is in agreement with the above plan. Advised to go to ED if he experiences any bleeding. Pt states he will. His wife is aware of his situation as well.

## 2024-05-30 ENCOUNTER — Encounter: Payer: Self-pay | Admitting: Family Medicine

## 2024-05-31 ENCOUNTER — Telehealth: Payer: Self-pay | Admitting: *Deleted

## 2024-05-31 DIAGNOSIS — D696 Thrombocytopenia, unspecified: Secondary | ICD-10-CM | POA: Diagnosis not present

## 2024-05-31 NOTE — Telephone Encounter (Signed)
 TCT patient regarding appts for next week. No answer. Left vm message for  pt and advised that his appts for Monday and Tuesday next week are all set as we discussed earlier in the day.. Call made to his  PCP office for his CBC results from this morning. They will be faxing those results to our fax # of [phone]

## 2024-06-02 NOTE — Progress Notes (Unsigned)
 [facility] Telephone:([phone]   [phone]  PROGRESS NOTE  Patient Care Team: Alejandro Hurt, FNP as PCP - General (Family Medicine)  Hematological/Oncological History # Autoimmune Thrombocytopenia/Neutropenia 04/01/2022: platelet transfusion for Plt of 8, WBC 2.2.  04/29/2022: bone marrow biopsy showed no evidence of underlying bone marrow disorder 05/31/2022: steroid taper that continued until Sept 2023.  10/25/2022: last visit with Dr. Dirk Fredericks 03/29/2023: transition care to Dr. Rosaline Coma  04/07/2023: Dex 40 mg x 4 days pulse. Patient's counts responded.  05/09/2024: Plt <5 during GI illness. Admitted to hospital.  6/12-6/15/2025: Dex 40 mg x 4 days pulse due to 2nd drop.   Interval History:  Mark Duarte 43 y.o. male with medical history significant for autoimmune thrombocytopenia/neutropenia who presents for a follow up visit. The patient's last visit was on 05/10/2023. In the interim since the last visit he was admitted to the hospital x 2 days for watery diarrhea and severe thrombocytopenia.   On exam today Mark Duarte reports he is glad to be done with his steroid pulse.  He finished his last dose yesterday.  He reports that around Saturday and Sunday the pills were driving him crazy.  He notes that he has had no trouble with bleeding, bruising, or dark stools.  He is not having any headache or vision changes.  Overall he is willing and able to proceed with IVIG therapy.  Unfortunately due to insurance issues we are not able to have it approved for today.  He is having difficulty taking off additional days from work and we will work on rescheduling him.  He otherwise denies any fevers, chills, sweats, nausea, vomiting or diarrhea.  A full 10 point ROS is otherwise negative.  MEDICAL HISTORY:  Past Medical History:  Diagnosis Date   Allergy    Asthma     SURGICAL HISTORY: Past Surgical History:  Procedure Laterality Date   CYST REMOVAL TRUNK       SOCIAL HISTORY: Social History   Socioeconomic History   Marital status: Single    Spouse name: Not on file   Number of children: Not on file   Years of education: Not on file   Highest education level: Not on file  Occupational History   Not on file  Tobacco Use   Smoking status: Former    Current packs/day: 0.00    Types: Cigarettes    Quit date: 10/11/2013    Years since quitting: 10.6   Smokeless tobacco: Never  Vaping Use   Vaping status: Never Used  Substance and Sexual Activity   Alcohol use: Yes    Alcohol/week: 5.0 standard drinks of alcohol    Types: 5 Standard drinks or equivalent per week    Comment: 2 beers/day 5 days a week   Drug use: No   Sexual activity: Not on file  Other Topics Concern   Not on file  Social History Narrative   Not on file   Social Drivers of Health   Financial Resource Strain: Not on file  Food Insecurity: No Food Insecurity (05/09/2024)   Hunger Vital Sign    Worried About Running Out of Food in the Last Year: Never true    Ran Out of Food in the Last Year: Never true  Transportation Needs: No Transportation Needs (05/09/2024)   PRAPARE - Administrator, Civil Service (Medical): No    Lack of Transportation (Non-Medical): No  Physical Activity: Not on file  Stress: Not on file  Social Connections: Not on file  Intimate Partner Violence: Not At Risk (05/09/2024)   Humiliation, Afraid, Rape, and Kick questionnaire    Fear of Current or Ex-Partner: No    Emotionally Abused: No    Physically Abused: No    Sexually Abused: No    FAMILY HISTORY: Family History  Problem Relation Age of Onset   Diabetes Mother    Hypertension Mother    High Cholesterol Mother    Hypertension Father    High Cholesterol Father    Cancer Maternal Grandmother    Heart disease Maternal Grandfather    Diabetes Maternal Grandfather    High Cholesterol Maternal Grandfather    Stroke Maternal Grandfather    Mental illness Maternal  Grandfather     ALLERGIES:  has no known allergies.  MEDICATIONS:  Current Outpatient Medications  Medication Sig Dispense Refill   acetaminophen  (TYLENOL ) 325 MG tablet Take 2 tablets (650 mg total) by mouth every 6 (six) hours as needed for mild pain (pain score 1-3) (or Fever >/= 101). 30 tablet 0   albuterol  (VENTOLIN  HFA) 108 (90 Base) MCG/ACT inhaler Inhale 2 puffs into the lungs every 4 (four) hours as needed for wheezing or shortness of breath (cough, shortness of breath or wheezing.). 1 each 1   dexamethasone  (DECADRON ) 4 MG tablet Take 10 tablets (40 mg total) by mouth daily. Take daily for 4 days. 40 tablet 0   Multiple Vitamins-Minerals (MULTIVITAL PO) Take 1 tablet by mouth daily.     No current facility-administered medications for this visit.    REVIEW OF SYSTEMS:   Constitutional: ( - ) fevers, ( - )  chills , ( - ) night sweats Eyes: ( - ) blurriness of vision, ( - ) double vision, ( - ) watery eyes Ears, nose, mouth, throat, and face: ( - ) mucositis, ( - ) sore throat Respiratory: ( - ) cough, ( - ) dyspnea, ( - ) wheezes Cardiovascular: ( - ) palpitation, ( - ) chest discomfort, ( - ) lower extremity swelling Gastrointestinal:  ( - ) nausea, ( - ) heartburn, ( - ) change in bowel habits Skin: ( - ) abnormal skin rashes Lymphatics: ( - ) new lymphadenopathy, ( - ) easy bruising Neurological: ( - ) numbness, ( - ) tingling, ( - ) new weaknesses Behavioral/Psych: ( - ) mood change, ( - ) new changes  All other systems were reviewed with the patient and are negative.  PHYSICAL EXAMINATION:  Vitals:   06/03/24 0905  BP: 128/75  Pulse: 73  Resp: 17  Temp: 98 F (36.7 C)  SpO2: 96%       Filed Weights   06/03/24 0905  Weight: 245 lb 12.8 oz (111.5 kg)        GENERAL: well-appearing middle-aged Caucasian male, alert, no distress and comfortable SKIN: skin color, texture, turgor are normal, no rashes or significant lesions EYES: conjunctiva are pink  and non-injected, sclera clear LUNGS: clear to auscultation and percussion with normal breathing effort HEART: regular rate & rhythm and no murmurs and no lower extremity edema Musculoskeletal: no cyanosis of digits and no clubbing  PSYCH: alert & oriented x 3, fluent speech NEURO: no focal motor/sensory deficits  LABORATORY DATA:  I have reviewed the data as listed    Latest Ref Rng & Units 06/03/2024    8:42 AM 05/21/2024    2:24 PM 05/17/2024   10:50 AM  CBC  WBC 4.0 - 10.5 K/uL 13.0  6.4  7.0   Hemoglobin 13.0 - 17.0 g/dL 16.1  09.6  04.5   Hematocrit 39.0 - 52.0 % 42.6  40.8  43.2   Platelets 150 - 400 K/uL 157  56  71        Latest Ref Rng & Units 05/10/2024    5:56 AM 05/09/2024   10:25 AM 11/09/2023    9:12 AM  CMP  Glucose 70 - 99 mg/dL 409  90  811   BUN 6 - 20 mg/dL 16  16  18    Creatinine 0.61 - 1.24 mg/dL 9.14  7.82  9.56   Sodium 135 - 145 mmol/L 138  139  138   Potassium 3.5 - 5.1 mmol/L 3.9  4.0  4.0   Chloride 98 - 111 mmol/L 109  107  105   CO2 22 - 32 mmol/L 23  27  28    Calcium 8.9 - 10.3 mg/dL 8.7  8.7  9.2   Total Protein 6.5 - 8.1 g/dL  6.8  6.7   Total Bilirubin 0.0 - 1.2 mg/dL  1.2  0.7   Alkaline Phos 38 - 126 U/L  55  64   AST 15 - 41 U/L  22  17   ALT 0 - 44 U/L  33  27     RADIOGRAPHIC STUDIES: No results found.   ASSESSMENT & PLAN Mark Duarte 43 y.o. male with medical history significant for autoimmune thrombocytopenia/neutropenia who presents for a follow up visit.   # Autoimmune Thrombocytopenia/Neutropenia -- Labs today show white blood cell count 13.0, hemoglobin 15.0, MCV 85.7, platelets 157 --adding IVIG due to drop in Plt after initial rise from steroid boost.  -- received Dexamethasone  40 mg PO daily x 4 days with daily labs, administered again due to drop in Plt post discharge from hospital.  -- strict return precautions for bleeding, headache, fatigue, or shortness of breath.  --Responded well to Dex 40 mg x 4 days in the  past  -- findings are consistent with autoimmune destruction of platelets/neutrophils. -- RTC on Monday of next week for IVIG.  Was intended for today but insurance was not approved in time.  No orders of the defined types were placed in this encounter.   All questions were answered. The patient knows to call the clinic with any problems, questions or concerns.  A total of more than 25 minutes were spent on this encounter with face-to-face time and non-face-to-face time, including preparing to see the patient, ordering tests and/or medications, counseling the patient and coordination of care as outlined above.   Rogerio Clay, MD Department of Hematology/Oncology [facility] at [facility] Phone: [phone] Pager: [phone] Email: [email or web address]  06/04/2024 10:14 AM

## 2024-06-03 ENCOUNTER — Inpatient Hospital Stay (HOSPITAL_BASED_OUTPATIENT_CLINIC_OR_DEPARTMENT_OTHER): Admitting: Hematology and Oncology

## 2024-06-03 ENCOUNTER — Inpatient Hospital Stay

## 2024-06-03 ENCOUNTER — Encounter: Payer: Self-pay | Admitting: Family Medicine

## 2024-06-03 ENCOUNTER — Telehealth: Payer: Self-pay | Admitting: Hematology and Oncology

## 2024-06-03 VITALS — BP 128/75 | HR 73 | Temp 98.0°F | Resp 17 | Wt 245.8 lb

## 2024-06-03 DIAGNOSIS — D696 Thrombocytopenia, unspecified: Secondary | ICD-10-CM | POA: Diagnosis not present

## 2024-06-03 DIAGNOSIS — Z87891 Personal history of nicotine dependence: Secondary | ICD-10-CM | POA: Diagnosis not present

## 2024-06-03 DIAGNOSIS — D693 Immune thrombocytopenic purpura: Secondary | ICD-10-CM | POA: Diagnosis not present

## 2024-06-03 DIAGNOSIS — D709 Neutropenia, unspecified: Secondary | ICD-10-CM

## 2024-06-03 LAB — CBC WITH DIFFERENTIAL (CANCER CENTER ONLY)
Abs Immature Granulocytes: 0.17 10*3/uL — ABNORMAL HIGH (ref 0.00–0.07)
Basophils Absolute: 0 10*3/uL (ref 0.0–0.1)
Basophils Relative: 0 %
Eosinophils Absolute: 0 10*3/uL (ref 0.0–0.5)
Eosinophils Relative: 0 %
HCT: 42.6 % (ref 39.0–52.0)
Hemoglobin: 15 g/dL (ref 13.0–17.0)
Immature Granulocytes: 1 %
Lymphocytes Relative: 15 %
Lymphs Abs: 2 10*3/uL (ref 0.7–4.0)
MCH: 30.2 pg (ref 26.0–34.0)
MCHC: 35.2 g/dL (ref 30.0–36.0)
MCV: 85.7 fL (ref 80.0–100.0)
Monocytes Absolute: 1.5 10*3/uL — ABNORMAL HIGH (ref 0.1–1.0)
Monocytes Relative: 11 %
Neutro Abs: 9.8 10*3/uL — ABNORMAL HIGH (ref 1.7–7.7)
Neutrophils Relative %: 73 %
Platelet Count: 157 10*3/uL (ref 150–400)
RBC: 4.97 MIL/uL (ref 4.22–5.81)
RDW: 12.6 % (ref 11.5–15.5)
WBC Count: 13 10*3/uL — ABNORMAL HIGH (ref 4.0–10.5)
nRBC: 0 % (ref 0.0–0.2)

## 2024-06-03 NOTE — Telephone Encounter (Signed)
 Rescheduled appointments per 6/16 secure chat. Talked with the patient and he is are of the changes made to his upcoming appointments.

## 2024-06-04 ENCOUNTER — Ambulatory Visit

## 2024-06-04 ENCOUNTER — Inpatient Hospital Stay

## 2024-06-04 ENCOUNTER — Encounter: Payer: Self-pay | Admitting: Hematology and Oncology

## 2024-06-05 ENCOUNTER — Ambulatory Visit

## 2024-06-07 DIAGNOSIS — D696 Thrombocytopenia, unspecified: Secondary | ICD-10-CM | POA: Diagnosis not present

## 2024-06-11 ENCOUNTER — Inpatient Hospital Stay

## 2024-06-11 VITALS — BP 118/92 | HR 78 | Temp 97.7°F | Resp 15 | Wt 249.5 lb

## 2024-06-11 DIAGNOSIS — D693 Immune thrombocytopenic purpura: Secondary | ICD-10-CM | POA: Diagnosis not present

## 2024-06-11 DIAGNOSIS — Z87891 Personal history of nicotine dependence: Secondary | ICD-10-CM | POA: Diagnosis not present

## 2024-06-11 DIAGNOSIS — D696 Thrombocytopenia, unspecified: Secondary | ICD-10-CM

## 2024-06-11 MED ORDER — ACETAMINOPHEN 325 MG PO TABS
650.0000 mg | ORAL_TABLET | Freq: Once | ORAL | Status: AC
  Administered 2024-06-11: 650 mg via ORAL
  Filled 2024-06-11: qty 2

## 2024-06-11 MED ORDER — DEXTROSE 5 % IV SOLN: INTRAVENOUS | Status: DC

## 2024-06-11 MED ORDER — SODIUM CHLORIDE 0.9% FLUSH
10.0000 mL | Freq: Once | INTRAVENOUS | Status: DC | PRN
Start: 2024-06-11 — End: 2024-06-11

## 2024-06-11 MED ORDER — DIPHENHYDRAMINE HCL 25 MG PO CAPS
25.0000 mg | ORAL_CAPSULE | Freq: Once | ORAL | Status: AC
  Administered 2024-06-11: 25 mg via ORAL
  Filled 2024-06-11: qty 1

## 2024-06-11 MED ORDER — IMMUNE GLOBULIN (HUMAN) 10 GM/100ML IV SOLN
120.0000 g | Freq: Once | INTRAVENOUS | Status: AC
  Administered 2024-06-11: 120 g via INTRAVENOUS
  Filled 2024-06-11: qty 1200

## 2024-06-11 NOTE — Patient Instructions (Signed)
 [facility] - A DEPT OF [facility]  Discharge Instructions: Thank you for choosing [facility] to provide your oncology and hematology care.   If you have a lab appointment with the Cancer Center, please go directly to the Cancer Center and check in at the registration area.   Wear comfortable clothing and clothing appropriate for easy access to any Portacath or PICC line.   We strive to give you quality time with your provider. You may need to reschedule your appointment if you arrive late (15 or more minutes).  Arriving late affects you and other patients whose appointments are after yours.  Also, if you miss three or more appointments without notifying the office, you may be dismissed from the clinic at the provider's discretion.      For prescription refill requests, have your pharmacy contact our office and allow 72 hours for refills to be completed.    Today you received the following chemotherapy and/or immunotherapy agents IVIG      To help prevent nausea and vomiting after your treatment, we encourage you to take your nausea medication as directed.  BELOW ARE SYMPTOMS THAT SHOULD BE REPORTED IMMEDIATELY: *FEVER GREATER THAN 100.4 F (38 C) OR HIGHER *CHILLS OR SWEATING *NAUSEA AND VOMITING THAT IS NOT CONTROLLED WITH YOUR NAUSEA MEDICATION *UNUSUAL SHORTNESS OF BREATH *UNUSUAL BRUISING OR BLEEDING *URINARY PROBLEMS (pain or burning when urinating, or frequent urination) *BOWEL PROBLEMS (unusual diarrhea, constipation, pain near the anus) TENDERNESS IN MOUTH AND THROAT WITH OR WITHOUT PRESENCE OF ULCERS (sore throat, sores in mouth, or a toothache) UNUSUAL RASH, SWELLING OR PAIN  UNUSUAL VAGINAL DISCHARGE OR ITCHING   Items with * indicate a potential emergency and should be followed up as soon as possible or go to the Emergency Department if any problems should occur.  Please show the CHEMOTHERAPY ALERT CARD or IMMUNOTHERAPY ALERT  CARD at check-in to the Emergency Department and triage nurse.  Should you have questions after your visit or need to cancel or reschedule your appointment, please contact [facility] - A DEPT OF [facility]  Dept: [phone]  and follow the prompts.  Office hours are 8:00 a.m. to 4:30 p.m. Monday - Friday. Please note that voicemails left after 4:00 p.m. may not be returned until the following business day.  We are closed weekends and major holidays. You have access to a nurse at all times for urgent questions. Please call the main number to the clinic Dept: [phone] and follow the prompts.   For any non-urgent questions, you may also contact your provider using MyChart. We now offer e-Visits for anyone 39 and older to request care online for non-urgent symptoms. For details visit [email or web address].   Also download the MyChart app! Go to the app store, search "MyChart", open the app, select [place], and log in with your MyChart username and password.

## 2024-06-12 ENCOUNTER — Inpatient Hospital Stay

## 2024-06-12 VITALS — BP 130/80 | HR 76 | Temp 97.9°F | Resp 16 | Wt 249.5 lb

## 2024-06-12 DIAGNOSIS — D696 Thrombocytopenia, unspecified: Secondary | ICD-10-CM

## 2024-06-12 DIAGNOSIS — D693 Immune thrombocytopenic purpura: Secondary | ICD-10-CM | POA: Diagnosis not present

## 2024-06-12 DIAGNOSIS — Z87891 Personal history of nicotine dependence: Secondary | ICD-10-CM | POA: Diagnosis not present

## 2024-06-12 MED ORDER — DEXTROSE 5 % IV SOLN: INTRAVENOUS | Status: DC

## 2024-06-12 MED ORDER — ACETAMINOPHEN 325 MG PO TABS
650.0000 mg | ORAL_TABLET | Freq: Once | ORAL | Status: AC
  Administered 2024-06-12: 650 mg via ORAL
  Filled 2024-06-12: qty 2

## 2024-06-12 MED ORDER — IMMUNE GLOBULIN (HUMAN) 10 GM/100ML IV SOLN
120.0000 g | Freq: Once | INTRAVENOUS | Status: AC
  Administered 2024-06-12: 120 g via INTRAVENOUS
  Filled 2024-06-12: qty 1200

## 2024-06-12 MED ORDER — DIPHENHYDRAMINE HCL 25 MG PO CAPS
25.0000 mg | ORAL_CAPSULE | Freq: Once | ORAL | Status: AC
  Administered 2024-06-12: 25 mg via ORAL
  Filled 2024-06-12: qty 1

## 2024-06-12 NOTE — Progress Notes (Signed)
 Pt declined to stay for full 30 min post IVIG observation period. Observed for 15 min post infusion. Tolerated well. No hx adverse s/s. VSS at time of d/c. Pt ambulated independently to lobby for d/c. AVS reviewed.

## 2024-06-12 NOTE — Patient Instructions (Signed)
 Immune Globulin Injection What is this medication? IMMUNE GLOBULIN (im MUNE GLOB yoo lin) treats many immune system conditions. It works by Designer, multimedia extra antibodies. Antibodies are proteins made by the immune system that help protect the body. This medicine may be used for other purposes; ask your health care provider or pharmacist if you have questions. COMMON BRAND NAME(S): ASCENIV, Baygam, BIVIGAM, Carimune, Carimune NF, cutaquig, Cuvitru, Flebogamma, Flebogamma DIF, GamaSTAN, GamaSTAN S/D, Gamimune N, Gammagard, Gammagard S/D, Gammaked, Gammaplex, Gammar-P IV, Gamunex, Gamunex-C, Hizentra, Iveegam, Iveegam EN, Octagam, Panglobulin, Panglobulin NF, panzyga, Polygam S/D, Privigen, Sandoglobulin, Venoglobulin-S, Vigam, Vivaglobulin, Xembify What should I tell my care team before I take this medication? They need to know if you have any of these conditions: Blood clotting disorder Condition where you have excess fluid in your body, such as heart failure or edema Dehydration Diabetes Have had blood clots Heart disease Immune system conditions Kidney disease Low levels of IgA Recent or upcoming vaccine An unusual or allergic reaction to immune globulin, other medications, foods, dyes, or preservatives Pregnant or trying to get pregnant Breastfeeding How should I use this medication? This medication is infused into a vein or under the skin. It is usually given by your care team in a hospital or clinic setting. It may also be given at home. If you get this medication at home, you will be taught how to prepare and give it. Use exactly as directed. Take it as directed on the prescription label at the same time every day. Keep taking it unless your care team tells you to stop. It is important that you put your used needles and syringes in a special sharps container. Do not put them in a trash can. If you do not have a sharps container, call your pharmacist or care team to get one. Talk to  your care team about the use of this medication in children. While it may be given to children for selected conditions, precautions do apply. Overdosage: If you think you have taken too much of this medicine contact a poison control center or emergency room at once. NOTE: This medicine is only for you. Do not share this medicine with others. What if I miss a dose? If you get this medication at the hospital or clinic: It is important not to miss your dose. Call your care team if you are unable to keep an appointment. If you give yourself this medication at home: If you miss a dose, take it as soon as you can. Then continue your normal schedule. If it is almost time for your next dose, take only that dose. Do not take double or extra doses. Call your care team with questions. What may interact with this medication? Live virus vaccines This list may not describe all possible interactions. Give your health care provider a list of all the medicines, herbs, non-prescription drugs, or dietary supplements you use. Also tell them if you smoke, drink alcohol, or use illegal drugs. Some items may interact with your medicine. What should I watch for while using this medication? Your condition will be monitored carefully while you are receiving this medication. Tell your care team if your symptoms do not start to get better or if they get worse. You may need blood work done while you are taking this medication. This medication increases the risk of blood clots. People with heart, blood vessel, or blood clotting conditions are more likely to develop a blood clot. Other risk factors include advanced age, estrogen  use, tobacco use, lack of movement, and being overweight. This medication can decrease the response to a vaccine. If you need to get vaccinated, tell your care team if you have received this medication within the last year. Extra booster doses may be needed. Talk to your care team to see if a different  vaccination schedule is needed. If you have diabetes, you may get a falsely elevated blood sugar reading. Talk to your care team about how to check your blood sugar while taking this medication. What side effects may I notice from receiving this medication? Side effects that you should report to your care team as soon as possible: Allergic reactions--skin rash, itching, hives, swelling of the face, lips, tongue, or throat Blood clot--pain, swelling, or warmth in the leg, shortness of breath, chest pain Fever, neck pain or stiffness, sensitivity to light, headache, nausea, vomiting, confusion, which may be signs of meningitis Hemolytic anemia--unusual weakness or fatigue, dizziness, headache, trouble breathing, dark urine, yellowing skin or eyes Kidney injury--decrease in the amount of urine, swelling of the ankles, hands, or feet Low sodium level--muscle weakness, fatigue, dizziness, headache, confusion Shortness of breath or trouble breathing, cough, unusual weakness or fatigue, blue skin or lips Side effects that usually do not require medical attention (report these to your care team if they continue or are bothersome): Chills Diarrhea Fever Headache Nausea This list may not describe all possible side effects. Call your doctor for medical advice about side effects. You may report side effects to FDA at 1-800-FDA-1088. Where should I keep my medication? Keep out of the reach of children and pets. You will be instructed on how to store this medication. Get rid of any unused medication after the expiration date. To get rid of medications that are no longer needed or have expired: Take the medication to a medication take-back program. Check with your pharmacy or law enforcement to find a location. If you cannot return the medication, ask your pharmacist or care team how to get rid of this medication safely. NOTE: This sheet is a summary. It may not cover all possible information. If you have  questions about this medicine, talk to your doctor, pharmacist, or health care provider.  2024 Elsevier/Gold Standard (2023-11-17 00:00:00)

## 2024-06-14 DIAGNOSIS — D696 Thrombocytopenia, unspecified: Secondary | ICD-10-CM | POA: Diagnosis not present

## 2024-06-17 ENCOUNTER — Encounter: Payer: Self-pay | Admitting: Family Medicine

## 2024-06-17 ENCOUNTER — Telehealth: Payer: Self-pay | Admitting: *Deleted

## 2024-06-17 NOTE — Telephone Encounter (Signed)
 TCT patient regarding labs from 06/14/24. Spoke with him. Platelets are 181. Advised that Dr. Federico is quite pleased with those results. Mark Duarte states he is too. He states he tolerated the IVIG well. No ill side effects. States he feels pretty good.  Advised to have CBC checked 1 x a week @ [facility] for now and that we will see him back in 4-6 weeks.  Advised that scheduling will call him for that appt. Shontez voiced understanding.

## 2024-06-26 ENCOUNTER — Telehealth: Payer: Self-pay | Admitting: Hematology and Oncology

## 2024-06-26 NOTE — Telephone Encounter (Signed)
 Rescheule appointment per provider pal.  Called left VM with changes made to the upcoming appointment.

## 2024-07-04 DIAGNOSIS — D696 Thrombocytopenia, unspecified: Secondary | ICD-10-CM | POA: Diagnosis not present

## 2024-07-05 ENCOUNTER — Encounter: Payer: Self-pay | Admitting: Family Medicine

## 2024-07-22 ENCOUNTER — Inpatient Hospital Stay: Attending: Hematology and Oncology

## 2024-07-22 ENCOUNTER — Inpatient Hospital Stay (HOSPITAL_BASED_OUTPATIENT_CLINIC_OR_DEPARTMENT_OTHER): Admitting: Hematology and Oncology

## 2024-07-22 VITALS — BP 134/84 | HR 86 | Temp 97.8°F | Resp 19 | Wt 258.7 lb

## 2024-07-22 DIAGNOSIS — D709 Neutropenia, unspecified: Secondary | ICD-10-CM

## 2024-07-22 DIAGNOSIS — D696 Thrombocytopenia, unspecified: Secondary | ICD-10-CM

## 2024-07-22 DIAGNOSIS — D693 Immune thrombocytopenic purpura: Secondary | ICD-10-CM | POA: Diagnosis not present

## 2024-07-22 LAB — CBC WITH DIFFERENTIAL (CANCER CENTER ONLY)
Abs Immature Granulocytes: 0.01 K/uL (ref 0.00–0.07)
Basophils Absolute: 0 K/uL (ref 0.0–0.1)
Basophils Relative: 0 %
Eosinophils Absolute: 0 K/uL (ref 0.0–0.5)
Eosinophils Relative: 0 %
HCT: 41.2 % (ref 39.0–52.0)
Hemoglobin: 14.9 g/dL (ref 13.0–17.0)
Immature Granulocytes: 0 %
Lymphocytes Relative: 32 %
Lymphs Abs: 1.9 K/uL (ref 0.7–4.0)
MCH: 31.2 pg (ref 26.0–34.0)
MCHC: 36.2 g/dL — ABNORMAL HIGH (ref 30.0–36.0)
MCV: 86.4 fL (ref 80.0–100.0)
Monocytes Absolute: 0.4 K/uL (ref 0.1–1.0)
Monocytes Relative: 6 %
Neutro Abs: 3.6 K/uL (ref 1.7–7.7)
Neutrophils Relative %: 62 %
Platelet Count: 154 K/uL (ref 150–400)
RBC: 4.77 MIL/uL (ref 4.22–5.81)
RDW: 13.1 % (ref 11.5–15.5)
WBC Count: 5.9 K/uL (ref 4.0–10.5)
nRBC: 0 % (ref 0.0–0.2)

## 2024-07-22 NOTE — Progress Notes (Signed)
 [facility] [phone]   [phone]  PROGRESS NOTE  Patient Care Team: Marvene Prentice SAUNDERS, FNP as PCP - General (Family Medicine)  Hematological/Oncological History # Autoimmune Thrombocytopenia/Neutropenia 04/01/2022: platelet transfusion for Plt of 8, WBC 2.2.  04/29/2022: bone marrow biopsy showed no evidence of underlying bone marrow disorder 05/31/2022: steroid taper that continued until Sept 2023.  10/25/2022: last visit with Dr. Amadeo 03/29/2023: transition care to Dr. Federico  04/07/2023: Dex 40 mg x 4 days pulse. Patient's counts responded.  05/09/2024: Plt <5 during GI illness. Admitted to hospital.  6/12-6/15/2025: Dex 40 mg x 4 days pulse due to 2nd drop.  6/24-6/25/2025: Patient received IVIG treatment 1 g/kg x 2 days.  Excellent response  Interval History:  Mark Duarte 43 y.o. male with medical history significant for autoimmune thrombocytopenia/neutropenia who presents for a follow up visit. The patient's last visit was on 06/03/2024. In the interim since the last visit he has had no further changes in his health.  On exam today [name] reports he had a low-key birthday in the interim since our last visit.  He notes that he has been going to bed early but otherwise has been feeling great with good energy levels.  He reports after he received the infusions he had a marked improvement in his symptoms.  He notes his energy levels have been strong.  He has had no further infectious symptoms such as fevers, chills, sweats, nausea, vomiting or diarrhea.  He denies any rash.  He reports he tolerated his infusions well with no major side effects.  He notes that he has had no trouble with bleeding, bruising, or dark stools since we last talked.  Even when I drew his blood today he reported he clotted quickly.  He notes that he is doing his best to try to stay away from infections.  He did gain some weight during the course of treatment with his weight  increasing up to 258 pounds.  He otherwise denies any other new symptoms.  A full 10 point ROS is otherwise negative.  MEDICAL HISTORY:  Past Medical History:  Diagnosis Date   Allergy    Asthma     SURGICAL HISTORY: Past Surgical History:  Procedure Laterality Date   CYST REMOVAL TRUNK      SOCIAL HISTORY: Social History   Socioeconomic History   Marital status: Single    Spouse name: Not on file   Number of children: Not on file   Years of education: Not on file   Highest education level: Not on file  Occupational History   Not on file  Tobacco Use   Smoking status: Former    Current packs/day: 0.00    Types: Cigarettes    Quit date: 10/11/2013    Years since quitting: 10.7   Smokeless tobacco: Never  Vaping Use   Vaping status: Never Used  Substance and Sexual Activity   Alcohol use: Yes    Alcohol/week: 5.0 standard drinks of alcohol    Types: 5 Standard drinks or equivalent per week    Comment: 2 beers/day 5 days a week   Drug use: No   Sexual activity: Not on file  Other Topics Concern   Not on file  Social History Narrative   Not on file   Social Drivers of Health   Financial Resource Strain: Not on file  Food Insecurity: No Food Insecurity (05/09/2024)   Hunger Vital Sign    Worried About Running Out of  Food in the Last Year: Never true    Ran Out of Food in the Last Year: Never true  Transportation Needs: No Transportation Needs (05/09/2024)   PRAPARE - Administrator, Civil Service (Medical): No    Lack of Transportation (Non-Medical): No  Physical Activity: Not on file  Stress: Not on file  Social Connections: Not on file  Intimate Partner Violence: Not At Risk (05/09/2024)   Humiliation, Afraid, Rape, and Kick questionnaire    Fear of Current or Ex-Partner: No    Emotionally Abused: No    Physically Abused: No    Sexually Abused: No    FAMILY HISTORY: Family History  Problem Relation Age of Onset   Diabetes Mother     Hypertension Mother    High Cholesterol Mother    Hypertension Father    High Cholesterol Father    Cancer Maternal Grandmother    Heart disease Maternal Grandfather    Diabetes Maternal Grandfather    High Cholesterol Maternal Grandfather    Stroke Maternal Grandfather    Mental illness Maternal Grandfather     ALLERGIES:  has no known allergies.  MEDICATIONS:  Current Outpatient Medications  Medication Sig Dispense Refill   albuterol  (VENTOLIN  HFA) 108 (90 Base) MCG/ACT inhaler Inhale 2 puffs into the lungs every 4 (four) hours as needed for wheezing or shortness of breath (cough, shortness of breath or wheezing.). 1 each 1   dexamethasone  (DECADRON ) 4 MG tablet Take 10 tablets (40 mg total) by mouth daily. Take daily for 4 days. 40 tablet 0   Multiple Vitamins-Minerals (MULTIVITAL PO) Take 1 tablet by mouth daily.     No current facility-administered medications for this visit.    REVIEW OF SYSTEMS:   Constitutional: ( - ) fevers, ( - )  chills , ( - ) night sweats Eyes: ( - ) blurriness of vision, ( - ) double vision, ( - ) watery eyes Ears, nose, mouth, throat, and face: ( - ) mucositis, ( - ) sore throat Respiratory: ( - ) cough, ( - ) dyspnea, ( - ) wheezes Cardiovascular: ( - ) palpitation, ( - ) chest discomfort, ( - ) lower extremity swelling Gastrointestinal:  ( - ) nausea, ( - ) heartburn, ( - ) change in bowel habits Skin: ( - ) abnormal skin rashes Lymphatics: ( - ) new lymphadenopathy, ( - ) easy bruising Neurological: ( - ) numbness, ( - ) tingling, ( - ) new weaknesses Behavioral/Psych: ( - ) mood change, ( - ) new changes  All other systems were reviewed with the patient and are negative.  PHYSICAL EXAMINATION:  Vitals:   07/22/24 1110  BP: 134/84  Pulse: 86  Resp: 19  Temp: 97.8 F (36.6 C)  SpO2: 97%    Filed Weights   07/22/24 1110  Weight: 258 lb 11.2 oz (117.3 kg)   GENERAL: well-appearing middle-aged Caucasian male, alert, no distress and  comfortable SKIN: skin color, texture, turgor are normal, no rashes or significant lesions EYES: conjunctiva are pink and non-injected, sclera clear LUNGS: clear to auscultation and percussion with normal breathing effort HEART: regular rate & rhythm and no murmurs and no lower extremity edema Musculoskeletal: no cyanosis of digits and no clubbing  PSYCH: alert & oriented x 3, fluent speech NEURO: no focal motor/sensory deficits  LABORATORY DATA:  I have reviewed the data as listed    Latest Ref Rng & Units 07/22/2024   10:47 AM 06/03/2024    8:42  AM 05/21/2024    2:24 PM  CBC  WBC 4.0 - 10.5 K/uL 5.9  13.0  6.4   Hemoglobin 13.0 - 17.0 g/dL 85.0  84.9  85.4   Hematocrit 39.0 - 52.0 % 41.2  42.6  40.8   Platelets 150 - 400 K/uL 154  157  56        Latest Ref Rng & Units 05/10/2024    5:56 AM 05/09/2024   10:25 AM 11/09/2023    9:12 AM  CMP  Glucose 70 - 99 mg/dL 831  90  878   BUN 6 - 20 mg/dL 16  16  18    Creatinine 0.61 - 1.24 mg/dL 9.13  8.90  8.87   Sodium 135 - 145 mmol/L 138  139  138   Potassium 3.5 - 5.1 mmol/L 3.9  4.0  4.0   Chloride 98 - 111 mmol/L 109  107  105   CO2 22 - 32 mmol/L 23  27  28    Calcium 8.9 - 10.3 mg/dL 8.7  8.7  9.2   Total Protein 6.5 - 8.1 g/dL  6.8  6.7   Total Bilirubin 0.0 - 1.2 mg/dL  1.2  0.7   Alkaline Phos 38 - 126 U/L  55  64   AST 15 - 41 U/L  22  17   ALT 0 - 44 U/L  33  27     RADIOGRAPHIC STUDIES: No results found.   ASSESSMENT & PLAN MYRAN ARCIA 42 y.o. male with medical history significant for autoimmune thrombocytopenia/neutropenia who presents for a follow up visit.   # Autoimmune Thrombocytopenia/Neutropenia -- Labs today show white blood cell count 5.9, hemoglobin 14.9, MCV 86.4, platelets 154 --adding IVIG due to drop in Plt after initial rise from steroid boost.  -- received Dexamethasone  40 mg PO daily x 4 days with daily labs in May 2025  -- strict return precautions for bleeding, headache, fatigue, or shortness  of breath.  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 -- RTC in 3 months to reassess.  Recommend monthly labs with his primary care provider  No orders of the defined types were placed in this encounter.   All questions were answered. The patient knows to call the clinic with any problems, questions or concerns.  A total of more than 25 minutes were spent on this encounter with face-to-face time and non-face-to-face time, including preparing to see the patient, ordering tests and/or medications, counseling the patient and coordination of care as outlined above.   Norleen IVAR Kidney, MD Department of Hematology/Oncology [facility] Phone: [phone] Pager: [phone] Email: [email or web address]  07/22/2024 9:41 PM

## 2024-08-05 DIAGNOSIS — J019 Acute sinusitis, unspecified: Secondary | ICD-10-CM | POA: Diagnosis not present

## 2024-08-05 DIAGNOSIS — R0981 Nasal congestion: Secondary | ICD-10-CM | POA: Diagnosis not present

## 2024-09-11 ENCOUNTER — Telehealth: Payer: Self-pay | Admitting: *Deleted

## 2024-09-11 NOTE — Telephone Encounter (Signed)
 Received vm message from pt regarding a letter he has received regarding authorization for his IVIG. He states that the letter advised him that the IVIG he received was not authorized and he wanted to talk about this. TCT patient. No answer but was able to leave detailed message on his identified phone vm. Advised that according to his information in his chart-the IVIG was indeed authorized. Advised that I would send a message to the person who does the authorizations to investigate this. Advised to all back at his convenience to [phone]

## 2024-10-14 DIAGNOSIS — R110 Nausea: Secondary | ICD-10-CM | POA: Diagnosis not present

## 2024-10-14 DIAGNOSIS — D696 Thrombocytopenia, unspecified: Secondary | ICD-10-CM | POA: Diagnosis not present

## 2024-10-15 ENCOUNTER — Telehealth: Payer: Self-pay | Admitting: *Deleted

## 2024-10-15 NOTE — Telephone Encounter (Addendum)
 Mr. Mark Duarte left vm: He saw his PCP yesterday for some symptoms. He wants to let Dr. Federico know about changes. He has an appt on Monday 11/3 with Dr. Federico.  PC to Mr. Mark Duarte - no answer, lvm that office was calling back. Requested he contact office.  Mr. Mark Duarte returned call. He said he's been experiencing intermittent nausea and PCP prescribed zofran  to take if needed. He said labs were done, platelets 30,000 and he's been having some bruising. His PCP prescribed dexamethasone  40 mg a day for 4 days. He will take last dose on Friday. He wants to know if Dr. Federico wants him to come for labs sooner than next appt. Dr. Federico informed.  Per Dr. Lafonda request, advised patient that he should finish rx of dexamethasone  and come for scheduled lab/MD appts on Monday 11/3. Also advised patient if increased bruising or bleeding over weekend to seek emergent care.  Mr. Mark Duarte verbalized understanding of all information.

## 2024-10-20 NOTE — Progress Notes (Unsigned)
 [facility] [phone]   Fax:(336) [phone]  PROGRESS NOTE  Patient Care Team: Marvene Prentice SAUNDERS, FNP as PCP - General (Family Medicine)  Hematological/Oncological History # Autoimmune Thrombocytopenia/Neutropenia 04/01/2022: platelet transfusion for Plt of 8, WBC 2.2.  04/29/2022: bone marrow biopsy showed no evidence of underlying bone marrow disorder 05/31/2022: steroid taper that continued until Sept 2023.  10/25/2022: last visit with Dr. Amadeo 03/29/2023: transition care to Dr. Federico  04/07/2023: Dex 40 mg x 4 days pulse. Patient's counts responded.  05/09/2024: Plt <5 during GI illness. Admitted to hospital.  6/12-6/15/2025: Dex 40 mg x 4 days pulse due to 2nd drop.  6/24-6/25/2025: Patient received IVIG treatment 1 g/kg x 2 days.  Excellent response  Interval History:  Mark Duarte 43 y.o. male with medical history significant for autoimmune thrombocytopenia/neutropenia who presents for a follow up visit. The patient's last visit was on 07/22/2024. In the interim since the last visit he has had no further changes in his health.  On exam today Mark Duarte reports***  MEDICAL HISTORY:  Past Medical History:  Diagnosis Date   Allergy    Asthma     SURGICAL HISTORY: Past Surgical History:  Procedure Laterality Date   CYST REMOVAL TRUNK      SOCIAL HISTORY: Social History   Socioeconomic History   Marital status: Single    Spouse name: Not on file   Number of children: Not on file   Years of education: Not on file   Highest education level: Not on file  Occupational History   Not on file  Tobacco Use   Smoking status: Former    Current packs/day: 0.00    Types: Cigarettes    Quit date: 10/11/2013    Years since quitting: 11.0   Smokeless tobacco: Never  Vaping Use   Vaping status: Never Used  Substance and Sexual Activity   Alcohol use: Yes    Alcohol/week: 5.0 standard drinks of alcohol    Types: 5 Standard drinks or equivalent  per week    Comment: 2 beers/day 5 days a week   Drug use: No   Sexual activity: Not on file  Other Topics Concern   Not on file  Social History Narrative   Not on file   Social Drivers of Health   Financial Resource Strain: Not on file  Food Insecurity: No Food Insecurity (05/09/2024)   Hunger Vital Sign    Worried About Running Out of Food in the Last Year: Never true    Ran Out of Food in the Last Year: Never true  Transportation Needs: No Transportation Needs (05/09/2024)   PRAPARE - Administrator, Civil Service (Medical): No    Lack of Transportation (Non-Medical): No  Physical Activity: Not on file  Stress: Not on file  Social Connections: Not on file  Intimate Partner Violence: Not At Risk (05/09/2024)   Humiliation, Afraid, Rape, and Kick questionnaire    Fear of Current or Ex-Partner: No    Emotionally Abused: No    Physically Abused: No    Sexually Abused: No    FAMILY HISTORY: Family History  Problem Relation Age of Onset   Diabetes Mother    Hypertension Mother    High Cholesterol Mother    Hypertension Father    High Cholesterol Father    Cancer Maternal Grandmother    Heart disease Maternal Grandfather    Diabetes Maternal Grandfather    High Cholesterol Maternal Grandfather    Stroke  Maternal Grandfather    Mental illness Maternal Grandfather     ALLERGIES:  has no known allergies.  MEDICATIONS:  Current Outpatient Medications  Medication Sig Dispense Refill   albuterol  (VENTOLIN  HFA) 108 (90 Base) MCG/ACT inhaler Inhale 2 puffs into the lungs every 4 (four) hours as needed for wheezing or shortness of breath (cough, shortness of breath or wheezing.). 1 each 1   dexamethasone  (DECADRON ) 4 MG tablet Take 10 tablets (40 mg total) by mouth daily. Take daily for 4 days. 40 tablet 0   Multiple Vitamins-Minerals (MULTIVITAL PO) Take 1 tablet by mouth daily.     No current facility-administered medications for this visit.    REVIEW OF  SYSTEMS:   Constitutional: ( - ) fevers, ( - )  chills , ( - ) night sweats Eyes: ( - ) blurriness of vision, ( - ) double vision, ( - ) watery eyes Ears, nose, mouth, throat, and face: ( - ) mucositis, ( - ) sore throat Respiratory: ( - ) cough, ( - ) dyspnea, ( - ) wheezes Cardiovascular: ( - ) palpitation, ( - ) chest discomfort, ( - ) lower extremity swelling Gastrointestinal:  ( - ) nausea, ( - ) heartburn, ( - ) change in bowel habits Skin: ( - ) abnormal skin rashes Lymphatics: ( - ) new lymphadenopathy, ( - ) easy bruising Neurological: ( - ) numbness, ( - ) tingling, ( - ) new weaknesses Behavioral/Psych: ( - ) mood change, ( - ) new changes  All other systems were reviewed with the patient and are negative.  PHYSICAL EXAMINATION:  There were no vitals filed for this visit.   There were no vitals filed for this visit.  GENERAL: well-appearing middle-aged Caucasian male, alert, no distress and comfortable SKIN: skin color, texture, turgor are normal, no rashes or significant lesions EYES: conjunctiva are pink and non-injected, sclera clear LUNGS: clear to auscultation and percussion with normal breathing effort HEART: regular rate & rhythm and no murmurs and no lower extremity edema Musculoskeletal: no cyanosis of digits and no clubbing  PSYCH: alert & oriented x 3, fluent speech NEURO: no focal motor/sensory deficits  LABORATORY DATA:  I have reviewed the data as listed    Latest Ref Rng & Units 07/22/2024   10:47 AM 06/03/2024    8:42 AM 05/21/2024    2:24 PM  CBC  WBC 4.0 - 10.5 K/uL 5.9  13.0  6.4   Hemoglobin 13.0 - 17.0 g/dL 85.0  84.9  85.4   Hematocrit 39.0 - 52.0 % 41.2  42.6  40.8   Platelets 150 - 400 K/uL 154  157  56        Latest Ref Rng & Units 05/10/2024    5:56 AM 05/09/2024   10:25 AM 11/09/2023    9:12 AM  CMP  Glucose 70 - 99 mg/dL 831  90  878   BUN 6 - 20 mg/dL 16  16  18    Creatinine 0.61 - 1.24 mg/dL 9.13  8.90  8.87   Sodium 135 - 145  mmol/L 138  139  138   Potassium 3.5 - 5.1 mmol/L 3.9  4.0  4.0   Chloride 98 - 111 mmol/L 109  107  105   CO2 22 - 32 mmol/L 23  27  28    Calcium 8.9 - 10.3 mg/dL 8.7  8.7  9.2   Total Protein 6.5 - 8.1 g/dL  6.8  6.7   Total Bilirubin 0.0 -  1.2 mg/dL  1.2  0.7   Alkaline Phos 38 - 126 U/L  55  64   AST 15 - 41 U/L  22  17   ALT 0 - 44 U/L  33  27     RADIOGRAPHIC STUDIES: No results found.   ASSESSMENT & PLAN Mark Duarte 43 y.o. male with medical history significant for autoimmune thrombocytopenia/neutropenia who presents for a follow up visit.   # Autoimmune Thrombocytopenia/Neutropenia -- Labs today show white blood cell count *** --adding IVIG due to drop in Plt after initial rise from steroid boost.  -- received Dexamethasone  40 mg PO daily x 4 days with daily labs in May 2025  -- strict return precautions for bleeding, headache, fatigue, or shortness of breath.  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 -- RTC in 3 months to reassess.  Recommend monthly labs with his primary care provider  No orders of the defined types were placed in this encounter.   All questions were answered. The patient knows to call the clinic with any problems, questions or concerns.  A total of more than 25 minutes were spent on this encounter with face-to-face time and non-face-to-face time, including preparing to see the patient, ordering tests and/or medications, counseling the patient and coordination of care as outlined above.   Norleen IVAR Kidney, MD Department of Hematology/Oncology [facility] Phone: [phone] Pager: [phone] Email: [email or web address]  10/20/2024 7:07 PM

## 2024-10-21 ENCOUNTER — Inpatient Hospital Stay (HOSPITAL_BASED_OUTPATIENT_CLINIC_OR_DEPARTMENT_OTHER): Admitting: Hematology and Oncology

## 2024-10-21 ENCOUNTER — Inpatient Hospital Stay: Attending: Hematology and Oncology

## 2024-10-21 VITALS — BP 130/75 | HR 76 | Temp 97.5°F | Resp 17 | Ht 67.0 in | Wt 253.0 lb

## 2024-10-21 DIAGNOSIS — D709 Neutropenia, unspecified: Secondary | ICD-10-CM

## 2024-10-21 DIAGNOSIS — D696 Thrombocytopenia, unspecified: Secondary | ICD-10-CM | POA: Diagnosis not present

## 2024-10-21 DIAGNOSIS — D693 Immune thrombocytopenic purpura: Secondary | ICD-10-CM | POA: Diagnosis not present

## 2024-10-21 LAB — CBC WITH DIFFERENTIAL (CANCER CENTER ONLY)
Abs Immature Granulocytes: 0.18 K/uL — ABNORMAL HIGH (ref 0.00–0.07)
Basophils Absolute: 0 K/uL (ref 0.0–0.1)
Basophils Relative: 0 %
Eosinophils Absolute: 1 K/uL — ABNORMAL HIGH (ref 0.0–0.5)
Eosinophils Relative: 10 %
HCT: 43.6 % (ref 39.0–52.0)
Hemoglobin: 16.1 g/dL (ref 13.0–17.0)
Immature Granulocytes: 2 %
Lymphocytes Relative: 33 %
Lymphs Abs: 3.2 K/uL (ref 0.7–4.0)
MCH: 30.7 pg (ref 26.0–34.0)
MCHC: 36.9 g/dL — ABNORMAL HIGH (ref 30.0–36.0)
MCV: 83 fL (ref 80.0–100.0)
Monocytes Absolute: 0.6 K/uL (ref 0.1–1.0)
Monocytes Relative: 7 %
Neutro Abs: 4.7 K/uL (ref 1.7–7.7)
Neutrophils Relative %: 48 %
Platelet Count: 88 K/uL — ABNORMAL LOW (ref 150–400)
RBC: 5.25 MIL/uL (ref 4.22–5.81)
RDW: 12.8 % (ref 11.5–15.5)
WBC Count: 9.8 K/uL (ref 4.0–10.5)
nRBC: 0 % (ref 0.0–0.2)

## 2024-10-28 ENCOUNTER — Inpatient Hospital Stay

## 2024-10-28 DIAGNOSIS — D693 Immune thrombocytopenic purpura: Secondary | ICD-10-CM | POA: Diagnosis not present

## 2024-10-28 DIAGNOSIS — D696 Thrombocytopenia, unspecified: Secondary | ICD-10-CM

## 2024-10-28 LAB — CBC WITH DIFFERENTIAL (CANCER CENTER ONLY)
Abs Immature Granulocytes: 0.01 K/uL (ref 0.00–0.07)
Basophils Absolute: 0 K/uL (ref 0.0–0.1)
Basophils Relative: 0 %
Eosinophils Absolute: 0.1 K/uL (ref 0.0–0.5)
Eosinophils Relative: 1 %
HCT: 42.5 % (ref 39.0–52.0)
Hemoglobin: 15.1 g/dL (ref 13.0–17.0)
Immature Granulocytes: 0 %
Lymphocytes Relative: 21 %
Lymphs Abs: 1.6 K/uL (ref 0.7–4.0)
MCH: 30.2 pg (ref 26.0–34.0)
MCHC: 35.5 g/dL (ref 30.0–36.0)
MCV: 85 fL (ref 80.0–100.0)
Monocytes Absolute: 0.5 K/uL (ref 0.1–1.0)
Monocytes Relative: 7 %
Neutro Abs: 5.3 K/uL (ref 1.7–7.7)
Neutrophils Relative %: 71 %
Platelet Count: 41 K/uL — ABNORMAL LOW (ref 150–400)
RBC: 5 MIL/uL (ref 4.22–5.81)
RDW: 12.5 % (ref 11.5–15.5)
WBC Count: 7.5 K/uL (ref 4.0–10.5)
nRBC: 0 % (ref 0.0–0.2)

## 2024-11-04 ENCOUNTER — Other Ambulatory Visit: Payer: Self-pay | Admitting: *Deleted

## 2024-11-04 ENCOUNTER — Inpatient Hospital Stay

## 2024-11-04 ENCOUNTER — Telehealth: Payer: Self-pay | Admitting: *Deleted

## 2024-11-04 DIAGNOSIS — D696 Thrombocytopenia, unspecified: Secondary | ICD-10-CM

## 2024-11-04 DIAGNOSIS — D693 Immune thrombocytopenic purpura: Secondary | ICD-10-CM | POA: Diagnosis not present

## 2024-11-04 LAB — CBC WITH DIFFERENTIAL (CANCER CENTER ONLY)
Abs Immature Granulocytes: 0.01 K/uL (ref 0.00–0.07)
Basophils Absolute: 0 K/uL (ref 0.0–0.1)
Basophils Relative: 0 %
Eosinophils Absolute: 0 K/uL (ref 0.0–0.5)
Eosinophils Relative: 1 %
HCT: 41.6 % (ref 39.0–52.0)
Hemoglobin: 14.8 g/dL (ref 13.0–17.0)
Immature Granulocytes: 0 %
Lymphocytes Relative: 31 %
Lymphs Abs: 1.4 K/uL (ref 0.7–4.0)
MCH: 30.3 pg (ref 26.0–34.0)
MCHC: 35.6 g/dL (ref 30.0–36.0)
MCV: 85.1 fL (ref 80.0–100.0)
Monocytes Absolute: 0.3 K/uL (ref 0.1–1.0)
Monocytes Relative: 8 %
Neutro Abs: 2.7 K/uL (ref 1.7–7.7)
Neutrophils Relative %: 60 %
Platelet Count: 31 K/uL — ABNORMAL LOW (ref 150–400)
RBC: 4.89 MIL/uL (ref 4.22–5.81)
RDW: 12.8 % (ref 11.5–15.5)
WBC Count: 4.4 K/uL (ref 4.0–10.5)
nRBC: 0 % (ref 0.0–0.2)

## 2024-11-04 MED ORDER — DEXAMETHASONE 4 MG PO TABS: 40.0000 mg | ORAL_TABLET | Freq: Every day | ORAL | 0 refills | Status: AC

## 2024-11-04 NOTE — Telephone Encounter (Signed)
 TCT patient regarding lab results from this morning. No answer but was able to leave vm message for pt to return call to [phone] to review labs, discuss next steps which include another pulse dose of dexamethasone . Refill for Dex has been sent to pt's pharmacy.

## 2024-11-04 NOTE — Telephone Encounter (Signed)
 See previous note

## 2024-11-05 ENCOUNTER — Other Ambulatory Visit: Payer: Self-pay | Admitting: Hematology and Oncology

## 2024-11-05 ENCOUNTER — Telehealth: Payer: Self-pay

## 2024-11-05 NOTE — Telephone Encounter (Signed)
 The patient called seeking clarification regarding his scheduled appointments. He is confirmed to be scheduled at [facility] on Thursday and Friday of this week for laboratory testing and infusion.

## 2024-11-07 ENCOUNTER — Inpatient Hospital Stay

## 2024-11-07 ENCOUNTER — Other Ambulatory Visit: Payer: Self-pay | Admitting: Hematology and Oncology

## 2024-11-07 VITALS — BP 111/66 | HR 77 | Temp 98.1°F | Resp 18 | Wt 254.1 lb

## 2024-11-07 DIAGNOSIS — D696 Thrombocytopenia, unspecified: Secondary | ICD-10-CM

## 2024-11-07 DIAGNOSIS — D693 Immune thrombocytopenic purpura: Secondary | ICD-10-CM | POA: Diagnosis not present

## 2024-11-07 LAB — CMP (CANCER CENTER ONLY)
ALT: 30 U/L (ref 0–44)
AST: 14 U/L — ABNORMAL LOW (ref 15–41)
Albumin: 4.4 g/dL (ref 3.5–5.0)
Alkaline Phosphatase: 69 U/L (ref 38–126)
Anion gap: 13 (ref 5–15)
BUN: 20 mg/dL (ref 6–20)
CO2: 21 mmol/L — ABNORMAL LOW (ref 22–32)
Calcium: 9.5 mg/dL (ref 8.9–10.3)
Chloride: 104 mmol/L (ref 98–111)
Creatinine: 0.97 mg/dL (ref 0.61–1.24)
GFR, Estimated: >60 mL/min (ref >60)
Glucose, Bld: 201 mg/dL — ABNORMAL HIGH (ref 70–99)
Potassium: 4.3 mmol/L (ref 3.5–5.1)
Sodium: 138 mmol/L (ref 135–145)
Total Bilirubin: 0.5 mg/dL (ref 0.0–1.2)
Total Protein: 6.6 g/dL (ref 6.5–8.1)

## 2024-11-07 LAB — CBC WITH DIFFERENTIAL (CANCER CENTER ONLY)
Abs Immature Granulocytes: 0.02 K/uL (ref 0.00–0.07)
Basophils Absolute: 0 K/uL (ref 0.0–0.1)
Basophils Relative: 0 %
Eosinophils Absolute: 0 K/uL (ref 0.0–0.5)
Eosinophils Relative: 0 %
HCT: 39.4 % (ref 39.0–52.0)
Hemoglobin: 14.1 g/dL (ref 13.0–17.0)
Immature Granulocytes: 0 %
Lymphocytes Relative: 12 %
Lymphs Abs: 0.8 K/uL (ref 0.7–4.0)
MCH: 30.5 pg (ref 26.0–34.0)
MCHC: 35.8 g/dL (ref 30.0–36.0)
MCV: 85.3 fL (ref 80.0–100.0)
Monocytes Absolute: 0.7 K/uL (ref 0.1–1.0)
Monocytes Relative: 10 %
Neutro Abs: 5.3 K/uL (ref 1.7–7.7)
Neutrophils Relative %: 78 %
Platelet Count: 118 K/uL — ABNORMAL LOW (ref 150–400)
RBC: 4.62 MIL/uL (ref 4.22–5.81)
RDW: 12.8 % (ref 11.5–15.5)
WBC Count: 6.9 K/uL (ref 4.0–10.5)
nRBC: 0 % (ref 0.0–0.2)

## 2024-11-07 MED ORDER — DEXTROSE 5 % IV SOLN: INTRAVENOUS | Status: DC

## 2024-11-07 MED ORDER — IMMUNE GLOBULIN (HUMAN) 10 GM/100ML IV SOLN
120.0000 g | Freq: Once | INTRAVENOUS | Status: AC
  Administered 2024-11-07: 120 g via INTRAVENOUS
  Filled 2024-11-07: qty 1200

## 2024-11-07 MED ORDER — DIPHENHYDRAMINE HCL 25 MG PO CAPS
25.0000 mg | ORAL_CAPSULE | Freq: Once | ORAL | Status: AC
  Administered 2024-11-07: 25 mg via ORAL
  Filled 2024-11-07: qty 1

## 2024-11-07 MED ORDER — ACETAMINOPHEN 325 MG PO TABS
650.0000 mg | ORAL_TABLET | Freq: Once | ORAL | Status: AC
  Administered 2024-11-07: 650 mg via ORAL
  Filled 2024-11-07: qty 2

## 2024-11-07 NOTE — Patient Instructions (Signed)
 Immune Globulin  Injection What is this medication? IMMUNE GLOBULIN  (im MUNE GLOB yoo lin) treats many immune system conditions. It works by Designer, multimedia extra antibodies. Antibodies are proteins made by the immune system that help protect the body. This medicine may be used for other purposes; ask your health care provider or pharmacist if you have questions. COMMON BRAND NAME(S): ASCENIV, Baygam, BIVIGAM, Carimune, Carimune NF, cutaquig, Cuvitru, Flebogamma, Flebogamma DIF, GamaSTAN, GamaSTAN S/D, Gamimune N, Gammagard, Gammagard S/D, Gammaked, Gammaplex, Gammar-P IV, Gamunex, Gamunex-C, Hizentra, Iveegam, Iveegam EN, Octagam, Panglobulin, Panglobulin NF, panzyga, Polygam S/D, Privigen , Sandoglobulin, Venoglobulin-S, Vigam, Vivaglobulin, Xembify What should I tell my care team before I take this medication? They need to know if you have any of these conditions: Blood clotting disorder Condition where you have excess fluid in your body, such as heart failure or edema Dehydration Diabetes Have had blood clots Heart disease Immune system conditions Kidney disease Low levels of IgA Recent or upcoming vaccine An unusual or allergic reaction to immune globulin , other medications, foods, dyes, or preservatives Pregnant or trying to get pregnant Breastfeeding How should I use this medication? This medication is infused into a vein or under the skin. It is usually given by your care team in a hospital or clinic setting. It may also be given at home. If you get this medication at home, you will be taught how to prepare and give it. Use exactly as directed. Take it as directed on the prescription label at the same time every day. Keep taking it unless your care team tells you to stop. It is important that you put your used needles and syringes in a special sharps container. Do not put them in a trash can. If you do not have a sharps container, call your pharmacist or care team to get one. Talk to  your care team about the use of this medication in children. While it may be given to children for selected conditions, precautions do apply. Overdosage: If you think you have taken too much of this medicine contact a poison control center or emergency room at once. NOTE: This medicine is only for you. Do not share this medicine with others. What if I miss a dose? If you get this medication at the hospital or clinic: It is important not to miss your dose. Call your care team if you are unable to keep an appointment. If you give yourself this medication at home: If you miss a dose, take it as soon as you can. Then continue your normal schedule. If it is almost time for your next dose, take only that dose. Do not take double or extra doses. Call your care team with questions. What may interact with this medication? Live virus vaccines This list may not describe all possible interactions. Give your health care provider a list of all the medicines, herbs, non-prescription drugs, or dietary supplements you use. Also tell them if you smoke, drink alcohol, or use illegal drugs. Some items may interact with your medicine. What should I watch for while using this medication? Your condition will be monitored carefully while you are receiving this medication. Tell your care team if your symptoms do not start to get better or if they get worse. You may need blood work done while you are taking this medication. This medication increases the risk of blood clots. People with heart, blood vessel, or blood clotting conditions are more likely to develop a blood clot. Other risk factors include advanced age, estrogen  use, tobacco use, lack of movement, and being overweight. This medication can decrease the response to a vaccine. If you need to get vaccinated, tell your care team if you have received this medication within the last year. Extra booster doses may be needed. Talk to your care team to see if a different  vaccination schedule is needed. If you have diabetes, you may get a falsely elevated blood sugar reading. Talk to your care team about how to check your blood sugar while taking this medication. What side effects may I notice from receiving this medication? Side effects that you should report to your care team as soon as possible: Allergic reactions--skin rash, itching, hives, swelling of the face, lips, tongue, or throat Blood clot--pain, swelling, or warmth in the leg, shortness of breath, chest pain Fever, neck pain or stiffness, sensitivity to light, headache, nausea, vomiting, confusion, which may be signs of meningitis Hemolytic anemia--unusual weakness or fatigue, dizziness, headache, trouble breathing, dark urine, yellowing skin or eyes Kidney injury--decrease in the amount of urine, swelling of the ankles, hands, or feet Low sodium level--muscle weakness, fatigue, dizziness, headache, confusion Shortness of breath or trouble breathing, cough, unusual weakness or fatigue, blue skin or lips Side effects that usually do not require medical attention (report these to your care team if they continue or are bothersome): Chills Diarrhea Fever Headache Nausea This list may not describe all possible side effects. Call your doctor for medical advice about side effects. You may report side effects to FDA at 1-800-FDA-1088. Where should I keep my medication? Keep out of the reach of children and pets. You will be instructed on how to store this medication. Get rid of any unused medication after the expiration date. To get rid of medications that are no longer needed or have expired: Take the medication to a medication take-back program. Check with your pharmacy or law enforcement to find a location. If you cannot return the medication, ask your pharmacist or care team how to get rid of this medication safely. NOTE: This sheet is a summary. It may not cover all possible information. If you have  questions about this medicine, talk to your doctor, pharmacist, or health care provider.  2024 Elsevier/Gold Standard (2023-11-17 00:00:00)

## 2024-11-08 ENCOUNTER — Inpatient Hospital Stay

## 2024-11-08 ENCOUNTER — Other Ambulatory Visit: Payer: Self-pay | Admitting: Hematology and Oncology

## 2024-11-08 VITALS — BP 123/68 | HR 70 | Temp 98.2°F | Resp 18

## 2024-11-08 DIAGNOSIS — D693 Immune thrombocytopenic purpura: Secondary | ICD-10-CM | POA: Diagnosis not present

## 2024-11-08 DIAGNOSIS — D696 Thrombocytopenia, unspecified: Secondary | ICD-10-CM

## 2024-11-08 MED ORDER — IMMUNE GLOBULIN (HUMAN) 10 GM/100ML IV SOLN
120.0000 g | Freq: Once | INTRAVENOUS | Status: AC
  Administered 2024-11-08: 120 g via INTRAVENOUS
  Filled 2024-11-08: qty 1200

## 2024-11-08 MED ORDER — ACETAMINOPHEN 325 MG PO TABS
650.0000 mg | ORAL_TABLET | Freq: Once | ORAL | Status: AC
  Administered 2024-11-08: 650 mg via ORAL
  Filled 2024-11-08: qty 2

## 2024-11-08 MED ORDER — DIPHENHYDRAMINE HCL 25 MG PO CAPS
25.0000 mg | ORAL_CAPSULE | Freq: Once | ORAL | Status: AC
  Administered 2024-11-08: 25 mg via ORAL
  Filled 2024-11-08: qty 1

## 2024-11-08 MED ORDER — DEXTROSE 5 % IV SOLN: INTRAVENOUS | Status: DC

## 2024-11-08 NOTE — Patient Instructions (Signed)
 Immune Globulin  Injection What is this medication? IMMUNE GLOBULIN  (im MUNE GLOB yoo lin) treats many immune system conditions. It works by Designer, multimedia extra antibodies. Antibodies are proteins made by the immune system that help protect the body. This medicine may be used for other purposes; ask your health care provider or pharmacist if you have questions. COMMON BRAND NAME(S): ASCENIV, Baygam, BIVIGAM, Carimune, Carimune NF, cutaquig, Cuvitru, Flebogamma, Flebogamma DIF, GamaSTAN, GamaSTAN S/D, Gamimune N, Gammagard, Gammagard S/D, Gammaked, Gammaplex, Gammar-P IV, Gamunex, Gamunex-C, Hizentra, Iveegam, Iveegam EN, Octagam, Panglobulin, Panglobulin NF, panzyga, Polygam S/D, Privigen , Sandoglobulin, Venoglobulin-S, Vigam, Vivaglobulin, Xembify What should I tell my care team before I take this medication? They need to know if you have any of these conditions: Blood clotting disorder Condition where you have excess fluid in your body, such as heart failure or edema Dehydration Diabetes Have had blood clots Heart disease Immune system conditions Kidney disease Low levels of IgA Recent or upcoming vaccine An unusual or allergic reaction to immune globulin , other medications, foods, dyes, or preservatives Pregnant or trying to get pregnant Breastfeeding How should I use this medication? This medication is infused into a vein or under the skin. It is usually given by your care team in a hospital or clinic setting. It may also be given at home. If you get this medication at home, you will be taught how to prepare and give it. Use exactly as directed. Take it as directed on the prescription label at the same time every day. Keep taking it unless your care team tells you to stop. It is important that you put your used needles and syringes in a special sharps container. Do not put them in a trash can. If you do not have a sharps container, call your pharmacist or care team to get one. Talk to  your care team about the use of this medication in children. While it may be given to children for selected conditions, precautions do apply. Overdosage: If you think you have taken too much of this medicine contact a poison control center or emergency room at once. NOTE: This medicine is only for you. Do not share this medicine with others. What if I miss a dose? If you get this medication at the hospital or clinic: It is important not to miss your dose. Call your care team if you are unable to keep an appointment. If you give yourself this medication at home: If you miss a dose, take it as soon as you can. Then continue your normal schedule. If it is almost time for your next dose, take only that dose. Do not take double or extra doses. Call your care team with questions. What may interact with this medication? Live virus vaccines This list may not describe all possible interactions. Give your health care provider a list of all the medicines, herbs, non-prescription drugs, or dietary supplements you use. Also tell them if you smoke, drink alcohol, or use illegal drugs. Some items may interact with your medicine. What should I watch for while using this medication? Your condition will be monitored carefully while you are receiving this medication. Tell your care team if your symptoms do not start to get better or if they get worse. You may need blood work done while you are taking this medication. This medication increases the risk of blood clots. People with heart, blood vessel, or blood clotting conditions are more likely to develop a blood clot. Other risk factors include advanced age, estrogen  use, tobacco use, lack of movement, and being overweight. This medication can decrease the response to a vaccine. If you need to get vaccinated, tell your care team if you have received this medication within the last year. Extra booster doses may be needed. Talk to your care team to see if a different  vaccination schedule is needed. If you have diabetes, you may get a falsely elevated blood sugar reading. Talk to your care team about how to check your blood sugar while taking this medication. What side effects may I notice from receiving this medication? Side effects that you should report to your care team as soon as possible: Allergic reactions--skin rash, itching, hives, swelling of the face, lips, tongue, or throat Blood clot--pain, swelling, or warmth in the leg, shortness of breath, chest pain Fever, neck pain or stiffness, sensitivity to light, headache, nausea, vomiting, confusion, which may be signs of meningitis Hemolytic anemia--unusual weakness or fatigue, dizziness, headache, trouble breathing, dark urine, yellowing skin or eyes Kidney injury--decrease in the amount of urine, swelling of the ankles, hands, or feet Low sodium level--muscle weakness, fatigue, dizziness, headache, confusion Shortness of breath or trouble breathing, cough, unusual weakness or fatigue, blue skin or lips Side effects that usually do not require medical attention (report these to your care team if they continue or are bothersome): Chills Diarrhea Fever Headache Nausea This list may not describe all possible side effects. Call your doctor for medical advice about side effects. You may report side effects to FDA at 1-800-FDA-1088. Where should I keep my medication? Keep out of the reach of children and pets. You will be instructed on how to store this medication. Get rid of any unused medication after the expiration date. To get rid of medications that are no longer needed or have expired: Take the medication to a medication take-back program. Check with your pharmacy or law enforcement to find a location. If you cannot return the medication, ask your pharmacist or care team how to get rid of this medication safely. NOTE: This sheet is a summary. It may not cover all possible information. If you have  questions about this medicine, talk to your doctor, pharmacist, or health care provider.  2024 Elsevier/Gold Standard (2023-11-17 00:00:00)

## 2024-11-18 ENCOUNTER — Inpatient Hospital Stay: Attending: Hematology and Oncology

## 2024-11-18 ENCOUNTER — Other Ambulatory Visit: Payer: Self-pay | Admitting: *Deleted

## 2024-11-18 DIAGNOSIS — D693 Immune thrombocytopenic purpura: Secondary | ICD-10-CM | POA: Diagnosis present

## 2024-11-18 DIAGNOSIS — D696 Thrombocytopenia, unspecified: Secondary | ICD-10-CM

## 2024-11-18 LAB — CBC WITH DIFFERENTIAL (CANCER CENTER ONLY)
Abs Immature Granulocytes: 0.02 K/uL (ref 0.00–0.07)
Basophils Absolute: 0 K/uL (ref 0.0–0.1)
Basophils Relative: 0 %
Eosinophils Absolute: 0 K/uL (ref 0.0–0.5)
Eosinophils Relative: 0 %
HCT: 42.1 % (ref 39.0–52.0)
Hemoglobin: 14.8 g/dL (ref 13.0–17.0)
Immature Granulocytes: 0 %
Lymphocytes Relative: 21 %
Lymphs Abs: 1.8 K/uL (ref 0.7–4.0)
MCH: 31 pg (ref 26.0–34.0)
MCHC: 35.2 g/dL (ref 30.0–36.0)
MCV: 88.1 fL (ref 80.0–100.0)
Monocytes Absolute: 0.6 K/uL (ref 0.1–1.0)
Monocytes Relative: 7 %
Neutro Abs: 6 K/uL (ref 1.7–7.7)
Neutrophils Relative %: 72 %
Platelet Count: 204 K/uL (ref 150–400)
RBC: 4.78 MIL/uL (ref 4.22–5.81)
RDW: 13.2 % (ref 11.5–15.5)
WBC Count: 8.5 K/uL (ref 4.0–10.5)
nRBC: 0 % (ref 0.0–0.2)

## 2024-12-15 ENCOUNTER — Other Ambulatory Visit: Payer: Self-pay | Admitting: Hematology and Oncology

## 2024-12-15 DIAGNOSIS — D696 Thrombocytopenia, unspecified: Secondary | ICD-10-CM

## 2024-12-15 NOTE — Progress Notes (Unsigned)
 " [facility] Telephone:([phone]   Fax:(336) [phone]  PROGRESS NOTE  Patient Care Team: Marvene Prentice SAUNDERS, FNP as PCP - General (Family Medicine)  Hematological/Oncological History # Autoimmune Thrombocytopenia/Neutropenia 04/01/2022: platelet transfusion for Plt of 8, WBC 2.2.  04/29/2022: bone marrow biopsy showed no evidence of underlying bone marrow disorder 05/31/2022: steroid taper that continued until Sept 2023.  10/25/2022: last visit with Dr. Amadeo 03/29/2023: transition care to Dr. Federico  04/07/2023: Dex 40 mg x 4 days pulse. Patient's counts responded.  05/09/2024: Plt <5 during GI illness. Admitted to hospital.  6/12-6/15/2025: Dex 40 mg x 4 days pulse due to 2nd drop.  6/24-6/25/2025: Patient received IVIG treatment 1 g/kg x 2 days.  Excellent response  Interval History:  Mark Duarte 43 y.o. male with medical history significant for autoimmune thrombocytopenia/neutropenia who presents for a follow up visit. The patient's last visit was on 10/21/2024. In the interim since the last visit he has had no major changes in his health.  On exam today Mr. Rosamond reports he is feeling better after completing the last round of IVIG and dexamethasone .  He reports he does have 1 bruise but has not been having major issues with bruising/bleeding.  He denies any nosebleeds, gum bleeding, or blood in the urine/stool.  He reports his skin is dry this season but his energy levels are good.  He does have some occasional sleepless nights.  Otherwise he reports he is not having any issues with fevers, chills, sweats, nausea, vomiting or diarrhea.  He reports he got his flu shot but did not get his COVID shot.  He did recently have a respiratory infection that the rest of his family had but he reports it was mild.  Overall he feels well and has no additional questions concerns or complaints today.  A full 10 point ROS is otherwise negative.  MEDICAL HISTORY:  Past Medical History:   Diagnosis Date   Allergy    Asthma     SURGICAL HISTORY: Past Surgical History:  Procedure Laterality Date   CYST REMOVAL TRUNK      SOCIAL HISTORY: Social History   Socioeconomic History   Marital status: Single    Spouse name: Not on file   Number of children: Not on file   Years of education: Not on file   Highest education level: Not on file  Occupational History   Not on file  Tobacco Use   Smoking status: Former    Current packs/day: 0.00    Types: Cigarettes    Quit date: 10/11/2013    Years since quitting: 11.1   Smokeless tobacco: Never  Vaping Use   Vaping status: Never Used  Substance and Sexual Activity   Alcohol use: Yes    Alcohol/week: 5.0 standard drinks of alcohol    Types: 5 Standard drinks or equivalent per week    Comment: 2 beers/day 5 days a week   Drug use: No   Sexual activity: Not on file  Other Topics Concern   Not on file  Social History Narrative   Not on file   Social Drivers of Health   Tobacco Use: Medium Risk (05/10/2024)   Patient History    Smoking Tobacco Use: Former    Smokeless Tobacco Use: Never    Passive Exposure: Not on file  Financial Resource Strain: Not on file  Food Insecurity: No Food Insecurity (05/09/2024)   Hunger Vital Sign    Worried About Programme Researcher, Broadcasting/film/video in  the Last Year: Never true    Ran Out of Food in the Last Year: Never true  Transportation Needs: No Transportation Needs (05/09/2024)   PRAPARE - Administrator, Civil Service (Medical): No    Lack of Transportation (Non-Medical): No  Physical Activity: Not on file  Stress: Not on file  Social Connections: Not on file  Intimate Partner Violence: Not At Risk (05/09/2024)   Humiliation, Afraid, Rape, and Kick questionnaire    Fear of Current or Ex-Partner: No    Emotionally Abused: No    Physically Abused: No    Sexually Abused: No  Depression (PHQ2-9): Low Risk (11/08/2024)   Depression (PHQ2-9)    PHQ-2 Score: 0  Alcohol Screen:  Not on file  Housing: Low Risk (05/09/2024)   Housing Stability Vital Sign    Unable to Pay for Housing in the Last Year: No    Number of Times Moved in the Last Year: 1    Homeless in the Last Year: No  Utilities: Not At Risk (05/09/2024)   AHC Utilities    Threatened with loss of utilities: No  Health Literacy: Not on file    FAMILY HISTORY: Family History  Problem Relation Age of Onset   Diabetes Mother    Hypertension Mother    High Cholesterol Mother    Hypertension Father    High Cholesterol Father    Cancer Maternal Grandmother    Heart disease Maternal Grandfather    Diabetes Maternal Grandfather    High Cholesterol Maternal Grandfather    Stroke Maternal Grandfather    Mental illness Maternal Grandfather     ALLERGIES:  has no known allergies.  MEDICATIONS:  Current Outpatient Medications  Medication Sig Dispense Refill   albuterol  (VENTOLIN  HFA) 108 (90 Base) MCG/ACT inhaler Inhale 2 puffs into the lungs every 4 (four) hours as needed for wheezing or shortness of breath (cough, shortness of breath or wheezing.). 1 each 1   dexamethasone  (DECADRON ) 4 MG tablet Take 10 tablets (40 mg total) by mouth daily. Take daily for 4 days. 40 tablet 0   Multiple Vitamins-Minerals (MULTIVITAL PO) Take 1 tablet by mouth daily.     No current facility-administered medications for this visit.    REVIEW OF SYSTEMS:   Constitutional: ( - ) fevers, ( - )  chills , ( - ) night sweats Eyes: ( - ) blurriness of vision, ( - ) double vision, ( - ) watery eyes Ears, nose, mouth, throat, and face: ( - ) mucositis, ( - ) sore throat Respiratory: ( - ) cough, ( - ) dyspnea, ( - ) wheezes Cardiovascular: ( - ) palpitation, ( - ) chest discomfort, ( - ) lower extremity swelling Gastrointestinal:  ( - ) nausea, ( - ) heartburn, ( - ) change in bowel habits Skin: ( - ) abnormal skin rashes Lymphatics: ( - ) new lymphadenopathy, ( - ) easy bruising Neurological: ( - ) numbness, ( - ) tingling,  ( - ) new weaknesses Behavioral/Psych: ( - ) mood change, ( - ) new changes  All other systems were reviewed with the patient and are negative.  PHYSICAL EXAMINATION:  Vitals:   12/16/24 0956  BP: 130/83  Pulse: 96  Resp: 18  Temp: (!) 97.4 F (36.3 C)  SpO2: 96%      Filed Weights   12/16/24 0956  Weight: 258 lb 3.2 oz (117.1 kg)     GENERAL: well-appearing middle-aged Caucasian male, alert, no distress and  comfortable SKIN: skin color, texture, turgor are normal, no rashes or significant lesions EYES: conjunctiva are pink and non-injected, sclera clear LUNGS: clear to auscultation and percussion with normal breathing effort HEART: regular rate & rhythm and no murmurs and no lower extremity edema Musculoskeletal: no cyanosis of digits and no clubbing  PSYCH: alert & oriented x 3, fluent speech NEURO: no focal motor/sensory deficits  LABORATORY DATA:  I have reviewed the data as listed    Latest Ref Rng & Units 12/16/2024    9:37 AM 11/18/2024   10:34 AM 11/07/2024    8:26 AM  CBC  WBC 4.0 - 10.5 K/uL 5.1  8.5  6.9   Hemoglobin 13.0 - 17.0 g/dL 85.0  85.1  85.8   Hematocrit 39.0 - 52.0 % 41.3  42.1  39.4   Platelets 150 - 400 K/uL 154  204  118        Latest Ref Rng & Units 12/16/2024    9:37 AM 11/07/2024    8:26 AM 05/10/2024    5:56 AM  CMP  Glucose 70 - 99 mg/dL 883  798  831   BUN 6 - 20 mg/dL 18  20  16    Creatinine 0.61 - 1.24 mg/dL 8.89  9.02  9.13   Sodium 135 - 145 mmol/L 140  138  138   Potassium 3.5 - 5.1 mmol/L 4.0  4.3  3.9   Chloride 98 - 111 mmol/L 105  104  109   CO2 22 - 32 mmol/L 22  21  23    Calcium 8.9 - 10.3 mg/dL 9.1  9.5  8.7   Total Protein 6.5 - 8.1 g/dL 7.5  6.6    Total Bilirubin 0.0 - 1.2 mg/dL 0.6  0.5    Alkaline Phos 38 - 126 U/L 61  69    AST 15 - 41 U/L 30  14    ALT 0 - 44 U/L 38  30      RADIOGRAPHIC STUDIES: No results found.   ASSESSMENT & PLAN ESTEFANO VICTORY 43 y.o. male with medical history significant  for autoimmune thrombocytopenia/neutropenia who presents for a follow up visit.   # Autoimmune Thrombocytopenia/Neutropenia -- Labs today show white blood cell count 5.1, Hgb 14.9, MCV 86.2, Plt 154   --added IVIG due to drop in Plt after initial rise from steroid boost.  Would consider this again if patient had further problems. -- received Dexamethasone  40 mg PO daily x 4 days with daily labs in May 2025  -- strict return precautions for bleeding, headache, fatigue, or shortness of breath.  --Responded well to Dex 40 mg x 4 days in the past, required IVIG most recently in Nov 2025 -- findings are consistent with autoimmune destruction of platelets/neutrophils. --We discussed other treatments including TPO oral/subcu, splenectomy, and further IVIG. -- RTC in 12 weeks with interval labs in 2 week, 4 weeks, and 8 weeks (labs with PCP due to insurance issues).   No orders of the defined types were placed in this encounter.   All questions were answered. The patient knows to call the clinic with any problems, questions or concerns.  A total of more than 30 minutes were spent on this encounter with face-to-face time and non-face-to-face time, including preparing to see the patient, ordering tests and/or medications, counseling the patient and coordination of care as outlined above.   Norleen IVAR Kidney, MD Department of Hematology/Oncology [facility] at [facility] Phone: [phone] Pager: [phone]  Email: [email or web address]  12/16/2024 2:16 PM "

## 2024-12-16 ENCOUNTER — Inpatient Hospital Stay

## 2024-12-16 ENCOUNTER — Inpatient Hospital Stay (HOSPITAL_BASED_OUTPATIENT_CLINIC_OR_DEPARTMENT_OTHER): Admitting: Hematology and Oncology

## 2024-12-16 VITALS — BP 130/83 | HR 96 | Temp 97.4°F | Resp 18 | Wt 258.2 lb

## 2024-12-16 DIAGNOSIS — D696 Thrombocytopenia, unspecified: Secondary | ICD-10-CM

## 2024-12-16 DIAGNOSIS — D709 Neutropenia, unspecified: Secondary | ICD-10-CM

## 2024-12-16 DIAGNOSIS — D693 Immune thrombocytopenic purpura: Secondary | ICD-10-CM | POA: Diagnosis not present

## 2024-12-16 LAB — CMP (CANCER CENTER ONLY)
ALT: 38 U/L (ref 0–44)
AST: 30 U/L (ref 15–41)
Albumin: 4.6 g/dL (ref 3.5–5.0)
Alkaline Phosphatase: 61 U/L (ref 38–126)
Anion gap: 13 (ref 5–15)
BUN: 18 mg/dL (ref 6–20)
CO2: 22 mmol/L (ref 22–32)
Calcium: 9.1 mg/dL (ref 8.9–10.3)
Chloride: 105 mmol/L (ref 98–111)
Creatinine: 1.1 mg/dL (ref 0.61–1.24)
GFR, Estimated: >60 mL/min
Glucose, Bld: 116 mg/dL — ABNORMAL HIGH (ref 70–99)
Potassium: 4 mmol/L (ref 3.5–5.1)
Sodium: 140 mmol/L (ref 135–145)
Total Bilirubin: 0.6 mg/dL (ref 0.0–1.2)
Total Protein: 7.5 g/dL (ref 6.5–8.1)

## 2024-12-16 LAB — CBC WITH DIFFERENTIAL (CANCER CENTER ONLY)
Abs Immature Granulocytes: 0.01 K/uL (ref 0.00–0.07)
Basophils Absolute: 0 K/uL (ref 0.0–0.1)
Basophils Relative: 0 %
Eosinophils Absolute: 0 K/uL (ref 0.0–0.5)
Eosinophils Relative: 0 %
HCT: 41.3 % (ref 39.0–52.0)
Hemoglobin: 14.9 g/dL (ref 13.0–17.0)
Immature Granulocytes: 0 %
Lymphocytes Relative: 30 %
Lymphs Abs: 1.5 K/uL (ref 0.7–4.0)
MCH: 31.1 pg (ref 26.0–34.0)
MCHC: 36.1 g/dL — ABNORMAL HIGH (ref 30.0–36.0)
MCV: 86.2 fL (ref 80.0–100.0)
Monocytes Absolute: 0.4 K/uL (ref 0.1–1.0)
Monocytes Relative: 8 %
Neutro Abs: 3.1 K/uL (ref 1.7–7.7)
Neutrophils Relative %: 62 %
Platelet Count: 154 K/uL (ref 150–400)
RBC: 4.79 MIL/uL (ref 4.22–5.81)
RDW: 13 % (ref 11.5–15.5)
WBC Count: 5.1 K/uL (ref 4.0–10.5)
nRBC: 0 % (ref 0.0–0.2)

## 2024-12-16 LAB — IMMATURE PLATELET FRACTION: Immature Platelet Fraction: 1.8 % (ref 1.2–8.6)

## 2024-12-17 ENCOUNTER — Telehealth: Payer: Self-pay | Admitting: Hematology and Oncology

## 2024-12-17 NOTE — Telephone Encounter (Signed)
 Scheduled patient for next appointment. Called and left a voicemail with the day and time.

## 2025-01-10 ENCOUNTER — Encounter: Payer: Self-pay | Admitting: Family Medicine

## 2025-03-17 ENCOUNTER — Inpatient Hospital Stay

## 2025-03-17 ENCOUNTER — Inpatient Hospital Stay: Admitting: Hematology and Oncology
